# Patient Record
Sex: Male | Born: 1955
Health system: Southern US, Community
[De-identification: ages and names within clinical notes are randomized; demographics above are authoritative.]

## PROBLEM LIST (undated history)

## (undated) DIAGNOSIS — G459 Transient cerebral ischemic attack, unspecified: Secondary | ICD-10-CM

## (undated) DIAGNOSIS — I2510 Atherosclerotic heart disease of native coronary artery without angina pectoris: Secondary | ICD-10-CM

## (undated) DIAGNOSIS — E785 Hyperlipidemia, unspecified: Secondary | ICD-10-CM

---

## 2006-07-06 ENCOUNTER — Encounter: Admission: RE | Admit: 2006-07-06 | Discharge: 2006-07-06 | Payer: Self-pay | Admitting: Family Medicine

## 2006-07-20 ENCOUNTER — Ambulatory Visit: Payer: Self-pay

## 2006-07-20 ENCOUNTER — Encounter: Payer: Self-pay | Admitting: Cardiology

## 2006-09-22 ENCOUNTER — Ambulatory Visit: Payer: Self-pay | Admitting: Cardiology

## 2006-10-13 ENCOUNTER — Ambulatory Visit: Payer: Self-pay | Admitting: Cardiology

## 2006-10-13 ENCOUNTER — Ambulatory Visit: Payer: Self-pay

## 2006-10-13 ENCOUNTER — Encounter: Payer: Self-pay | Admitting: Cardiology

## 2006-10-13 LAB — CONVERTED CEMR LAB
BUN: 12 mg/dL (ref 6–23)
Basophils Relative: 0.3 % (ref 0.0–1.0)
CO2: 33 meq/L — ABNORMAL HIGH (ref 19–32)
Creatinine, Ser: 1.3 mg/dL (ref 0.4–1.5)
HCT: 42.1 % (ref 39.0–52.0)
Hemoglobin: 15 g/dL (ref 13.0–17.0)
Monocytes Absolute: 0.4 10*3/uL (ref 0.2–0.7)
Neutrophils Relative %: 65.4 % (ref 43.0–77.0)
Potassium: 3.8 meq/L (ref 3.5–5.1)
RDW: 11.7 % (ref 11.5–14.6)
Sodium: 144 meq/L (ref 135–145)

## 2006-10-15 ENCOUNTER — Inpatient Hospital Stay (HOSPITAL_BASED_OUTPATIENT_CLINIC_OR_DEPARTMENT_OTHER): Admission: RE | Admit: 2006-10-15 | Discharge: 2006-10-15 | Payer: Self-pay | Admitting: Cardiovascular Disease

## 2006-10-15 ENCOUNTER — Ambulatory Visit: Payer: Self-pay | Admitting: Cardiovascular Disease

## 2006-11-03 ENCOUNTER — Ambulatory Visit: Payer: Self-pay | Admitting: Cardiology

## 2010-08-20 NOTE — Assessment & Plan Note (Signed)
[facility]                            CARDIOLOGY OFFICE NOTE   EDMON, MAGID                        MRN:          [id number]  DATE:10/13/2006                            DOB:          [birth year]    REASON FOR VISIT:  Follow up exercise echocardiogram.   HISTORY OF PRESENT ILLNESS:  I saw Mr. Reister back in June for  cardiovascular evaluation.  He is a 55 year old male with a family  history of premature cardiovascular disease, as well as personal history  of previous cerebral vascular disease, and hyperlipidemia.  He had no  major progressive symptomatology, but was referred for a baseline  cardiac risk assessment with an exercise echocardiogram.  This study was  performed earlier today, and interpreted by Dr. Myrtis Ser.  Results indicate  abnormal ST segment changes in late exercise including ST segment  depression, which was fairly short lived, essentially nondiagnostic by  30 seconds in recovery.  This was in the absence of chest pain.  A  hypertensive response was noted with a systolic of 182.  There is also  description of possible ST elevation in lead AVL, but this is fairly  equivocal.  The echocardiographic images were reported to show abnormal  septal motion with stress, and no clear decrease in chamber size with  activity.  I reviewed these results with the patient today in the  office.  He continues to deny any major problems with exertional chest  pain or dyspnea.  He reports only occasional indigestion.  In light of  these findings, we discussed proceeding on to a diagnostic cardiac  catheterization to clearly outline his coronary anatomy.  We discussed  the potential risks and benefits, and he is in agreement to proceed.  This will be scheduled at his earliest convenience, which at this point  he states is Thursday.   ALLERGIES:  NO KNOWN DRUG ALLERGIES.   PRESENT MEDICATIONS:  Include:  1. CoQ10.  2. Crestor 20 mg p.o. daily.  3.  Aspirin 81 mg 2 tablets p.o. daily.   REVIEW OF SYSTEMS:  As per history of present illness, otherwise  negative.   PAST MEDICAL HISTORY:  Reviewed in my previous note.  There has been  other major interval change..   SOCIAL HISTORY:  Reviewed in my previous note.  There has been no other  major interval change.   FAMILY HISTORY:  Reviewed in my previous note.  There has been no other  major interval change.   EXAMINATION:  Blood pressure is 139/87.  Heart rate is 62.  Weight is  187 pounds.  Patient is comfortable without any active chest pain or dyspnea.  There  has been no significant change in his examination compared to the  previous documentation.   IMPRESSION AND RECOMMENDATIONS:  1. Abnormal screening exercise echocardiogram in a 55 year old male      with family history of premature cardiovascular disease.  2. Hyperlipidemia.  3. Possible cerebrovascular disease based on previous testing.   We have discussed the issues, and plan is for a diagnostic cardiac  catheterization to clearly  outline the coronary anatomy and assess for  any potential revascularization options.  I reviewed the risks and  benefits with him, and he is in agreement to proceed.  We will plan  baseline blood work and a chest x-ray, and schedule cardiac  catheterization for this week at the patient's earliest convenience.  He  will continue his present medications, and we have provided him with a  prescription of sublingual nitroglycerin as well.  Further plans to  follow based on subsequent testing.     Jonelle Sidle, MD  Electronically Signed    SGM/MedQ  DD: 10/13/2006  DT: 10/13/2006  Job #: [id number]   cc:   Marye Round, M.D.

## 2010-08-20 NOTE — Assessment & Plan Note (Signed)
[facility]                            CARDIOLOGY OFFICE NOTE   Patrick [name], Patrick [name]                        MRN:          [id number]  DATE:09/22/2006                            DOB:          Feb 11, 1956    REFERRING PHYSICIAN:  Jeanice Lim, M.D.   REASON FOR CONSULTATION:  Cardiovascular evaluation.   HISTORY OF PRESENT ILLNESS:  Patrick [name] is a pleasant 55 year old male  with a history of possible cerebrovascular disease.  He apparently had  an event approximately 6 months ago where he had transient brief problem  with speech disturbance.  This led to a neurological evaluation which  reportedly showed evidence of a possible old stroke but nothing acute  and no major obstructive carotid stenoses.  He has been managed with  aspirin and lipid therapy and was noted to have significant  hyperlipidemia with a total cholesterol of 301, triglycerides of 513,  and ACL of 39.  He reports having a standard treadmill test as part of a  routine physical approximately 7 years ago when he was living in  [place], [place].  Symptomatically he denies having any exertional  chest pain or dyspnea.  He runs a few miles at a time once or twice a  week without problems and works out with [employer].  He has had no further  cardiac risk stratification and does have a family history of  cardiovascular disease including his father, who underwent bypass  surgery at age 74.   ALLERGIES:  No known drug allergies.   PRESENT MEDICATIONS:  1. Co-Q-10.  2. Crestor 20 mg p.o. daily.  3. Aspirin 81 mg 2 tabs p.o. daily.   PAST MEDICAL HISTORY:  1. As outlined above.  2. The patient is status post previous lithotripsy.   SOCIAL HISTORY:  The patient is going through a divorce at this time.  He has 3 children.  He works in [employer].  He has a  prior tobacco use history but quit in 1996, drinks approximately 21  beers a day.  Two caffeinated beverages a day  also.   REVIEW OF SYSTEMS:  As described in history of present illness.  All  other is negative.   EXAMINATION:  VITAL SIGNS:  Blood pressure today was elevated at 167/84.  Heart rate 52, weight 185 pounds; a well-nourished, well-developed male  in no acute distress.  HEENT:  Conjunctivae was normal.  Pharynx is clear.  NECK:  Supple.  No elevated jugular venous pressure or loud bruits.  No  thyromegaly is noted.  LUNGS:  Clear without labored breathing.  CARDIAC:  Regular rate and rhythm without murmur, S3 gallop, or  pericardial rub.  ABDOMEN:  Soft, nontender, normoactive bowel sounds.  EXTREMITIES:  No significant pitting edema.  SKIN:  Warm and dry.  Distal pulses are 2+.  MUSCULOSKELETAL:  No kyphosis is noted.  PSYCHIATRIC:  Alert and oriented x3.  Affect is normal.   IMPRESSION AND RECOMMENDATIONS:  36. A 55 year old male with hyperlipidemia, apparent cerebrovascular      disease based on the previous workup and  family history of      premature cardiovascular disease.  He is not manifesting active      symptoms, but he is at increased risk for cardiac event based on      his risk factor profile.  His last assessment was approximately 7      years ago with a standard treadmill test.  Our plan will be an      exercise echocardiogram for a repeat risk assessment.  If this is a      low risk study, I would continue aggressive risk factor      modification.  Otherwise, we will plan to see him back and discuss      further evaluation.  2. Further plans to follow.     Jonelle Sidle, MD  Electronically Signed    SGM/MedQ  DD: 09/22/2006  DT: 09/22/2006  Job #: [id number]   cc:   Jeanice Lim, M.D.

## 2010-08-20 NOTE — Cardiovascular Report (Signed)
NAME:  [name], [name] NO.:  [id number]   MEDICAL RECORD NO.:  [id number]          PATIENT TYPE:  OIB   LOCATION:  1962                         FACILITY:  [facility]   PHYSICIAN:  Veverly Fells. Excell Seltzer, MD  DATE OF BIRTH:  [birth year]   DATE OF PROCEDURE:  10/15/2006  DATE OF DISCHARGE:                            CARDIAC CATHETERIZATION   PROCEDURE:  Left heart catheterization, selective coronary angiography,  left ventricular angiography.   INDICATION:  Patrick [name] is a 55 year old gentleman who underwent  functional testing with an exercise echocardiogram.  He developed ST  depression with exercise that recovered quickly.  He did not have chest  pain.  There was a subtle abnormality in the septum with stress.  He was  referred for a cardiac catheterization in the setting of his abnormal  functional study.   Risks and indications of the procedure were explained to the patient.  Informed consent was obtained.  The right groin was prepped, draped and  anesthetized with 1% lidocaine.  Using modified Seldinger technique, a 4-  French sheath was placed in the right femoral artery.  Multiple views of  the left and right coronary arteries were taken using standard preformed  4-French catheters.  Following selective angiography an angled pigtail  catheter was inserted into the left ventricle and pressures were  recorded.  A left ventriculogram was performed and pullback across the  aortic valve was done.  All catheter exchanges were performed over a  guidewire.  There were no immediate complications.   FINDINGS:  Aortic pressure 120/68 with a mean of 91, left ventricular  pressure is 119/4.   CORONARY ANGIOGRAPHY:  The left mainstem is calcified.  There is no  significant angiographic disease.  It bifurcates into the LAD and left  circumflex.   The LAD is a large-caliber vessel that courses down and wraps around the  left ventricular apex.  The proximal and mid LAD is  calcified.  The  proximal LAD has a smooth 30-50% stenosis just beyond the first diagonal  branch.  The LAD then has diffuse nonobstructive disease throughout its  midportions with a focal calcified 30-40% stenosis in the mid vessel.  Down in the apical portion of the LAD there is a focal 70-80% stenosis,  but this is very distal in the vessel.  The first diagonal branch arises  from the very proximal aspect of the LAD and is a medium-size vessel  that has no significant angiographic disease.   The left circumflex is a medium to large-size vessel.  It has diffuse  moderate stenosis with calcification throughout the proximal and mid  aspect.  It appears in the range of 70%.  The circumflex is also  tortuous in this area.  There is a very small first OM branch, followed  by a small second OM branch.  There are two large left posterolateral  branches supplied by the circumflex.  At the bifurcation of the left  posterolateral branches, there is a 30% stenosis there.  There is no  significant angiographic disease in the branch vessels themselves.   The  right coronary artery is a small vessel.  By angiography it appears  to be no more than 2 mm in size.  There is diffuse disease involving the  proximal and midportions.  The most significant lesion in the vessel was  in the midportion and is 70-80%.  It is a focal lesion.  Further down  the vessel in the distal RCA there is a 50% stenosis.  The RCA distally  bifurcates into a PDA branch and posterior AV segment that supplies two  small posterolateral branches.   Left ventriculography demonstrates normal LV systolic function with no  mitral regurgitation.  The LVEF is 60%.   ASSESSMENT:  1. Moderate three-vessel coronary artery disease.  2. Normal left ventricular systolic function.   PLAN:  I will review the findings with Dr. Diona Browner.  I would favor  treatment with medical therapy at this point.  The diffuse nature of the  disease as  well as the fact that there are no critical lesions would  favor medical therapy, especially as the patient is minimally  symptomatic.  He was able to exercise for 12 minutes according to the  Bruce protocol and while he had some evidence of ischemia, he did not  have high-risk features of his stress test.  He is currently taking an  aspirin and statin, and I will take the liberty of starting him on a  beta blocker today with close follow-up by Dr. Diona Browner.      Veverly Fells. Excell Seltzer, MD  Electronically Signed     MDC/MEDQ  D:  10/15/2006  T:  10/15/2006  Job:  [id number]   cc:   Patrick Sidle, MD  Patrick Round, MD

## 2010-08-20 NOTE — Assessment & Plan Note (Signed)
[facility]                            CARDIOLOGY OFFICE NOTE   Patrick [name]                        MRN:          [id number]  DATE:11/03/2006                            DOB:          10/10/1955    PRIMARY CARE PHYSICIAN:  Jeanice Lim, M.D.   REASON FOR VISIT:  Follow-up coronary artery disease.   HISTORY OF PRESENT ILLNESS:  I referred Patrick [name] for a diagnostic  cardiac catheterization following his visit in early July.  He had  undergone an exercise echocardiogram  which was abnormal in the setting  of a family history of premature cerebrovascular disease as well as  hyperlipidemia and previous cerebrovascular accident.  He had not had  any symptoms of active angina, however.  Dr. Excell Seltzer performed his  procedure on October 15, 2006, and this revealed moderate multivessel  coronary artery disease, although there were no critical stenoses  requiring urgent intervention, particularly in light of his asymptomatic  status.  He had no significant left main disease, 30-50% stenosis within  the left anterior descending followed by 30-40% stenosis and a focal  area of 70-80% stenosis in the apical portion of the vessel.  The  circumflex vessel had disease up to approximately 70% and the right  coronary artery had a 70-80% stenosis that was quite focal, otherwise  50% disease.  It was a relatively small vessel in caliber.  Ejection  fraction was 60%.  I reviewed these findings with the patient again  today.  He states that overall he has done fairly well.  He has noticed  more fatigue and exercise limitations since being started on metoprolol.  His resting heart rate before this medication was in the 60s and now it  is in the low 50s.  I reviewed risk factor modification, particularly  focused on antiplatelet therapy and lipid control.  He is due to have  follow-up lipids and liver function tests in a few months with his  primary care Patrick [name].   ALLERGIES:  NO KNOWN DRUG ALLERGIES.   PRESENT MEDICATIONS:  1. Aspirin 81 mg p.o. b.i.d.  2. Crestor 20 mg p.o. daily.  3. Co-Q 10 supplements.  4. Metoprolol 25 mg p.o. b.i.d.  5. Sublingual nitroglycerin 0.4 mg p.r.n.   REVIEW OF SYSTEMS:  As described in history of present illness.   PHYSICAL EXAMINATION:  VITAL SIGNS:  Blood pressure 122/69, heart rate  52, weight 187 pounds.  GENERAL APPEARANCE:  Patient is comfortable, appropriately nourished, in  no acute distress.  NECK:  No elevated jugular venous pressure, no bruits, no thyromegaly is  noted.  LUNGS:  Clear without labored breathing at rest.  CARDIOVASCULAR:  Regular rate and rhythm, no loud murmur or gallop.  EXTREMITIES:  No significant pitting or edema.   IMPRESSION/RECOMMENDATIONS:  1. Moderate multivessel coronary artery disease with preserved      ejection fraction.  Plan at this point is medical therapy,      particularly in light of no major symptoms of angina or dyspnea on      exertion.  I have asked  that he wean back his metoprolol to 12.5 mg      p.o. b.i.d. for a few days and then discontinue the medicine all      together.  He will otherwise continue aspirin and Crestor.  We also      talked about Omega III supplements.  I would recommend that he have      a follow-up lipid panel with liver function tests over the next      eight weeks and aim for LDL control around 70.  Otherwise, I would      like to see him back over the next six months for symptom review.  2. Further plans to follow.     Jonelle Sidle, MD  Electronically Signed    SGM/MedQ  DD: 11/03/2006  DT: 11/04/2006  Job #: [id number]   cc:   Patrick [name], M.D.

## 2016-01-05 ENCOUNTER — Emergency Department (HOSPITAL_COMMUNITY): Payer: Managed Care, Other (non HMO)

## 2016-01-05 ENCOUNTER — Inpatient Hospital Stay (HOSPITAL_COMMUNITY): Payer: Managed Care, Other (non HMO)

## 2016-01-05 ENCOUNTER — Inpatient Hospital Stay (HOSPITAL_COMMUNITY)
Admission: EM | Admit: 2016-01-05 | Discharge: 2016-01-22 | DRG: 082 | Disposition: A | Discharge status: Rehabilitation Facility | Payer: Managed Care, Other (non HMO) | Attending: Surgery | Admitting: Surgery

## 2016-01-05 ENCOUNTER — Encounter (HOSPITAL_COMMUNITY): Payer: Self-pay | Admitting: Emergency Medicine

## 2016-01-05 DIAGNOSIS — S02119A Unspecified fracture of occiput, initial encounter for closed fracture: Secondary | ICD-10-CM | POA: Diagnosis present

## 2016-01-05 DIAGNOSIS — Z7982 Long term (current) use of aspirin: Secondary | ICD-10-CM

## 2016-01-05 DIAGNOSIS — Y908 Blood alcohol level of 240 mg/100 ml or more: Secondary | ICD-10-CM | POA: Diagnosis present

## 2016-01-05 DIAGNOSIS — G9389 Other specified disorders of brain: Secondary | ICD-10-CM | POA: Diagnosis present

## 2016-01-05 DIAGNOSIS — F10129 Alcohol abuse with intoxication, unspecified: Secondary | ICD-10-CM | POA: Diagnosis present

## 2016-01-05 DIAGNOSIS — G936 Cerebral edema: Secondary | ICD-10-CM | POA: Diagnosis present

## 2016-01-05 DIAGNOSIS — S02113A Unspecified occipital condyle fracture, initial encounter for closed fracture: Secondary | ICD-10-CM | POA: Diagnosis present

## 2016-01-05 DIAGNOSIS — G510 Bell's palsy: Secondary | ICD-10-CM | POA: Diagnosis not present

## 2016-01-05 DIAGNOSIS — S02109A Fracture of base of skull, unspecified side, initial encounter for closed fracture: Secondary | ICD-10-CM | POA: Diagnosis present

## 2016-01-05 DIAGNOSIS — S066X9A Traumatic subarachnoid hemorrhage with loss of consciousness of unspecified duration, initial encounter: Secondary | ICD-10-CM | POA: Diagnosis present

## 2016-01-05 DIAGNOSIS — S12201A Unspecified nondisplaced fracture of third cervical vertebra, initial encounter for closed fracture: Secondary | ICD-10-CM | POA: Diagnosis present

## 2016-01-05 DIAGNOSIS — Z23 Encounter for immunization: Secondary | ICD-10-CM

## 2016-01-05 DIAGNOSIS — Z8673 Personal history of transient ischemic attack (TIA), and cerebral infarction without residual deficits: Secondary | ICD-10-CM

## 2016-01-05 DIAGNOSIS — S062X9A Diffuse traumatic brain injury with loss of consciousness of unspecified duration, initial encounter: Secondary | ICD-10-CM | POA: Diagnosis not present

## 2016-01-05 DIAGNOSIS — E785 Hyperlipidemia, unspecified: Secondary | ICD-10-CM | POA: Diagnosis present

## 2016-01-05 DIAGNOSIS — Z87891 Personal history of nicotine dependence: Secondary | ICD-10-CM | POA: Diagnosis not present

## 2016-01-05 DIAGNOSIS — G960 Cerebrospinal fluid leak: Secondary | ICD-10-CM | POA: Diagnosis not present

## 2016-01-05 DIAGNOSIS — I2510 Atherosclerotic heart disease of native coronary artery without angina pectoris: Secondary | ICD-10-CM | POA: Diagnosis present

## 2016-01-05 DIAGNOSIS — M542 Cervicalgia: Secondary | ICD-10-CM

## 2016-01-05 DIAGNOSIS — Z809 Family history of malignant neoplasm, unspecified: Secondary | ICD-10-CM

## 2016-01-05 DIAGNOSIS — S022XXA Fracture of nasal bones, initial encounter for closed fracture: Secondary | ICD-10-CM | POA: Diagnosis present

## 2016-01-05 DIAGNOSIS — S02609A Fracture of mandible, unspecified, initial encounter for closed fracture: Secondary | ICD-10-CM | POA: Diagnosis present

## 2016-01-05 DIAGNOSIS — G441 Vascular headache, not elsewhere classified: Secondary | ICD-10-CM | POA: Diagnosis not present

## 2016-01-05 DIAGNOSIS — S12501A Unspecified nondisplaced fracture of sixth cervical vertebra, initial encounter for closed fracture: Secondary | ICD-10-CM | POA: Diagnosis present

## 2016-01-05 DIAGNOSIS — G519 Disorder of facial nerve, unspecified: Secondary | ICD-10-CM | POA: Diagnosis not present

## 2016-01-05 DIAGNOSIS — W109XXA Fall (on) (from) unspecified stairs and steps, initial encounter: Secondary | ICD-10-CM | POA: Diagnosis present

## 2016-01-05 DIAGNOSIS — W19XXXA Unspecified fall, initial encounter: Secondary | ICD-10-CM | POA: Diagnosis present

## 2016-01-05 DIAGNOSIS — S0292XA Unspecified fracture of facial bones, initial encounter for closed fracture: Secondary | ICD-10-CM | POA: Diagnosis present

## 2016-01-05 DIAGNOSIS — S02109S Fracture of base of skull, unspecified side, sequela: Secondary | ICD-10-CM | POA: Diagnosis not present

## 2016-01-05 DIAGNOSIS — Z8249 Family history of ischemic heart disease and other diseases of the circulatory system: Secondary | ICD-10-CM

## 2016-01-05 DIAGNOSIS — G9601 Cranial cerebrospinal fluid leak, spontaneous: Secondary | ICD-10-CM

## 2016-01-05 DIAGNOSIS — S0990XA Unspecified injury of head, initial encounter: Secondary | ICD-10-CM | POA: Diagnosis present

## 2016-01-05 DIAGNOSIS — S066X1A Traumatic subarachnoid hemorrhage with loss of consciousness of 30 minutes or less, initial encounter: Secondary | ICD-10-CM

## 2016-01-05 DIAGNOSIS — R269 Unspecified abnormalities of gait and mobility: Secondary | ICD-10-CM

## 2016-01-05 DIAGNOSIS — S02101S Fracture of base of skull, right side, sequela: Secondary | ICD-10-CM | POA: Diagnosis not present

## 2016-01-05 DIAGNOSIS — S069X1A Unspecified intracranial injury with loss of consciousness of 30 minutes or less, initial encounter: Secondary | ICD-10-CM | POA: Diagnosis not present

## 2016-01-05 DIAGNOSIS — F1010 Alcohol abuse, uncomplicated: Secondary | ICD-10-CM | POA: Diagnosis present

## 2016-01-05 DIAGNOSIS — I609 Nontraumatic subarachnoid hemorrhage, unspecified: Secondary | ICD-10-CM

## 2016-01-05 DIAGNOSIS — R52 Pain, unspecified: Secondary | ICD-10-CM | POA: Diagnosis not present

## 2016-01-05 HISTORY — DX: Transient cerebral ischemic attack, unspecified: G45.9

## 2016-01-05 HISTORY — DX: Hyperlipidemia, unspecified: E78.5

## 2016-01-05 HISTORY — DX: Atherosclerotic heart disease of native coronary artery without angina pectoris: I25.10

## 2016-01-05 LAB — CBC WITH DIFFERENTIAL/PLATELET
BASOS ABS: 0 10*3/uL (ref 0.0–0.1)
Basophils Relative: 0 %
EOS ABS: 0.1 10*3/uL (ref 0.0–0.7)
EOS PCT: 1 %
HCT: 43.6 % (ref 39.0–52.0)
Hemoglobin: 14.3 g/dL (ref 13.0–17.0)
LYMPHS PCT: 39 %
Lymphs Abs: 2.7 10*3/uL (ref 0.7–4.0)
MCH: 29.9 pg (ref 26.0–34.0)
MCHC: 32.8 g/dL (ref 30.0–36.0)
MCV: 91.2 fL (ref 78.0–100.0)
MONO ABS: 0.4 10*3/uL (ref 0.1–1.0)
Monocytes Relative: 5 %
Neutro Abs: 3.8 10*3/uL (ref 1.7–7.7)
Neutrophils Relative %: 55 %
PLATELETS: 178 10*3/uL (ref 150–400)
RBC: 4.78 MIL/uL (ref 4.22–5.81)
RDW: 12.9 % (ref 11.5–15.5)
WBC: 6.9 10*3/uL (ref 4.0–10.5)

## 2016-01-05 LAB — COMPREHENSIVE METABOLIC PANEL
ALT: 16 U/L — ABNORMAL LOW (ref 17–63)
ANION GAP: 8 (ref 5–15)
AST: 25 U/L (ref 15–41)
Albumin: 4.4 g/dL (ref 3.5–5.0)
Alkaline Phosphatase: 46 U/L (ref 38–126)
BUN: 13 mg/dL (ref 6–20)
CHLORIDE: 111 mmol/L (ref 101–111)
CO2: 23 mmol/L (ref 22–32)
Calcium: 8.8 mg/dL — ABNORMAL LOW (ref 8.9–10.3)
Creatinine, Ser: 0.99 mg/dL (ref 0.61–1.24)
Glucose, Bld: 162 mg/dL — ABNORMAL HIGH (ref 65–99)
POTASSIUM: 3.7 mmol/L (ref 3.5–5.1)
SODIUM: 142 mmol/L (ref 135–145)
TOTAL PROTEIN: 7.1 g/dL (ref 6.5–8.1)
Total Bilirubin: 0.7 mg/dL (ref 0.3–1.2)

## 2016-01-05 LAB — URINALYSIS, ROUTINE W REFLEX MICROSCOPIC
BILIRUBIN URINE: NEGATIVE
GLUCOSE, UA: NEGATIVE mg/dL
Hgb urine dipstick: NEGATIVE
KETONES UR: NEGATIVE mg/dL
Leukocytes, UA: NEGATIVE
Nitrite: NEGATIVE
PH: 6 (ref 5.0–8.0)
PROTEIN: NEGATIVE mg/dL
Specific Gravity, Urine: 1.011 (ref 1.005–1.030)

## 2016-01-05 LAB — MRSA PCR SCREENING: MRSA by PCR: NEGATIVE

## 2016-01-05 LAB — ETHANOL: ALCOHOL ETHYL (B): 285 mg/dL — AB (ref <5)

## 2016-01-05 MED ORDER — ONDANSETRON HCL 4 MG/2ML IJ SOLN
4.0000 mg | Freq: Once | INTRAMUSCULAR | Status: AC
  Administered 2016-01-05: 4 mg via INTRAVENOUS
  Filled 2016-01-05: qty 2

## 2016-01-05 MED ORDER — ONDANSETRON HCL 4 MG/2ML IJ SOLN
4.0000 mg | Freq: Four times a day (QID) | INTRAMUSCULAR | Status: DC | PRN
  Administered 2016-01-05 – 2016-01-06 (×2): 4 mg via INTRAVENOUS
  Filled 2016-01-05 (×3): qty 2

## 2016-01-05 MED ORDER — MORPHINE SULFATE (PF) 2 MG/ML IV SOLN
2.0000 mg | INTRAVENOUS | Status: DC | PRN
  Administered 2016-01-05: 2 mg via INTRAVENOUS
  Administered 2016-01-05 (×3): 4 mg via INTRAVENOUS
  Administered 2016-01-05 (×2): 2 mg via INTRAVENOUS
  Administered 2016-01-06 – 2016-01-07 (×14): 4 mg via INTRAVENOUS
  Filled 2016-01-05 (×5): qty 2
  Filled 2016-01-05: qty 1
  Filled 2016-01-05 (×3): qty 2
  Filled 2016-01-05 (×2): qty 1
  Filled 2016-01-05 (×9): qty 2

## 2016-01-05 MED ORDER — ONDANSETRON HCL 4 MG/2ML IJ SOLN
INTRAMUSCULAR | Status: AC
  Administered 2016-01-05: 4 mg
  Filled 2016-01-05: qty 2

## 2016-01-05 MED ORDER — POTASSIUM CHLORIDE IN NACL 20-0.9 MEQ/L-% IV SOLN
INTRAVENOUS | Status: DC
  Administered 2016-01-05 – 2016-01-09 (×7): via INTRAVENOUS
  Filled 2016-01-05 (×9): qty 1000

## 2016-01-05 MED ORDER — FENTANYL CITRATE (PF) 100 MCG/2ML IJ SOLN
25.0000 ug | Freq: Once | INTRAMUSCULAR | Status: AC
  Administered 2016-01-05: 25 ug via INTRAVENOUS
  Filled 2016-01-05: qty 2

## 2016-01-05 MED ORDER — ONDANSETRON HCL 4 MG PO TABS: 4.0000 mg | ORAL_TABLET | Freq: Four times a day (QID) | ORAL | Status: DC | PRN

## 2016-01-05 NOTE — Consult Note (Signed)
Reason for Consult:Facial fractures Referring Physician: Dr. Verita Lamb  Patrick [name] is an 60 y.o. male.  HPI: The patient is a 60 yrs old wm here with his wife after a fall down the steps.  He is reported to have been intoxicated and fell on his face.  He was brought to the ED last night.  He is now in the ICU under neurosurgical evaluation.  He has marked bruising and swelling of his periorbital area.  He is sleepy but responds appropriately.  EOM intact. Denies malocclusion or face pain.  CT reviewed and includes palate and TMJ fractures with maxillary sinus.  Past Medical History:  Diagnosis Date  . Hyperlipidemia     History reviewed. No pertinent surgical history.  No family history on file.  Social History:  reports that he has never smoked. He has never used smokeless tobacco. He reports that he drinks alcohol. He reports that he does not use drugs.  ROS: per wife  Allergies: No Known Allergies  Medications: I have reviewed the patient's current medications.  Results for orders placed or performed during the hospital encounter of 01/05/16 (from the past 48 hour(s))  Comprehensive metabolic panel     Status: Abnormal   Collection Time: 01/05/16  2:44 AM  Result Value Ref Range   Sodium 142 135 - 145 mmol/L   Potassium 3.7 3.5 - 5.1 mmol/L   Chloride 111 101 - 111 mmol/L   CO2 23 22 - 32 mmol/L   Glucose, Bld 162 (H) 65 - 99 mg/dL   BUN 13 6 - 20 mg/dL   Creatinine, Ser 0.99 0.61 - 1.24 mg/dL   Calcium 8.8 (L) 8.9 - 10.3 mg/dL   Total Protein 7.1 6.5 - 8.1 g/dL   Albumin 4.4 3.5 - 5.0 g/dL   AST 25 15 - 41 U/L   ALT 16 (L) 17 - 63 U/L   Alkaline Phosphatase 46 38 - 126 U/L   Total Bilirubin 0.7 0.3 - 1.2 mg/dL   GFR calc non Af Amer >60 >60 mL/min   GFR calc Af Amer >60 >60 mL/min    Comment: (NOTE) The eGFR has been calculated using the CKD EPI equation. This calculation has not been validated in all clinical situations. eGFR's persistently <60 mL/min  signify possible Chronic Kidney Disease.    Anion gap 8 5 - 15  CBC with Differential     Status: None   Collection Time: 01/05/16  2:44 AM  Result Value Ref Range   WBC 6.9 4.0 - 10.5 K/uL   RBC 4.78 4.22 - 5.81 MIL/uL   Hemoglobin 14.3 13.0 - 17.0 g/dL   HCT 43.6 39.0 - 52.0 %   MCV 91.2 78.0 - 100.0 fL   MCH 29.9 26.0 - 34.0 pg   MCHC 32.8 30.0 - 36.0 g/dL   RDW 12.9 11.5 - 15.5 %   Platelets 178 150 - 400 K/uL   Neutrophils Relative % 55 %   Neutro Abs 3.8 1.7 - 7.7 K/uL   Lymphocytes Relative 39 %   Lymphs Abs 2.7 0.7 - 4.0 K/uL   Monocytes Relative 5 %   Monocytes Absolute 0.4 0.1 - 1.0 K/uL   Eosinophils Relative 1 %   Eosinophils Absolute 0.1 0.0 - 0.7 K/uL   Basophils Relative 0 %   Basophils Absolute 0.0 0.0 - 0.1 K/uL  Ethanol     Status: Abnormal   Collection Time: 01/05/16  2:44 AM  Result Value Ref Range  Alcohol, Ethyl (B) 285 (H) <5 mg/dL    Comment:        LOWEST DETECTABLE LIMIT FOR SERUM ALCOHOL IS 5 mg/dL FOR MEDICAL PURPOSES ONLY   Urinalysis, Routine w reflex microscopic     Status: None   Collection Time: 01/05/16  2:48 AM  Result Value Ref Range   Color, Urine YELLOW YELLOW   APPearance CLEAR CLEAR   Specific Gravity, Urine 1.011 1.005 - 1.030   pH 6.0 5.0 - 8.0   Glucose, UA NEGATIVE NEGATIVE mg/dL   Hgb urine dipstick NEGATIVE NEGATIVE   Bilirubin Urine NEGATIVE NEGATIVE   Ketones, ur NEGATIVE NEGATIVE mg/dL   Protein, ur NEGATIVE NEGATIVE mg/dL   Nitrite NEGATIVE NEGATIVE   Leukocytes, UA NEGATIVE NEGATIVE    Comment: MICROSCOPIC NOT DONE ON URINES WITH NEGATIVE PROTEIN, BLOOD, LEUKOCYTES, NITRITE, OR GLUCOSE <1000 mg/dL.    Ct Head Wo Contrast  Result Date: 01/05/2016 CLINICAL DATA:  Trip and fall injury down steps. Injuries to the head. Increasingly lethargic since the fall. Alcohol use. EXAM: CT HEAD WITHOUT CONTRAST CT MAXILLOFACIAL WITHOUT CONTRAST CT CERVICAL SPINE WITHOUT CONTRAST TECHNIQUE: Multidetector CT imaging of the head,  cervical spine, and maxillofacial structures were performed using the standard protocol without intravenous contrast. Multiplanar CT image reconstructions of the cervical spine and maxillofacial structures were also generated. COMPARISON:  None. FINDINGS: CT HEAD FINDINGS Brain: Pneumocephalus in the skullbase. Acute subarachnoid hemorrhage filling suprasellar cisterns and anterior basal subarachnoid spaces. Can't exclude small intraparenchymal hematomas around the periphery. No mass effect or midline shift. Gray-white matter junctions are distinct. Basal cisterns are not effaced. Ventricles are not dilated. Vascular: No hyperdense vessel or unexpected calcification. Skull: Mildly depressed basal skull fractures involving the greater wing of the sphenoid bone extending to the anterior temporal bone at the temporomandibular joint. Fractures across the base of the clivus extending from right to left. Non depressed fractures of the left sphenoid bone involving the greater wing, extending to the sphenoid sinus and to the temporomandibular joint. Calvarium appears intact. Sinuses/Orbits: See below. Mastoid air cells are not opacified. External and middle ear cavities are not opacified. Other: Examination is technically limited due to significant motion artifact throughout the examination. CT MAXILLOFACIAL FINDINGS Osseous: Depressed anterior nasal bone fractures. Fractures of the medial orbital walls bilaterally and of the left superior and inferior orbital walls. Fractures of the lateral, anterior, and inferior maxillary antral walls bilaterally. Fractures of the pterygoid plates on the right. Displaced fractures of the posterior nasal septum. Fracture of the right hard palate and maxilla. Mandibles and temporomandibular joints appear intact. Orbits: Globes and extraocular muscles appear intact and symmetrical. The no evidence of herniation or entrapment of the extraocular muscles. Bilateral periorbital hematomas and  emphysematous changes. Bilateral extraconal retrobulbar emphysema, greater on the left. Sinuses: Opacification of the left frontal, bilateral ethmoid, bilateral sphenoid, and bilateral maxillary sinuses. Soft tissues: Prominent soft tissue swelling and subcutaneous emphysema in the facial regions inferior to the orbits and along the cheeks bilaterally. Soft tissue emphysema demonstrated in the masticator muscle spaces and along the right mandible. Limited intracranial: See above. Other:  Examination is limited due to motion artifact. CT CERVICAL SPINE FINDINGS Alignment: Normal Skull base and vertebrae: Fractures of the skullbase as previously discussed. No vertebral compression deformities. Ununited ossicles are demonstrated at the anterior inferior endplate of C3 and at the anterior superior endplate of C6 which are not definitely corticated and could represent corner fractures. The posterior elements appear intact. C1-2 articulation appears intact.  Soft tissues and spinal canal: Gas bubbles demonstrated within the central canal at the level of C2 through C5. This likely represents gas within the CSF space, tracking down from the intracranial contents. No prevertebral soft tissue swelling. No central canal hematoma demonstrated. Disc levels: Mild degenerative changes in the cervical spine with mild endplate hypertrophic changes present. Upper chest: Emphysematous changes in the lung apices. Other: Examination is limited due to motion artifact. IMPRESSION: CT head: Acute intracranial subarachnoid and possibly intraparenchymal hemorrhages along the anterior frontal region. Acute hemorrhage in the suprasellar cisterns. Pneumocephalus. No mass-effect or midline shift. CT maxillary: Fractures as described throughout the skull base involving the sphenoid bones, clivus, and temporomandibular joints. Fractures of bilateral orbits and maxillary antral walls as described. Fractures also involve the pterygoid plates on the  right. Associated soft tissue hematomas and soft tissue emphysema in the orbital and facial regions as described. CT cervical spine: Normal alignment. No acute displaced fractures, but cannot exclude anterior superior endplate corner fracture at C6 and anterior inferior endplate on a fracture at C3. Gas within the spinal canal, probably within the CSF space. These results were called by telephone at the time of interpretation on 01/05/2016 at 3:49 am to Dr. Quintella Reichert , who verbally acknowledged these results. Electronically Signed   By: Lucienne Capers M.D.   On: 01/05/2016 04:09   Ct Cervical Spine Wo Contrast  Result Date: 01/05/2016 CLINICAL DATA:  Trip and fall injury down steps. Injuries to the head. Increasingly lethargic since the fall. Alcohol use. EXAM: CT HEAD WITHOUT CONTRAST CT MAXILLOFACIAL WITHOUT CONTRAST CT CERVICAL SPINE WITHOUT CONTRAST TECHNIQUE: Multidetector CT imaging of the head, cervical spine, and maxillofacial structures were performed using the standard protocol without intravenous contrast. Multiplanar CT image reconstructions of the cervical spine and maxillofacial structures were also generated. COMPARISON:  None. FINDINGS: CT HEAD FINDINGS Brain: Pneumocephalus in the skullbase. Acute subarachnoid hemorrhage filling suprasellar cisterns and anterior basal subarachnoid spaces. Can't exclude small intraparenchymal hematomas around the periphery. No mass effect or midline shift. Gray-white matter junctions are distinct. Basal cisterns are not effaced. Ventricles are not dilated. Vascular: No hyperdense vessel or unexpected calcification. Skull: Mildly depressed basal skull fractures involving the greater wing of the sphenoid bone extending to the anterior temporal bone at the temporomandibular joint. Fractures across the base of the clivus extending from right to left. Non depressed fractures of the left sphenoid bone involving the greater wing, extending to the sphenoid sinus  and to the temporomandibular joint. Calvarium appears intact. Sinuses/Orbits: See below. Mastoid air cells are not opacified. External and middle ear cavities are not opacified. Other: Examination is technically limited due to significant motion artifact throughout the examination. CT MAXILLOFACIAL FINDINGS Osseous: Depressed anterior nasal bone fractures. Fractures of the medial orbital walls bilaterally and of the left superior and inferior orbital walls. Fractures of the lateral, anterior, and inferior maxillary antral walls bilaterally. Fractures of the pterygoid plates on the right. Displaced fractures of the posterior nasal septum. Fracture of the right hard palate and maxilla. Mandibles and temporomandibular joints appear intact. Orbits: Globes and extraocular muscles appear intact and symmetrical. The no evidence of herniation or entrapment of the extraocular muscles. Bilateral periorbital hematomas and emphysematous changes. Bilateral extraconal retrobulbar emphysema, greater on the left. Sinuses: Opacification of the left frontal, bilateral ethmoid, bilateral sphenoid, and bilateral maxillary sinuses. Soft tissues: Prominent soft tissue swelling and subcutaneous emphysema in the facial regions inferior to the orbits and along the cheeks bilaterally. Soft tissue emphysema  demonstrated in the masticator muscle spaces and along the right mandible. Limited intracranial: See above. Other:  Examination is limited due to motion artifact. CT CERVICAL SPINE FINDINGS Alignment: Normal Skull base and vertebrae: Fractures of the skullbase as previously discussed. No vertebral compression deformities. Ununited ossicles are demonstrated at the anterior inferior endplate of C3 and at the anterior superior endplate of C6 which are not definitely corticated and could represent corner fractures. The posterior elements appear intact. C1-2 articulation appears intact. Soft tissues and spinal canal: Gas bubbles demonstrated  within the central canal at the level of C2 through C5. This likely represents gas within the CSF space, tracking down from the intracranial contents. No prevertebral soft tissue swelling. No central canal hematoma demonstrated. Disc levels: Mild degenerative changes in the cervical spine with mild endplate hypertrophic changes present. Upper chest: Emphysematous changes in the lung apices. Other: Examination is limited due to motion artifact. IMPRESSION: CT head: Acute intracranial subarachnoid and possibly intraparenchymal hemorrhages along the anterior frontal region. Acute hemorrhage in the suprasellar cisterns. Pneumocephalus. No mass-effect or midline shift. CT maxillary: Fractures as described throughout the skull base involving the sphenoid bones, clivus, and temporomandibular joints. Fractures of bilateral orbits and maxillary antral walls as described. Fractures also involve the pterygoid plates on the right. Associated soft tissue hematomas and soft tissue emphysema in the orbital and facial regions as described. CT cervical spine: Normal alignment. No acute displaced fractures, but cannot exclude anterior superior endplate corner fracture at C6 and anterior inferior endplate on a fracture at C3. Gas within the spinal canal, probably within the CSF space. These results were called by telephone at the time of interpretation on 01/05/2016 at 3:49 am to Dr. Quintella Reichert , who verbally acknowledged these results. Electronically Signed   By: Lucienne Capers M.D.   On: 01/05/2016 04:09   Dg Chest Port 1 View  Result Date: 01/05/2016 CLINICAL DATA:  Altered mental status after fall down stairs. Alcohol use. EXAM: PORTABLE CHEST 1 VIEW COMPARISON:  None. FINDINGS: Shallow inspiration. Borderline heart size with normal pulmonary vascularity, likely normal for technique. No blunting of costophrenic angles. No pneumothorax. No focal airspace disease or consolidation in the lungs. Calcified and tortuous aorta.  IMPRESSION: Shallow inspiration.  No evidence of active pulmonary disease. Electronically Signed   By: Lucienne Capers M.D.   On: 01/05/2016 03:10   Ct Maxillofacial Wo Cm  Result Date: 01/05/2016 CLINICAL DATA:  Trip and fall injury down steps. Injuries to the head. Increasingly lethargic since the fall. Alcohol use. EXAM: CT HEAD WITHOUT CONTRAST CT MAXILLOFACIAL WITHOUT CONTRAST CT CERVICAL SPINE WITHOUT CONTRAST TECHNIQUE: Multidetector CT imaging of the head, cervical spine, and maxillofacial structures were performed using the standard protocol without intravenous contrast. Multiplanar CT image reconstructions of the cervical spine and maxillofacial structures were also generated. COMPARISON:  None. FINDINGS: CT HEAD FINDINGS Brain: Pneumocephalus in the skullbase. Acute subarachnoid hemorrhage filling suprasellar cisterns and anterior basal subarachnoid spaces. Can't exclude small intraparenchymal hematomas around the periphery. No mass effect or midline shift. Gray-white matter junctions are distinct. Basal cisterns are not effaced. Ventricles are not dilated. Vascular: No hyperdense vessel or unexpected calcification. Skull: Mildly depressed basal skull fractures involving the greater wing of the sphenoid bone extending to the anterior temporal bone at the temporomandibular joint. Fractures across the base of the clivus extending from right to left. Non depressed fractures of the left sphenoid bone involving the greater wing, extending to the sphenoid sinus and to the temporomandibular joint.  Calvarium appears intact. Sinuses/Orbits: See below. Mastoid air cells are not opacified. External and middle ear cavities are not opacified. Other: Examination is technically limited due to significant motion artifact throughout the examination. CT MAXILLOFACIAL FINDINGS Osseous: Depressed anterior nasal bone fractures. Fractures of the medial orbital walls bilaterally and of the left superior and inferior  orbital walls. Fractures of the lateral, anterior, and inferior maxillary antral walls bilaterally. Fractures of the pterygoid plates on the right. Displaced fractures of the posterior nasal septum. Fracture of the right hard palate and maxilla. Mandibles and temporomandibular joints appear intact. Orbits: Globes and extraocular muscles appear intact and symmetrical. The no evidence of herniation or entrapment of the extraocular muscles. Bilateral periorbital hematomas and emphysematous changes. Bilateral extraconal retrobulbar emphysema, greater on the left. Sinuses: Opacification of the left frontal, bilateral ethmoid, bilateral sphenoid, and bilateral maxillary sinuses. Soft tissues: Prominent soft tissue swelling and subcutaneous emphysema in the facial regions inferior to the orbits and along the cheeks bilaterally. Soft tissue emphysema demonstrated in the masticator muscle spaces and along the right mandible. Limited intracranial: See above. Other:  Examination is limited due to motion artifact. CT CERVICAL SPINE FINDINGS Alignment: Normal Skull base and vertebrae: Fractures of the skullbase as previously discussed. No vertebral compression deformities. Ununited ossicles are demonstrated at the anterior inferior endplate of C3 and at the anterior superior endplate of C6 which are not definitely corticated and could represent corner fractures. The posterior elements appear intact. C1-2 articulation appears intact. Soft tissues and spinal canal: Gas bubbles demonstrated within the central canal at the level of C2 through C5. This likely represents gas within the CSF space, tracking down from the intracranial contents. No prevertebral soft tissue swelling. No central canal hematoma demonstrated. Disc levels: Mild degenerative changes in the cervical spine with mild endplate hypertrophic changes present. Upper chest: Emphysematous changes in the lung apices. Other: Examination is limited due to motion artifact.  IMPRESSION: CT head: Acute intracranial subarachnoid and possibly intraparenchymal hemorrhages along the anterior frontal region. Acute hemorrhage in the suprasellar cisterns. Pneumocephalus. No mass-effect or midline shift. CT maxillary: Fractures as described throughout the skull base involving the sphenoid bones, clivus, and temporomandibular joints. Fractures of bilateral orbits and maxillary antral walls as described. Fractures also involve the pterygoid plates on the right. Associated soft tissue hematomas and soft tissue emphysema in the orbital and facial regions as described. CT cervical spine: Normal alignment. No acute displaced fractures, but cannot exclude anterior superior endplate corner fracture at C6 and anterior inferior endplate on a fracture at C3. Gas within the spinal canal, probably within the CSF space. These results were called by telephone at the time of interpretation on 01/05/2016 at 3:49 am to Dr. Quintella Reichert , who verbally acknowledged these results. Electronically Signed   By: Lucienne Capers M.D.   On: 01/05/2016 04:09    Review of Systems  Constitutional: Negative.   HENT: Negative.   Eyes: Negative.   Respiratory: Negative.   Cardiovascular: Negative.   Gastrointestinal: Negative.   Genitourinary: Negative.   Musculoskeletal: Negative.   Skin: Negative.   Neurological: Negative.   Psychiatric/Behavioral: Negative.    Blood pressure (!) 151/89, pulse 64, temperature 98.1 F (36.7 C), temperature source Oral, resp. rate 15, height 6' (1.829 m), weight 82.4 kg (181 lb 10.5 oz), SpO2 96 %. Physical Exam  Constitutional: He is oriented to person, place, and time. He appears well-developed.  Eyes: EOM are normal. Pupils are equal, round, and reactive to light.  Cardiovascular:  Normal rate.   Respiratory: Effort normal.  GI: Soft. He exhibits no distension.  Neurological: He is alert and oriented to person, place, and time.  Skin: Skin is warm.  Psychiatric: He  has a normal mood and affect. His behavior is normal.    Assessment/Plan: When ready to eat, liquids only. Head of bed elevated as able.  No nose blowing.  Surgery may be needed will follow.  Wallace Going 01/05/2016, 11:23 AM

## 2016-01-05 NOTE — Progress Notes (Signed)
Patient ID: Patrick [name], male   DOB: September 10, 1955, 60 y.o.   MRN: [id number] F/U CT head and repeat CT c spine D/W radiology. Continue NPO. TBI team therapies in AM to include swallow eval. I also updated Dr. Arnoldo Morale. Georganna Skeans, MD, MPH, FACS Trauma: [phone] General Surgery: [phone]

## 2016-01-05 NOTE — ED Notes (Signed)
Updated pt. On plan of care 

## 2016-01-05 NOTE — Consult Note (Signed)
Reason for Consult: Traumatic subarachnoid hemorrhage, basilar skull fractures Referring Physician: Dr.Tsuei  Patrick [name] is an 60 y.o. male.  HPI: The patient is a 60 year old white male who was intoxicated last night with an alcohol level of 285. He fell down some steps landing on his face. He was brought to the ER. Workup included a head CT which demonstrated multiple facial and basilar skull fractures as well as a traumatic subarachnoid hemorrhage. The patient was admitted by the trauma service. A neurosurgical consultation has been requested.  Past Medical History:  Diagnosis Date  . Hyperlipidemia     History reviewed. No pertinent surgical history.  No family history on file.  Social History:  reports that he has never smoked. He has never used smokeless tobacco. He reports that he drinks alcohol. He reports that he does not use drugs.  Allergies: No Known Allergies  Medications:  I have reviewed the patient's current medications. Prior to Admission:  Prescriptions Prior to Admission  Medication Sig Dispense Refill Last Dose  . aspirin 325 MG tablet Take 325 mg by mouth daily.   01/04/2016 at Unknown time  . Coenzyme Q10 (EQL COQ10) 300 MG CAPS Take 1 capsule by mouth daily.   01/04/2016 at Unknown time  . rosuvastatin (CRESTOR) 20 MG tablet Take 20 mg by mouth daily.   01/04/2016 at Unknown time  . tamsulosin (FLOMAX) 0.4 MG CAPS capsule Take 0.4 mg by mouth.   01/04/2016 at Unknown time   Scheduled: . ondansetron (ZOFRAN) IV  4 mg Intravenous Once   Continuous: . 0.9 % NaCl with KCl 20 mEq / L     [id number] injection, ondansetron **OR** ondansetron (ZOFRAN) IV Anti-infectives    None       Results for orders placed or performed during the hospital encounter of 01/05/16 (from the past 48 hour(s))  Comprehensive metabolic panel     Status: Abnormal   Collection Time: 01/05/16  2:44 AM  Result Value Ref Range   Sodium 142 135 - 145 mmol/L   Potassium 3.7  3.5 - 5.1 mmol/L   Chloride 111 101 - 111 mmol/L   CO2 23 22 - 32 mmol/L   Glucose, Bld 162 (H) 65 - 99 mg/dL   BUN 13 6 - 20 mg/dL   Creatinine, Ser 0.99 0.61 - 1.24 mg/dL   Calcium 8.8 (L) 8.9 - 10.3 mg/dL   Total Protein 7.1 6.5 - 8.1 g/dL   Albumin 4.4 3.5 - 5.0 g/dL   AST 25 15 - 41 U/L   ALT 16 (L) 17 - 63 U/L   Alkaline Phosphatase 46 38 - 126 U/L   Total Bilirubin 0.7 0.3 - 1.2 mg/dL   GFR calc non Af Amer >60 >60 mL/min   GFR calc Af Amer >60 >60 mL/min    Comment: (NOTE) The eGFR has been calculated using the CKD EPI equation. This calculation has not been validated in all clinical situations. eGFR's persistently <60 mL/min signify possible Chronic Kidney Disease.    Anion gap 8 5 - 15  CBC with Differential     Status: None   Collection Time: 01/05/16  2:44 AM  Result Value Ref Range   WBC 6.9 4.0 - 10.5 K/uL   RBC 4.78 4.22 - 5.81 MIL/uL   Hemoglobin 14.3 13.0 - 17.0 g/dL   HCT 43.6 39.0 - 52.0 %   MCV 91.2 78.0 - 100.0 fL   MCH 29.9 26.0 - 34.0 pg   MCHC 32.8 30.0 -  36.0 g/dL   RDW 12.9 11.5 - 15.5 %   Platelets 178 150 - 400 K/uL   Neutrophils Relative % 55 %   Neutro Abs 3.8 1.7 - 7.7 K/uL   Lymphocytes Relative 39 %   Lymphs Abs 2.7 0.7 - 4.0 K/uL   Monocytes Relative 5 %   Monocytes Absolute 0.4 0.1 - 1.0 K/uL   Eosinophils Relative 1 %   Eosinophils Absolute 0.1 0.0 - 0.7 K/uL   Basophils Relative 0 %   Basophils Absolute 0.0 0.0 - 0.1 K/uL  Ethanol     Status: Abnormal   Collection Time: 01/05/16  2:44 AM  Result Value Ref Range   Alcohol, Ethyl (B) 285 (H) <5 mg/dL    Comment:        LOWEST DETECTABLE LIMIT FOR SERUM ALCOHOL IS 5 mg/dL FOR MEDICAL PURPOSES ONLY   Urinalysis, Routine w reflex microscopic     Status: None   Collection Time: 01/05/16  2:48 AM  Result Value Ref Range   Color, Urine YELLOW YELLOW   APPearance CLEAR CLEAR   Specific Gravity, Urine 1.011 1.005 - 1.030   pH 6.0 5.0 - 8.0   Glucose, UA NEGATIVE NEGATIVE mg/dL    Hgb urine dipstick NEGATIVE NEGATIVE   Bilirubin Urine NEGATIVE NEGATIVE   Ketones, ur NEGATIVE NEGATIVE mg/dL   Protein, ur NEGATIVE NEGATIVE mg/dL   Nitrite NEGATIVE NEGATIVE   Leukocytes, UA NEGATIVE NEGATIVE    Comment: MICROSCOPIC NOT DONE ON URINES WITH NEGATIVE PROTEIN, BLOOD, LEUKOCYTES, NITRITE, OR GLUCOSE <1000 mg/dL.    Ct Head Wo Contrast  Result Date: 01/05/2016 CLINICAL DATA:  Trip and fall injury down steps. Injuries to the head. Increasingly lethargic since the fall. Alcohol use. EXAM: CT HEAD WITHOUT CONTRAST CT MAXILLOFACIAL WITHOUT CONTRAST CT CERVICAL SPINE WITHOUT CONTRAST TECHNIQUE: Multidetector CT imaging of the head, cervical spine, and maxillofacial structures were performed using the standard protocol without intravenous contrast. Multiplanar CT image reconstructions of the cervical spine and maxillofacial structures were also generated. COMPARISON:  None. FINDINGS: CT HEAD FINDINGS Brain: Pneumocephalus in the skullbase. Acute subarachnoid hemorrhage filling suprasellar cisterns and anterior basal subarachnoid spaces. Can't exclude small intraparenchymal hematomas around the periphery. No mass effect or midline shift. Gray-white matter junctions are distinct. Basal cisterns are not effaced. Ventricles are not dilated. Vascular: No hyperdense vessel or unexpected calcification. Skull: Mildly depressed basal skull fractures involving the greater wing of the sphenoid bone extending to the anterior temporal bone at the temporomandibular joint. Fractures across the base of the clivus extending from right to left. Non depressed fractures of the left sphenoid bone involving the greater wing, extending to the sphenoid sinus and to the temporomandibular joint. Calvarium appears intact. Sinuses/Orbits: See below. Mastoid air cells are not opacified. External and middle ear cavities are not opacified. Other: Examination is technically limited due to significant motion artifact throughout  the examination. CT MAXILLOFACIAL FINDINGS Osseous: Depressed anterior nasal bone fractures. Fractures of the medial orbital walls bilaterally and of the left superior and inferior orbital walls. Fractures of the lateral, anterior, and inferior maxillary antral walls bilaterally. Fractures of the pterygoid plates on the right. Displaced fractures of the posterior nasal septum. Fracture of the right hard palate and maxilla. Mandibles and temporomandibular joints appear intact. Orbits: Globes and extraocular muscles appear intact and symmetrical. The no evidence of herniation or entrapment of the extraocular muscles. Bilateral periorbital hematomas and emphysematous changes. Bilateral extraconal retrobulbar emphysema, greater on the left. Sinuses: Opacification of the left  frontal, bilateral ethmoid, bilateral sphenoid, and bilateral maxillary sinuses. Soft tissues: Prominent soft tissue swelling and subcutaneous emphysema in the facial regions inferior to the orbits and along the cheeks bilaterally. Soft tissue emphysema demonstrated in the masticator muscle spaces and along the right mandible. Limited intracranial: See above. Other:  Examination is limited due to motion artifact. CT CERVICAL SPINE FINDINGS Alignment: Normal Skull base and vertebrae: Fractures of the skullbase as previously discussed. No vertebral compression deformities. Ununited ossicles are demonstrated at the anterior inferior endplate of C3 and at the anterior superior endplate of C6 which are not definitely corticated and could represent corner fractures. The posterior elements appear intact. C1-2 articulation appears intact. Soft tissues and spinal canal: Gas bubbles demonstrated within the central canal at the level of C2 through C5. This likely represents gas within the CSF space, tracking down from the intracranial contents. No prevertebral soft tissue swelling. No central canal hematoma demonstrated. Disc levels: Mild degenerative changes  in the cervical spine with mild endplate hypertrophic changes present. Upper chest: Emphysematous changes in the lung apices. Other: Examination is limited due to motion artifact. IMPRESSION: CT head: Acute intracranial subarachnoid and possibly intraparenchymal hemorrhages along the anterior frontal region. Acute hemorrhage in the suprasellar cisterns. Pneumocephalus. No mass-effect or midline shift. CT maxillary: Fractures as described throughout the skull base involving the sphenoid bones, clivus, and temporomandibular joints. Fractures of bilateral orbits and maxillary antral walls as described. Fractures also involve the pterygoid plates on the right. Associated soft tissue hematomas and soft tissue emphysema in the orbital and facial regions as described. CT cervical spine: Normal alignment. No acute displaced fractures, but cannot exclude anterior superior endplate corner fracture at C6 and anterior inferior endplate on a fracture at C3. Gas within the spinal canal, probably within the CSF space. These results were called by telephone at the time of interpretation on 01/05/2016 at 3:49 am to Dr. Quintella Reichert , who verbally acknowledged these results. Electronically Signed   By: Lucienne Capers M.D.   On: 01/05/2016 04:09   Ct Cervical Spine Wo Contrast  Result Date: 01/05/2016 CLINICAL DATA:  Trip and fall injury down steps. Injuries to the head. Increasingly lethargic since the fall. Alcohol use. EXAM: CT HEAD WITHOUT CONTRAST CT MAXILLOFACIAL WITHOUT CONTRAST CT CERVICAL SPINE WITHOUT CONTRAST TECHNIQUE: Multidetector CT imaging of the head, cervical spine, and maxillofacial structures were performed using the standard protocol without intravenous contrast. Multiplanar CT image reconstructions of the cervical spine and maxillofacial structures were also generated. COMPARISON:  None. FINDINGS: CT HEAD FINDINGS Brain: Pneumocephalus in the skullbase. Acute subarachnoid hemorrhage filling suprasellar  cisterns and anterior basal subarachnoid spaces. Can't exclude small intraparenchymal hematomas around the periphery. No mass effect or midline shift. Gray-white matter junctions are distinct. Basal cisterns are not effaced. Ventricles are not dilated. Vascular: No hyperdense vessel or unexpected calcification. Skull: Mildly depressed basal skull fractures involving the greater wing of the sphenoid bone extending to the anterior temporal bone at the temporomandibular joint. Fractures across the base of the clivus extending from right to left. Non depressed fractures of the left sphenoid bone involving the greater wing, extending to the sphenoid sinus and to the temporomandibular joint. Calvarium appears intact. Sinuses/Orbits: See below. Mastoid air cells are not opacified. External and middle ear cavities are not opacified. Other: Examination is technically limited due to significant motion artifact throughout the examination. CT MAXILLOFACIAL FINDINGS Osseous: Depressed anterior nasal bone fractures. Fractures of the medial orbital walls bilaterally and of the left superior  and inferior orbital walls. Fractures of the lateral, anterior, and inferior maxillary antral walls bilaterally. Fractures of the pterygoid plates on the right. Displaced fractures of the posterior nasal septum. Fracture of the right hard palate and maxilla. Mandibles and temporomandibular joints appear intact. Orbits: Globes and extraocular muscles appear intact and symmetrical. The no evidence of herniation or entrapment of the extraocular muscles. Bilateral periorbital hematomas and emphysematous changes. Bilateral extraconal retrobulbar emphysema, greater on the left. Sinuses: Opacification of the left frontal, bilateral ethmoid, bilateral sphenoid, and bilateral maxillary sinuses. Soft tissues: Prominent soft tissue swelling and subcutaneous emphysema in the facial regions inferior to the orbits and along the cheeks bilaterally. Soft  tissue emphysema demonstrated in the masticator muscle spaces and along the right mandible. Limited intracranial: See above. Other:  Examination is limited due to motion artifact. CT CERVICAL SPINE FINDINGS Alignment: Normal Skull base and vertebrae: Fractures of the skullbase as previously discussed. No vertebral compression deformities. Ununited ossicles are demonstrated at the anterior inferior endplate of C3 and at the anterior superior endplate of C6 which are not definitely corticated and could represent corner fractures. The posterior elements appear intact. C1-2 articulation appears intact. Soft tissues and spinal canal: Gas bubbles demonstrated within the central canal at the level of C2 through C5. This likely represents gas within the CSF space, tracking down from the intracranial contents. No prevertebral soft tissue swelling. No central canal hematoma demonstrated. Disc levels: Mild degenerative changes in the cervical spine with mild endplate hypertrophic changes present. Upper chest: Emphysematous changes in the lung apices. Other: Examination is limited due to motion artifact. IMPRESSION: CT head: Acute intracranial subarachnoid and possibly intraparenchymal hemorrhages along the anterior frontal region. Acute hemorrhage in the suprasellar cisterns. Pneumocephalus. No mass-effect or midline shift. CT maxillary: Fractures as described throughout the skull base involving the sphenoid bones, clivus, and temporomandibular joints. Fractures of bilateral orbits and maxillary antral walls as described. Fractures also involve the pterygoid plates on the right. Associated soft tissue hematomas and soft tissue emphysema in the orbital and facial regions as described. CT cervical spine: Normal alignment. No acute displaced fractures, but cannot exclude anterior superior endplate corner fracture at C6 and anterior inferior endplate on a fracture at C3. Gas within the spinal canal, probably within the CSF space.  These results were called by telephone at the time of interpretation on 01/05/2016 at 3:49 am to Dr. Quintella Reichert , who verbally acknowledged these results. Electronically Signed   By: Lucienne Capers M.D.   On: 01/05/2016 04:09   Dg Chest Port 1 View  Result Date: 01/05/2016 CLINICAL DATA:  Altered mental status after fall down stairs. Alcohol use. EXAM: PORTABLE CHEST 1 VIEW COMPARISON:  None. FINDINGS: Shallow inspiration. Borderline heart size with normal pulmonary vascularity, likely normal for technique. No blunting of costophrenic angles. No pneumothorax. No focal airspace disease or consolidation in the lungs. Calcified and tortuous aorta. IMPRESSION: Shallow inspiration.  No evidence of active pulmonary disease. Electronically Signed   By: Lucienne Capers M.D.   On: 01/05/2016 03:10   Ct Maxillofacial Wo Cm  Result Date: 01/05/2016 CLINICAL DATA:  Trip and fall injury down steps. Injuries to the head. Increasingly lethargic since the fall. Alcohol use. EXAM: CT HEAD WITHOUT CONTRAST CT MAXILLOFACIAL WITHOUT CONTRAST CT CERVICAL SPINE WITHOUT CONTRAST TECHNIQUE: Multidetector CT imaging of the head, cervical spine, and maxillofacial structures were performed using the standard protocol without intravenous contrast. Multiplanar CT image reconstructions of the cervical spine and maxillofacial structures were also  generated. COMPARISON:  None. FINDINGS: CT HEAD FINDINGS Brain: Pneumocephalus in the skullbase. Acute subarachnoid hemorrhage filling suprasellar cisterns and anterior basal subarachnoid spaces. Can't exclude small intraparenchymal hematomas around the periphery. No mass effect or midline shift. Gray-white matter junctions are distinct. Basal cisterns are not effaced. Ventricles are not dilated. Vascular: No hyperdense vessel or unexpected calcification. Skull: Mildly depressed basal skull fractures involving the greater wing of the sphenoid bone extending to the anterior temporal bone at  the temporomandibular joint. Fractures across the base of the clivus extending from right to left. Non depressed fractures of the left sphenoid bone involving the greater wing, extending to the sphenoid sinus and to the temporomandibular joint. Calvarium appears intact. Sinuses/Orbits: See below. Mastoid air cells are not opacified. External and middle ear cavities are not opacified. Other: Examination is technically limited due to significant motion artifact throughout the examination. CT MAXILLOFACIAL FINDINGS Osseous: Depressed anterior nasal bone fractures. Fractures of the medial orbital walls bilaterally and of the left superior and inferior orbital walls. Fractures of the lateral, anterior, and inferior maxillary antral walls bilaterally. Fractures of the pterygoid plates on the right. Displaced fractures of the posterior nasal septum. Fracture of the right hard palate and maxilla. Mandibles and temporomandibular joints appear intact. Orbits: Globes and extraocular muscles appear intact and symmetrical. The no evidence of herniation or entrapment of the extraocular muscles. Bilateral periorbital hematomas and emphysematous changes. Bilateral extraconal retrobulbar emphysema, greater on the left. Sinuses: Opacification of the left frontal, bilateral ethmoid, bilateral sphenoid, and bilateral maxillary sinuses. Soft tissues: Prominent soft tissue swelling and subcutaneous emphysema in the facial regions inferior to the orbits and along the cheeks bilaterally. Soft tissue emphysema demonstrated in the masticator muscle spaces and along the right mandible. Limited intracranial: See above. Other:  Examination is limited due to motion artifact. CT CERVICAL SPINE FINDINGS Alignment: Normal Skull base and vertebrae: Fractures of the skullbase as previously discussed. No vertebral compression deformities. Ununited ossicles are demonstrated at the anterior inferior endplate of C3 and at the anterior superior endplate  of C6 which are not definitely corticated and could represent corner fractures. The posterior elements appear intact. C1-2 articulation appears intact. Soft tissues and spinal canal: Gas bubbles demonstrated within the central canal at the level of C2 through C5. This likely represents gas within the CSF space, tracking down from the intracranial contents. No prevertebral soft tissue swelling. No central canal hematoma demonstrated. Disc levels: Mild degenerative changes in the cervical spine with mild endplate hypertrophic changes present. Upper chest: Emphysematous changes in the lung apices. Other: Examination is limited due to motion artifact. IMPRESSION: CT head: Acute intracranial subarachnoid and possibly intraparenchymal hemorrhages along the anterior frontal region. Acute hemorrhage in the suprasellar cisterns. Pneumocephalus. No mass-effect or midline shift. CT maxillary: Fractures as described throughout the skull base involving the sphenoid bones, clivus, and temporomandibular joints. Fractures of bilateral orbits and maxillary antral walls as described. Fractures also involve the pterygoid plates on the right. Associated soft tissue hematomas and soft tissue emphysema in the orbital and facial regions as described. CT cervical spine: Normal alignment. No acute displaced fractures, but cannot exclude anterior superior endplate corner fracture at C6 and anterior inferior endplate on a fracture at C3. Gas within the spinal canal, probably within the CSF space. These results were called by telephone at the time of interpretation on 01/05/2016 at 3:49 am to Dr. Quintella Reichert , who verbally acknowledged these results. Electronically Signed   By: Oren Beckmann.D.  On: 01/05/2016 04:09    ROS: As above. The patient denies neck pain, back pain, numbness, tingling, weakness. He complains only of facial pain. Blood pressure 129/80, pulse 71, temperature 98 F (36.7 C), resp. rate 22, height 6' (1.829  m), weight 82.4 kg (181 lb 10.5 oz), SpO2 91 %. Physical Exam  General: An alert and pleasant 60 year old white male wearing a cervical collar in no apparent distress. He is wearing a cervical collar.  HEENT: The patient has bilateral periorbital ecchymosis, some facial swelling, blood in his right external auditory canal. I don't see any active CSF otorrhea or rhinorrhea. His pupils are equal. Extraocular muscles are intact.  Neck: Supple without masses or deformities. He is wearing a cervical collar.  Thorax: Symmetric  Abdomen: Soft  Back exam: unremarkable  Extremities: Unremarkable  Neurologic exam: The patient is alert and oriented 3. Glasgow Coma Scale 15. Cranial nerves II through XII were examined bilaterally and grossly normal. Vision and hearing are grossly normal bilaterally. Motor strength is 5 over 5 bilateral biceps, triceps, handgrip, gastrocnemius, dorsiflexors. Cerebellar function is intact to rapid alternating movements of the upper extremities bilaterally. Sensory function is intact to light touch sensation all tested dermatomes bilaterally.  Imaging studies:  I have reviewed the patient's head CT performed today at [facility]. He has a traumatic subarachnoid hemorrhage. There is pneumocephaly. He has multiple basilar skull fractures with fluid in the ethmoid and sphenoid sinuses.  I have also reviewed the patient's cervical CT performed [facility] today. It demonstrates some mild degenerative changes I don't see any fractures or abnormal soft tissue swelling. I think what the radiologist notes are small osteophytes.  Assessment/Plan: Traumatic brain injury, traumatic subarachnoid hemorrhage, basilar skull fractures, pneumocephaly, etc: The patient is doing well clinically. We will continue to observe him and plan to repeat his CAT scan tomorrow. He is at risk for CSF leak although he doesn't seem to have one presently. We will keep his head of bed  elevated.  Abnormal cervical CT: These appear to be degenerative changes. He is clinically symptomatic. We will plan to clear him with flexion-extension x-rays once he sobers up.    Zoriyah Scheidegger D 01/05/2016, 7:54 AM

## 2016-01-05 NOTE — H&P (Signed)
History   Patrick [name] is an 60 y.o. male.   Chief Complaint:  Chief Complaint  Patient presents with  . Fall    HPI This is a 60 yo male - intoxicated, fell down several stairs, landing on his face.  Transported by EMS with full immobilization - level 2 trauma code.  Probable LOC.  Patient is amnestic for event.  He is able to answer questions and denies any other pain besides his face and head.  He is moving all fours spontaneously.  He will follow some commands but is quite lethargic.  Past Medical History:  Diagnosis Date  . Hyperlipidemia     History reviewed. No pertinent surgical history.  No family history on file. Social History:  reports that he has never smoked. He has never used smokeless tobacco. He reports that he drinks alcohol. He reports that he does not use drugs.  Allergies  No Known Allergies  Home Medications   Prior to Admission medications   Medication Sig Start Date End Date Taking? Authorizing Provider  aspirin 325 MG tablet Take 325 mg by mouth daily.   Yes Historical Provider, MD  Coenzyme Q10 (EQL COQ10) 300 MG CAPS Take 1 capsule by mouth daily.   Yes Historical Provider, MD  rosuvastatin (CRESTOR) 20 MG tablet Take 20 mg by mouth daily.   Yes Historical Provider, MD  tamsulosin (FLOMAX) 0.4 MG CAPS capsule Take 0.4 mg by mouth.   Yes Historical Provider, MD    Trauma Course   Results for orders placed or performed during the hospital encounter of 01/05/16 (from the past 48 hour(s))  Comprehensive metabolic panel     Status: Abnormal   Collection Time: 01/05/16  2:44 AM  Result Value Ref Range   Sodium 142 135 - 145 mmol/L   Potassium 3.7 3.5 - 5.1 mmol/L   Chloride 111 101 - 111 mmol/L   CO2 23 22 - 32 mmol/L   Glucose, Bld 162 (H) 65 - 99 mg/dL   BUN 13 6 - 20 mg/dL   Creatinine, Ser 0.99 0.61 - 1.24 mg/dL   Calcium 8.8 (L) 8.9 - 10.3 mg/dL   Total Protein 7.1 6.5 - 8.1 g/dL   Albumin 4.4 3.5 - 5.0 g/dL   AST 25 15 - 41 U/L    ALT 16 (L) 17 - 63 U/L   Alkaline Phosphatase 46 38 - 126 U/L   Total Bilirubin 0.7 0.3 - 1.2 mg/dL   GFR calc non Af Amer >60 >60 mL/min   GFR calc Af Amer >60 >60 mL/min    Comment: (NOTE) The eGFR has been calculated using the CKD EPI equation. This calculation has not been validated in all clinical situations. eGFR's persistently <60 mL/min signify possible Chronic Kidney Disease.    Anion gap 8 5 - 15  CBC with Differential     Status: None   Collection Time: 01/05/16  2:44 AM  Result Value Ref Range   WBC 6.9 4.0 - 10.5 K/uL   RBC 4.78 4.22 - 5.81 MIL/uL   Hemoglobin 14.3 13.0 - 17.0 g/dL   HCT 43.6 39.0 - 52.0 %   MCV 91.2 78.0 - 100.0 fL   MCH 29.9 26.0 - 34.0 pg   MCHC 32.8 30.0 - 36.0 g/dL   RDW 12.9 11.5 - 15.5 %   Platelets 178 150 - 400 K/uL   Neutrophils Relative % 55 %   Neutro Abs 3.8 1.7 - 7.7 K/uL   Lymphocytes Relative 39 %  Lymphs Abs 2.7 0.7 - 4.0 K/uL   Monocytes Relative 5 %   Monocytes Absolute 0.4 0.1 - 1.0 K/uL   Eosinophils Relative 1 %   Eosinophils Absolute 0.1 0.0 - 0.7 K/uL   Basophils Relative 0 %   Basophils Absolute 0.0 0.0 - 0.1 K/uL  Ethanol     Status: Abnormal   Collection Time: 01/05/16  2:44 AM  Result Value Ref Range   Alcohol, Ethyl (B) 285 (H) <5 mg/dL    Comment:        LOWEST DETECTABLE LIMIT FOR SERUM ALCOHOL IS 5 mg/dL FOR MEDICAL PURPOSES ONLY   Urinalysis, Routine w reflex microscopic     Status: None   Collection Time: 01/05/16  2:48 AM  Result Value Ref Range   Color, Urine YELLOW YELLOW   APPearance CLEAR CLEAR   Specific Gravity, Urine 1.011 1.005 - 1.030   pH 6.0 5.0 - 8.0   Glucose, UA NEGATIVE NEGATIVE mg/dL   Hgb urine dipstick NEGATIVE NEGATIVE   Bilirubin Urine NEGATIVE NEGATIVE   Ketones, ur NEGATIVE NEGATIVE mg/dL   Protein, ur NEGATIVE NEGATIVE mg/dL   Nitrite NEGATIVE NEGATIVE   Leukocytes, UA NEGATIVE NEGATIVE    Comment: MICROSCOPIC NOT DONE ON URINES WITH NEGATIVE PROTEIN, BLOOD, LEUKOCYTES,  NITRITE, OR GLUCOSE <1000 mg/dL.   Ct Head Wo Contrast  Result Date: 01/05/2016 CLINICAL DATA:  Trip and fall injury down steps. Injuries to the head. Increasingly lethargic since the fall. Alcohol use. EXAM: CT HEAD WITHOUT CONTRAST CT MAXILLOFACIAL WITHOUT CONTRAST CT CERVICAL SPINE WITHOUT CONTRAST TECHNIQUE: Multidetector CT imaging of the head, cervical spine, and maxillofacial structures were performed using the standard protocol without intravenous contrast. Multiplanar CT image reconstructions of the cervical spine and maxillofacial structures were also generated. COMPARISON:  None. FINDINGS: CT HEAD FINDINGS Brain: Pneumocephalus in the skullbase. Acute subarachnoid hemorrhage filling suprasellar cisterns and anterior basal subarachnoid spaces. Can't exclude small intraparenchymal hematomas around the periphery. No mass effect or midline shift. Gray-white matter junctions are distinct. Basal cisterns are not effaced. Ventricles are not dilated. Vascular: No hyperdense vessel or unexpected calcification. Skull: Mildly depressed basal skull fractures involving the greater wing of the sphenoid bone extending to the anterior temporal bone at the temporomandibular joint. Fractures across the base of the clivus extending from right to left. Non depressed fractures of the left sphenoid bone involving the greater wing, extending to the sphenoid sinus and to the temporomandibular joint. Calvarium appears intact. Sinuses/Orbits: See below. Mastoid air cells are not opacified. External and middle ear cavities are not opacified. Other: Examination is technically limited due to significant motion artifact throughout the examination. CT MAXILLOFACIAL FINDINGS Osseous: Depressed anterior nasal bone fractures. Fractures of the medial orbital walls bilaterally and of the left superior and inferior orbital walls. Fractures of the lateral, anterior, and inferior maxillary antral walls bilaterally. Fractures of the  pterygoid plates on the right. Displaced fractures of the posterior nasal septum. Fracture of the right hard palate and maxilla. Mandibles and temporomandibular joints appear intact. Orbits: Globes and extraocular muscles appear intact and symmetrical. The no evidence of herniation or entrapment of the extraocular muscles. Bilateral periorbital hematomas and emphysematous changes. Bilateral extraconal retrobulbar emphysema, greater on the left. Sinuses: Opacification of the left frontal, bilateral ethmoid, bilateral sphenoid, and bilateral maxillary sinuses. Soft tissues: Prominent soft tissue swelling and subcutaneous emphysema in the facial regions inferior to the orbits and along the cheeks bilaterally. Soft tissue emphysema demonstrated in the masticator muscle spaces and along the  right mandible. Limited intracranial: See above. Other:  Examination is limited due to motion artifact. CT CERVICAL SPINE FINDINGS Alignment: Normal Skull base and vertebrae: Fractures of the skullbase as previously discussed. No vertebral compression deformities. Ununited ossicles are demonstrated at the anterior inferior endplate of C3 and at the anterior superior endplate of C6 which are not definitely corticated and could represent corner fractures. The posterior elements appear intact. C1-2 articulation appears intact. Soft tissues and spinal canal: Gas bubbles demonstrated within the central canal at the level of C2 through C5. This likely represents gas within the CSF space, tracking down from the intracranial contents. No prevertebral soft tissue swelling. No central canal hematoma demonstrated. Disc levels: Mild degenerative changes in the cervical spine with mild endplate hypertrophic changes present. Upper chest: Emphysematous changes in the lung apices. Other: Examination is limited due to motion artifact. IMPRESSION: CT head: Acute intracranial subarachnoid and possibly intraparenchymal hemorrhages along the anterior  frontal region. Acute hemorrhage in the suprasellar cisterns. Pneumocephalus. No mass-effect or midline shift. CT maxillary: Fractures as described throughout the skull base involving the sphenoid bones, clivus, and temporomandibular joints. Fractures of bilateral orbits and maxillary antral walls as described. Fractures also involve the pterygoid plates on the right. Associated soft tissue hematomas and soft tissue emphysema in the orbital and facial regions as described. CT cervical spine: Normal alignment. No acute displaced fractures, but cannot exclude anterior superior endplate corner fracture at C6 and anterior inferior endplate on a fracture at C3. Gas within the spinal canal, probably within the CSF space. These results were called by telephone at the time of interpretation on 01/05/2016 at 3:49 am to Dr. Quintella Reichert , who verbally acknowledged these results. Electronically Signed   By: Lucienne Capers M.D.   On: 01/05/2016 04:09   Ct Cervical Spine Wo Contrast  Result Date: 01/05/2016 CLINICAL DATA:  Trip and fall injury down steps. Injuries to the head. Increasingly lethargic since the fall. Alcohol use. EXAM: CT HEAD WITHOUT CONTRAST CT MAXILLOFACIAL WITHOUT CONTRAST CT CERVICAL SPINE WITHOUT CONTRAST TECHNIQUE: Multidetector CT imaging of the head, cervical spine, and maxillofacial structures were performed using the standard protocol without intravenous contrast. Multiplanar CT image reconstructions of the cervical spine and maxillofacial structures were also generated. COMPARISON:  None. FINDINGS: CT HEAD FINDINGS Brain: Pneumocephalus in the skullbase. Acute subarachnoid hemorrhage filling suprasellar cisterns and anterior basal subarachnoid spaces. Can't exclude small intraparenchymal hematomas around the periphery. No mass effect or midline shift. Gray-white matter junctions are distinct. Basal cisterns are not effaced. Ventricles are not dilated. Vascular: No hyperdense vessel or unexpected  calcification. Skull: Mildly depressed basal skull fractures involving the greater wing of the sphenoid bone extending to the anterior temporal bone at the temporomandibular joint. Fractures across the base of the clivus extending from right to left. Non depressed fractures of the left sphenoid bone involving the greater wing, extending to the sphenoid sinus and to the temporomandibular joint. Calvarium appears intact. Sinuses/Orbits: See below. Mastoid air cells are not opacified. External and middle ear cavities are not opacified. Other: Examination is technically limited due to significant motion artifact throughout the examination. CT MAXILLOFACIAL FINDINGS Osseous: Depressed anterior nasal bone fractures. Fractures of the medial orbital walls bilaterally and of the left superior and inferior orbital walls. Fractures of the lateral, anterior, and inferior maxillary antral walls bilaterally. Fractures of the pterygoid plates on the right. Displaced fractures of the posterior nasal septum. Fracture of the right hard palate and maxilla. Mandibles and temporomandibular joints appear  intact. Orbits: Globes and extraocular muscles appear intact and symmetrical. The no evidence of herniation or entrapment of the extraocular muscles. Bilateral periorbital hematomas and emphysematous changes. Bilateral extraconal retrobulbar emphysema, greater on the left. Sinuses: Opacification of the left frontal, bilateral ethmoid, bilateral sphenoid, and bilateral maxillary sinuses. Soft tissues: Prominent soft tissue swelling and subcutaneous emphysema in the facial regions inferior to the orbits and along the cheeks bilaterally. Soft tissue emphysema demonstrated in the masticator muscle spaces and along the right mandible. Limited intracranial: See above. Other:  Examination is limited due to motion artifact. CT CERVICAL SPINE FINDINGS Alignment: Normal Skull base and vertebrae: Fractures of the skullbase as previously discussed.  No vertebral compression deformities. Ununited ossicles are demonstrated at the anterior inferior endplate of C3 and at the anterior superior endplate of C6 which are not definitely corticated and could represent corner fractures. The posterior elements appear intact. C1-2 articulation appears intact. Soft tissues and spinal canal: Gas bubbles demonstrated within the central canal at the level of C2 through C5. This likely represents gas within the CSF space, tracking down from the intracranial contents. No prevertebral soft tissue swelling. No central canal hematoma demonstrated. Disc levels: Mild degenerative changes in the cervical spine with mild endplate hypertrophic changes present. Upper chest: Emphysematous changes in the lung apices. Other: Examination is limited due to motion artifact. IMPRESSION: CT head: Acute intracranial subarachnoid and possibly intraparenchymal hemorrhages along the anterior frontal region. Acute hemorrhage in the suprasellar cisterns. Pneumocephalus. No mass-effect or midline shift. CT maxillary: Fractures as described throughout the skull base involving the sphenoid bones, clivus, and temporomandibular joints. Fractures of bilateral orbits and maxillary antral walls as described. Fractures also involve the pterygoid plates on the right. Associated soft tissue hematomas and soft tissue emphysema in the orbital and facial regions as described. CT cervical spine: Normal alignment. No acute displaced fractures, but cannot exclude anterior superior endplate corner fracture at C6 and anterior inferior endplate on a fracture at C3. Gas within the spinal canal, probably within the CSF space. These results were called by telephone at the time of interpretation on 01/05/2016 at 3:49 am to Dr. Quintella Reichert , who verbally acknowledged these results. Electronically Signed   By: Lucienne Capers M.D.   On: 01/05/2016 04:09   Dg Chest Port 1 View  Result Date: 01/05/2016 CLINICAL DATA:   Altered mental status after fall down stairs. Alcohol use. EXAM: PORTABLE CHEST 1 VIEW COMPARISON:  None. FINDINGS: Shallow inspiration. Borderline heart size with normal pulmonary vascularity, likely normal for technique. No blunting of costophrenic angles. No pneumothorax. No focal airspace disease or consolidation in the lungs. Calcified and tortuous aorta. IMPRESSION: Shallow inspiration.  No evidence of active pulmonary disease. Electronically Signed   By: Lucienne Capers M.D.   On: 01/05/2016 03:10   Ct Maxillofacial Wo Cm  Result Date: 01/05/2016 CLINICAL DATA:  Trip and fall injury down steps. Injuries to the head. Increasingly lethargic since the fall. Alcohol use. EXAM: CT HEAD WITHOUT CONTRAST CT MAXILLOFACIAL WITHOUT CONTRAST CT CERVICAL SPINE WITHOUT CONTRAST TECHNIQUE: Multidetector CT imaging of the head, cervical spine, and maxillofacial structures were performed using the standard protocol without intravenous contrast. Multiplanar CT image reconstructions of the cervical spine and maxillofacial structures were also generated. COMPARISON:  None. FINDINGS: CT HEAD FINDINGS Brain: Pneumocephalus in the skullbase. Acute subarachnoid hemorrhage filling suprasellar cisterns and anterior basal subarachnoid spaces. Can't exclude small intraparenchymal hematomas around the periphery. No mass effect or midline shift. Gray-white matter junctions are distinct.  Basal cisterns are not effaced. Ventricles are not dilated. Vascular: No hyperdense vessel or unexpected calcification. Skull: Mildly depressed basal skull fractures involving the greater wing of the sphenoid bone extending to the anterior temporal bone at the temporomandibular joint. Fractures across the base of the clivus extending from right to left. Non depressed fractures of the left sphenoid bone involving the greater wing, extending to the sphenoid sinus and to the temporomandibular joint. Calvarium appears intact. Sinuses/Orbits: See below.  Mastoid air cells are not opacified. External and middle ear cavities are not opacified. Other: Examination is technically limited due to significant motion artifact throughout the examination. CT MAXILLOFACIAL FINDINGS Osseous: Depressed anterior nasal bone fractures. Fractures of the medial orbital walls bilaterally and of the left superior and inferior orbital walls. Fractures of the lateral, anterior, and inferior maxillary antral walls bilaterally. Fractures of the pterygoid plates on the right. Displaced fractures of the posterior nasal septum. Fracture of the right hard palate and maxilla. Mandibles and temporomandibular joints appear intact. Orbits: Globes and extraocular muscles appear intact and symmetrical. The no evidence of herniation or entrapment of the extraocular muscles. Bilateral periorbital hematomas and emphysematous changes. Bilateral extraconal retrobulbar emphysema, greater on the left. Sinuses: Opacification of the left frontal, bilateral ethmoid, bilateral sphenoid, and bilateral maxillary sinuses. Soft tissues: Prominent soft tissue swelling and subcutaneous emphysema in the facial regions inferior to the orbits and along the cheeks bilaterally. Soft tissue emphysema demonstrated in the masticator muscle spaces and along the right mandible. Limited intracranial: See above. Other:  Examination is limited due to motion artifact. CT CERVICAL SPINE FINDINGS Alignment: Normal Skull base and vertebrae: Fractures of the skullbase as previously discussed. No vertebral compression deformities. Ununited ossicles are demonstrated at the anterior inferior endplate of C3 and at the anterior superior endplate of C6 which are not definitely corticated and could represent corner fractures. The posterior elements appear intact. C1-2 articulation appears intact. Soft tissues and spinal canal: Gas bubbles demonstrated within the central canal at the level of C2 through C5. This likely represents gas within  the CSF space, tracking down from the intracranial contents. No prevertebral soft tissue swelling. No central canal hematoma demonstrated. Disc levels: Mild degenerative changes in the cervical spine with mild endplate hypertrophic changes present. Upper chest: Emphysematous changes in the lung apices. Other: Examination is limited due to motion artifact. IMPRESSION: CT head: Acute intracranial subarachnoid and possibly intraparenchymal hemorrhages along the anterior frontal region. Acute hemorrhage in the suprasellar cisterns. Pneumocephalus. No mass-effect or midline shift. CT maxillary: Fractures as described throughout the skull base involving the sphenoid bones, clivus, and temporomandibular joints. Fractures of bilateral orbits and maxillary antral walls as described. Fractures also involve the pterygoid plates on the right. Associated soft tissue hematomas and soft tissue emphysema in the orbital and facial regions as described. CT cervical spine: Normal alignment. No acute displaced fractures, but cannot exclude anterior superior endplate corner fracture at C6 and anterior inferior endplate on a fracture at C3. Gas within the spinal canal, probably within the CSF space. These results were called by telephone at the time of interpretation on 01/05/2016 at 3:49 am to Dr. Quintella Reichert , who verbally acknowledged these results. Electronically Signed   By: Lucienne Capers M.D.   On: 01/05/2016 04:09    Review of Systems  Constitutional: Negative for weight loss.  HENT: Negative for ear discharge, ear pain, hearing loss and tinnitus.   Eyes: Positive for pain. Negative for blurred vision, double vision and photophobia.  Respiratory: Negative for cough, sputum production and shortness of breath.   Cardiovascular: Negative for chest pain.  Gastrointestinal: Negative for abdominal pain, nausea and vomiting.  Genitourinary: Negative for dysuria, flank pain, frequency and urgency.  Musculoskeletal:  Positive for falls and neck pain. Negative for back pain, joint pain and myalgias.  Neurological: Positive for loss of consciousness and headaches. Negative for dizziness, tingling, sensory change and focal weakness.  Endo/Heme/Allergies: Does not bruise/bleed easily.  Psychiatric/Behavioral: Positive for substance abuse. Negative for depression and memory loss. The patient is not nervous/anxious.     Blood pressure 143/85, pulse 67, temperature 98 F (36.7 C), resp. rate 20, SpO2 100 %. Physical Exam  Vitals reviewed. Constitutional: He appears well-developed and well-nourished.  HENT:  Head: Normocephalic.  Right Ear: External ear normal.  Left Ear: External ear normal.  Mouth/Throat: Oropharynx is clear and moist.  Tender over nose; minimal swelling; some clear drainage from left nares  Eyes:  Significant left periorbital ecchymosis/ edema; unable to clearly examine left eye, but EOMI seem to be intact. Able to see from right eye - minimal ecchymosis; pupil reactive  Neck: No tracheal deviation present. No thyromegaly present.  Tender posteriorly  Cardiovascular: Normal rate.   No murmur heard. Respiratory: Effort normal and breath sounds normal. No respiratory distress. He exhibits no tenderness.  GI: Soft. Bowel sounds are normal. There is no tenderness.  No hepatosplenomegaly  Musculoskeletal: Normal range of motion. He exhibits no deformity.  Neurological:  Lethargic, but arousable Able to follow commands and verbally answer questions Falls right back to sleep [id number]  Skin: Skin is warm and dry.     Assessment/Plan Fall down stairs - intoxicated 1.  Acute subarachnoid hemorrhage/ intraparenchymal hemorrhages 2.  Pneumocephalus 3.  Basilar skull fractures involving sphenoid, clivus, and TMJ - associated CSF rhinorrhea 4.  Bilateral orbital fractures 5.  Cervical spine tenderness - less than optimal CT c-spine due to motion   Admit to ICU Neurosurgery - [place] Face  - [place] NPO Repeat CT scan in 12 hours Aspen collar  Lido Maske K. 01/05/2016, 6:24 AM   Procedures

## 2016-01-05 NOTE — ED Notes (Signed)
Aspen collar applied

## 2016-01-05 NOTE — ED Notes (Signed)
RN escorted pt to CT.

## 2016-01-05 NOTE — Progress Notes (Signed)
   01/05/16 0216  Clinical Encounter Type  Visited With Patient and family together  Visit Type ED  Referral From Other (Comment) (level 2 trauma)  Spiritual Encounters  Spiritual Needs Prayer;Emotional  Stress Factors  Patient Stress Factors Exhausted  Family Stress Factors Family relationships;Major life changes  Provided emotional support to calm wife and offered prayer. Daughter and her husband in waiting room. Wife shared miracle stories.

## 2016-01-05 NOTE — ED Notes (Signed)
Pt's SpO2 dropped to 89% on room air. Pt placed on 2L [place], SpO2 back up to 94%.

## 2016-01-05 NOTE — ED Provider Notes (Signed)
[place] DEPT Provider Note   CSN: [id number] Arrival date & time: 01/05/16  [id number]  By signing my name below, I, Reola Mosher, attest that this documentation has been prepared under the direction and in the presence of Quintella Reichert, MD. Electronically Signed: Reola Mosher, ED Scribe. 01/05/16. 2:42 AM.  History   Chief Complaint Chief Complaint  Patient presents with  . Fall   LEVEL 5 CAVEAT: HPI and ROS limited due to EtOH intoxication   The history is provided by the patient and the EMS personnel. No language interpreter was used.   HPI Comments: Patrick [name] is a 60 y.o. male BIB EMS, who presents to the Emergency Department s/p mechanical fall down approximately 7+ stairs that occurred just PTA. Per EMS, pt was walking down a flight of stairs when he suddenly fell forwards, rolling down several steps, and landing onto the front of his face. He sustained several injures to the head during this incident. Family notes that he has been acting increasingly lethargic since the fall; however, he had been drinking EtOH prior to this. Pt is unable to estimate how much he had been drinking. He was not ambulatory after the incident. Pt is not currently on anticoagulant or antiplatelet therapy. NKDA per pt. He denies CP, pain otherwise at bedside. Vitals en route were noted as 134/88 BP, 72 HR, 15 RR at 98% O2Sat on 10L [place].   Past Medical History:  Diagnosis Date  . Hyperlipidemia     Patient Active Problem List   Diagnosis Date Noted  . Subarachnoid hemorrhage following injury with brief loss of consciousness but without open intracranial wound ([place]) 01/05/2016   History reviewed. No pertinent surgical history.   Home Medications    Prior to Admission medications   Medication Sig Start Date End Date Taking? Authorizing Provider  aspirin 325 MG tablet Take 325 mg by mouth daily.   Yes Historical Provider, MD  Coenzyme Q10 (EQL COQ10) 300 MG CAPS Take 1  capsule by mouth daily.   Yes Historical Provider, MD  rosuvastatin (CRESTOR) 20 MG tablet Take 20 mg by mouth daily.   Yes Historical Provider, MD  tamsulosin (FLOMAX) 0.4 MG CAPS capsule Take 0.4 mg by mouth.   Yes Historical Provider, MD   Family History No family history on file.  Social History Social History  Substance Use Topics  . Smoking status: Never Smoker  . Smokeless tobacco: Never Used  . Alcohol use Yes   Allergies   Review of patient's allergies indicates no known allergies.  Review of Systems Review of Systems  Unable to perform ROS: Other   Physical Exam Updated Vital Signs BP 132/77 (BP Location: Left Arm)   Pulse 68   Temp 98.1 F (36.7 C) (Oral)   Resp 15   Ht 6' (1.829 m)   Wt 181 lb 10.5 oz (82.4 kg)   SpO2 96%   BMI 24.64 kg/m   Physical Exam  Constitutional: He appears well-developed and well-nourished. Backboard in place.  HENT:  Head: Normocephalic.  Airway is clear and intact. Dried blood in bilateral nares. Left periorbital eccymosis and associated swelling.   Eyes: Pupils are equal, round, and reactive to light.  Cardiovascular: Normal rate and regular rhythm.   No murmur heard. Pulmonary/Chest: Effort normal and breath sounds normal. No respiratory distress.  Abdominal: Soft. There is no tenderness. There is no rebound and no guarding.  Musculoskeletal: He exhibits no edema or tenderness.  No C, T, L spine  tenderness to palpation.   Neurological:  Disoriented to place and time. Follows simple commands and eyes open to command. MAE symmetrically  Skin: Skin is warm and dry.  Psychiatric: He has a normal mood and affect. His behavior is normal.  Nursing note and vitals reviewed.  ED Treatments / Results  DIAGNOSTIC STUDIES: Oxygen Saturation is 98% on 10LO2NC, normal by my interpretation.   Labs (all labs ordered are listed, but only abnormal results are displayed) Labs Reviewed  COMPREHENSIVE METABOLIC PANEL - Abnormal;  Notable for the following:       Result Value   Glucose, Bld 162 (*)    Calcium 8.8 (*)    ALT 16 (*)    All other components within normal limits  ETHANOL - Abnormal; Notable for the following:    Alcohol, Ethyl (B) 285 (*)    All other components within normal limits  MRSA PCR SCREENING  CBC WITH DIFFERENTIAL/PLATELET  URINALYSIS, ROUTINE W REFLEX MICROSCOPIC (NOT AT [facility])   EKG  EKG Interpretation None      Radiology Ct Head Wo Contrast  Result Date: 01/05/2016 CLINICAL DATA:  Trip and fall injury down steps. Injuries to the head. Increasingly lethargic since the fall. Alcohol use. EXAM: CT HEAD WITHOUT CONTRAST CT MAXILLOFACIAL WITHOUT CONTRAST CT CERVICAL SPINE WITHOUT CONTRAST TECHNIQUE: Multidetector CT imaging of the head, cervical spine, and maxillofacial structures were performed using the standard protocol without intravenous contrast. Multiplanar CT image reconstructions of the cervical spine and maxillofacial structures were also generated. COMPARISON:  None. FINDINGS: CT HEAD FINDINGS Brain: Pneumocephalus in the skullbase. Acute subarachnoid hemorrhage filling suprasellar cisterns and anterior basal subarachnoid spaces. Can't exclude small intraparenchymal hematomas around the periphery. No mass effect or midline shift. Gray-white matter junctions are distinct. Basal cisterns are not effaced. Ventricles are not dilated. Vascular: No hyperdense vessel or unexpected calcification. Skull: Mildly depressed basal skull fractures involving the greater wing of the sphenoid bone extending to the anterior temporal bone at the temporomandibular joint. Fractures across the base of the clivus extending from right to left. Non depressed fractures of the left sphenoid bone involving the greater wing, extending to the sphenoid sinus and to the temporomandibular joint. Calvarium appears intact. Sinuses/Orbits: See below. Mastoid air cells are not opacified. External and middle ear cavities are  not opacified. Other: Examination is technically limited due to significant motion artifact throughout the examination. CT MAXILLOFACIAL FINDINGS Osseous: Depressed anterior nasal bone fractures. Fractures of the medial orbital walls bilaterally and of the left superior and inferior orbital walls. Fractures of the lateral, anterior, and inferior maxillary antral walls bilaterally. Fractures of the pterygoid plates on the right. Displaced fractures of the posterior nasal septum. Fracture of the right hard palate and maxilla. Mandibles and temporomandibular joints appear intact. Orbits: Globes and extraocular muscles appear intact and symmetrical. The no evidence of herniation or entrapment of the extraocular muscles. Bilateral periorbital hematomas and emphysematous changes. Bilateral extraconal retrobulbar emphysema, greater on the left. Sinuses: Opacification of the left frontal, bilateral ethmoid, bilateral sphenoid, and bilateral maxillary sinuses. Soft tissues: Prominent soft tissue swelling and subcutaneous emphysema in the facial regions inferior to the orbits and along the cheeks bilaterally. Soft tissue emphysema demonstrated in the masticator muscle spaces and along the right mandible. Limited intracranial: See above. Other:  Examination is limited due to motion artifact. CT CERVICAL SPINE FINDINGS Alignment: Normal Skull base and vertebrae: Fractures of the skullbase as previously discussed. No vertebral compression deformities. Ununited ossicles are demonstrated at the anterior  inferior endplate of C3 and at the anterior superior endplate of C6 which are not definitely corticated and could represent corner fractures. The posterior elements appear intact. C1-2 articulation appears intact. Soft tissues and spinal canal: Gas bubbles demonstrated within the central canal at the level of C2 through C5. This likely represents gas within the CSF space, tracking down from the intracranial contents. No  prevertebral soft tissue swelling. No central canal hematoma demonstrated. Disc levels: Mild degenerative changes in the cervical spine with mild endplate hypertrophic changes present. Upper chest: Emphysematous changes in the lung apices. Other: Examination is limited due to motion artifact. IMPRESSION: CT head: Acute intracranial subarachnoid and possibly intraparenchymal hemorrhages along the anterior frontal region. Acute hemorrhage in the suprasellar cisterns. Pneumocephalus. No mass-effect or midline shift. CT maxillary: Fractures as described throughout the skull base involving the sphenoid bones, clivus, and temporomandibular joints. Fractures of bilateral orbits and maxillary antral walls as described. Fractures also involve the pterygoid plates on the right. Associated soft tissue hematomas and soft tissue emphysema in the orbital and facial regions as described. CT cervical spine: Normal alignment. No acute displaced fractures, but cannot exclude anterior superior endplate corner fracture at C6 and anterior inferior endplate on a fracture at C3. Gas within the spinal canal, probably within the CSF space. These results were called by telephone at the time of interpretation on 01/05/2016 at 3:49 am to Dr. Quintella Reichert , who verbally acknowledged these results. Electronically Signed   By: Lucienne Capers M.D.   On: 01/05/2016 04:09   Ct Cervical Spine Wo Contrast  Result Date: 01/05/2016 CLINICAL DATA:  Trip and fall injury down steps. Injuries to the head. Increasingly lethargic since the fall. Alcohol use. EXAM: CT HEAD WITHOUT CONTRAST CT MAXILLOFACIAL WITHOUT CONTRAST CT CERVICAL SPINE WITHOUT CONTRAST TECHNIQUE: Multidetector CT imaging of the head, cervical spine, and maxillofacial structures were performed using the standard protocol without intravenous contrast. Multiplanar CT image reconstructions of the cervical spine and maxillofacial structures were also generated. COMPARISON:  None.  FINDINGS: CT HEAD FINDINGS Brain: Pneumocephalus in the skullbase. Acute subarachnoid hemorrhage filling suprasellar cisterns and anterior basal subarachnoid spaces. Can't exclude small intraparenchymal hematomas around the periphery. No mass effect or midline shift. Gray-white matter junctions are distinct. Basal cisterns are not effaced. Ventricles are not dilated. Vascular: No hyperdense vessel or unexpected calcification. Skull: Mildly depressed basal skull fractures involving the greater wing of the sphenoid bone extending to the anterior temporal bone at the temporomandibular joint. Fractures across the base of the clivus extending from right to left. Non depressed fractures of the left sphenoid bone involving the greater wing, extending to the sphenoid sinus and to the temporomandibular joint. Calvarium appears intact. Sinuses/Orbits: See below. Mastoid air cells are not opacified. External and middle ear cavities are not opacified. Other: Examination is technically limited due to significant motion artifact throughout the examination. CT MAXILLOFACIAL FINDINGS Osseous: Depressed anterior nasal bone fractures. Fractures of the medial orbital walls bilaterally and of the left superior and inferior orbital walls. Fractures of the lateral, anterior, and inferior maxillary antral walls bilaterally. Fractures of the pterygoid plates on the right. Displaced fractures of the posterior nasal septum. Fracture of the right hard palate and maxilla. Mandibles and temporomandibular joints appear intact. Orbits: Globes and extraocular muscles appear intact and symmetrical. The no evidence of herniation or entrapment of the extraocular muscles. Bilateral periorbital hematomas and emphysematous changes. Bilateral extraconal retrobulbar emphysema, greater on the left. Sinuses: Opacification of the left frontal, bilateral ethmoid,  bilateral sphenoid, and bilateral maxillary sinuses. Soft tissues: Prominent soft tissue swelling  and subcutaneous emphysema in the facial regions inferior to the orbits and along the cheeks bilaterally. Soft tissue emphysema demonstrated in the masticator muscle spaces and along the right mandible. Limited intracranial: See above. Other:  Examination is limited due to motion artifact. CT CERVICAL SPINE FINDINGS Alignment: Normal Skull base and vertebrae: Fractures of the skullbase as previously discussed. No vertebral compression deformities. Ununited ossicles are demonstrated at the anterior inferior endplate of C3 and at the anterior superior endplate of C6 which are not definitely corticated and could represent corner fractures. The posterior elements appear intact. C1-2 articulation appears intact. Soft tissues and spinal canal: Gas bubbles demonstrated within the central canal at the level of C2 through C5. This likely represents gas within the CSF space, tracking down from the intracranial contents. No prevertebral soft tissue swelling. No central canal hematoma demonstrated. Disc levels: Mild degenerative changes in the cervical spine with mild endplate hypertrophic changes present. Upper chest: Emphysematous changes in the lung apices. Other: Examination is limited due to motion artifact. IMPRESSION: CT head: Acute intracranial subarachnoid and possibly intraparenchymal hemorrhages along the anterior frontal region. Acute hemorrhage in the suprasellar cisterns. Pneumocephalus. No mass-effect or midline shift. CT maxillary: Fractures as described throughout the skull base involving the sphenoid bones, clivus, and temporomandibular joints. Fractures of bilateral orbits and maxillary antral walls as described. Fractures also involve the pterygoid plates on the right. Associated soft tissue hematomas and soft tissue emphysema in the orbital and facial regions as described. CT cervical spine: Normal alignment. No acute displaced fractures, but cannot exclude anterior superior endplate corner fracture at C6  and anterior inferior endplate on a fracture at C3. Gas within the spinal canal, probably within the CSF space. These results were called by telephone at the time of interpretation on 01/05/2016 at 3:49 am to Dr. Quintella Reichert , who verbally acknowledged these results. Electronically Signed   By: Lucienne Capers M.D.   On: 01/05/2016 04:09   Dg Chest Port 1 View  Result Date: 01/05/2016 CLINICAL DATA:  Altered mental status after fall down stairs. Alcohol use. EXAM: PORTABLE CHEST 1 VIEW COMPARISON:  None. FINDINGS: Shallow inspiration. Borderline heart size with normal pulmonary vascularity, likely normal for technique. No blunting of costophrenic angles. No pneumothorax. No focal airspace disease or consolidation in the lungs. Calcified and tortuous aorta. IMPRESSION: Shallow inspiration.  No evidence of active pulmonary disease. Electronically Signed   By: Lucienne Capers M.D.   On: 01/05/2016 03:10   Ct Maxillofacial Wo Cm  Result Date: 01/05/2016 CLINICAL DATA:  Trip and fall injury down steps. Injuries to the head. Increasingly lethargic since the fall. Alcohol use. EXAM: CT HEAD WITHOUT CONTRAST CT MAXILLOFACIAL WITHOUT CONTRAST CT CERVICAL SPINE WITHOUT CONTRAST TECHNIQUE: Multidetector CT imaging of the head, cervical spine, and maxillofacial structures were performed using the standard protocol without intravenous contrast. Multiplanar CT image reconstructions of the cervical spine and maxillofacial structures were also generated. COMPARISON:  None. FINDINGS: CT HEAD FINDINGS Brain: Pneumocephalus in the skullbase. Acute subarachnoid hemorrhage filling suprasellar cisterns and anterior basal subarachnoid spaces. Can't exclude small intraparenchymal hematomas around the periphery. No mass effect or midline shift. Gray-white matter junctions are distinct. Basal cisterns are not effaced. Ventricles are not dilated. Vascular: No hyperdense vessel or unexpected calcification. Skull: Mildly depressed  basal skull fractures involving the greater wing of the sphenoid bone extending to the anterior temporal bone at the temporomandibular joint. Fractures across  the base of the clivus extending from right to left. Non depressed fractures of the left sphenoid bone involving the greater wing, extending to the sphenoid sinus and to the temporomandibular joint. Calvarium appears intact. Sinuses/Orbits: See below. Mastoid air cells are not opacified. External and middle ear cavities are not opacified. Other: Examination is technically limited due to significant motion artifact throughout the examination. CT MAXILLOFACIAL FINDINGS Osseous: Depressed anterior nasal bone fractures. Fractures of the medial orbital walls bilaterally and of the left superior and inferior orbital walls. Fractures of the lateral, anterior, and inferior maxillary antral walls bilaterally. Fractures of the pterygoid plates on the right. Displaced fractures of the posterior nasal septum. Fracture of the right hard palate and maxilla. Mandibles and temporomandibular joints appear intact. Orbits: Globes and extraocular muscles appear intact and symmetrical. The no evidence of herniation or entrapment of the extraocular muscles. Bilateral periorbital hematomas and emphysematous changes. Bilateral extraconal retrobulbar emphysema, greater on the left. Sinuses: Opacification of the left frontal, bilateral ethmoid, bilateral sphenoid, and bilateral maxillary sinuses. Soft tissues: Prominent soft tissue swelling and subcutaneous emphysema in the facial regions inferior to the orbits and along the cheeks bilaterally. Soft tissue emphysema demonstrated in the masticator muscle spaces and along the right mandible. Limited intracranial: See above. Other:  Examination is limited due to motion artifact. CT CERVICAL SPINE FINDINGS Alignment: Normal Skull base and vertebrae: Fractures of the skullbase as previously discussed. No vertebral compression deformities.  Ununited ossicles are demonstrated at the anterior inferior endplate of C3 and at the anterior superior endplate of C6 which are not definitely corticated and could represent corner fractures. The posterior elements appear intact. C1-2 articulation appears intact. Soft tissues and spinal canal: Gas bubbles demonstrated within the central canal at the level of C2 through C5. This likely represents gas within the CSF space, tracking down from the intracranial contents. No prevertebral soft tissue swelling. No central canal hematoma demonstrated. Disc levels: Mild degenerative changes in the cervical spine with mild endplate hypertrophic changes present. Upper chest: Emphysematous changes in the lung apices. Other: Examination is limited due to motion artifact. IMPRESSION: CT head: Acute intracranial subarachnoid and possibly intraparenchymal hemorrhages along the anterior frontal region. Acute hemorrhage in the suprasellar cisterns. Pneumocephalus. No mass-effect or midline shift. CT maxillary: Fractures as described throughout the skull base involving the sphenoid bones, clivus, and temporomandibular joints. Fractures of bilateral orbits and maxillary antral walls as described. Fractures also involve the pterygoid plates on the right. Associated soft tissue hematomas and soft tissue emphysema in the orbital and facial regions as described. CT cervical spine: Normal alignment. No acute displaced fractures, but cannot exclude anterior superior endplate corner fracture at C6 and anterior inferior endplate on a fracture at C3. Gas within the spinal canal, probably within the CSF space. These results were called by telephone at the time of interpretation on 01/05/2016 at 3:49 am to Dr. Quintella Reichert , who verbally acknowledged these results. Electronically Signed   By: Lucienne Capers M.D.   On: 01/05/2016 04:09    Procedures Procedures (including critical care time) CRITICAL CARE Performed by: Quintella Reichert   Total critical care time: 35 minutes  Critical care time was exclusive of separately billable procedures and treating other patients.  Critical care was necessary to treat or prevent imminent or life-threatening deterioration.  Critical care was time spent personally by me on the following activities: development of treatment plan with patient and/or surrogate as well as nursing, discussions with consultants, evaluation of patient's  response to treatment, examination of patient, obtaining history from patient or surrogate, ordering and performing treatments and interventions, ordering and review of laboratory studies, ordering and review of radiographic studies, pulse oximetry and re-evaluation of patient's condition.  Medications Ordered in ED Medications  ondansetron (ZOFRAN) injection 4 mg (not administered)  0.9 % NaCl with KCl 20 mEq/ L  infusion (not administered)  morphine 2 MG/ML injection 2-4 mg (not administered)  ondansetron (ZOFRAN) tablet 4 mg (not administered)    Or  ondansetron (ZOFRAN) injection 4 mg (not administered)  ondansetron (ZOFRAN) 4 MG/2ML injection (4 mg  Given 01/05/16 0315)  fentaNYL (SUBLIMAZE) injection 25 mcg (25 mcg Intravenous Given 01/05/16 0558)    Initial Impression / Assessment and Plan / ED Course  I have reviewed the triage vital signs and the nursing notes.  Pertinent labs & imaging results that were available during my care of the patient were reviewed by me and considered in my medical decision making (see chart for details).  Clinical Course    Patient here for evaluation of injuries following a fall. He is intoxicated and altered on examination with a GCS of 12-13. CT head was subarachnoid hemorrhage with multiple facial fractures. Examination he does not appear to have significant facial tenderness but his swelling has progressed during his ED stay. During ED stay he developed serosanguineous drainage from bilateral nares concerning  for CSF leak. Discussed the case with Dr. Arnoldo Morale with neurosurgery who recommends a hard collar until he can be clinically reassessed. He will evaluate the patient in consult. Discussed with Dr. Georgette Dover with trauma surgery evaluated the patient and will admit for further management. Discussed with Dr. Migdalia Dk- Dillingham with ENT who will evaluate the patient in consult.  Final Clinical Impressions(s) / ED Diagnoses   Final diagnoses:  Subarachnoid hemorrhage following injury with brief loss of consciousness but without open intracranial wound [facility])    New Prescriptions Current Discharge Medication List     I personally performed the services described in this documentation, which was scribed in my presence. The recorded information has been reviewed and is accurate.     Quintella Reichert, MD 01/05/16 0830

## 2016-01-05 NOTE — ED Triage Notes (Signed)
Per EMS, pt from home with c/o altered mental status after a fall down about 7 stairs. Pt alert to self upon arrival to this ED.

## 2016-01-06 ENCOUNTER — Other Ambulatory Visit: Payer: Self-pay | Admitting: Plastic Surgery

## 2016-01-06 ENCOUNTER — Ambulatory Visit: Payer: Self-pay | Admitting: Plastic Surgery

## 2016-01-06 DIAGNOSIS — S0292XA Unspecified fracture of facial bones, initial encounter for closed fracture: Secondary | ICD-10-CM

## 2016-01-06 LAB — COMPREHENSIVE METABOLIC PANEL
ALK PHOS: 39 U/L (ref 38–126)
ALT: 16 U/L — ABNORMAL LOW (ref 17–63)
AST: 25 U/L (ref 15–41)
Albumin: 3.9 g/dL (ref 3.5–5.0)
Anion gap: 8 (ref 5–15)
BUN: 15 mg/dL (ref 6–20)
CALCIUM: 8.7 mg/dL — AB (ref 8.9–10.3)
CO2: 28 mmol/L (ref 22–32)
CREATININE: 1 mg/dL (ref 0.61–1.24)
Chloride: 108 mmol/L (ref 101–111)
GFR calc non Af Amer: >60 mL/min (ref >60)
Glucose, Bld: 143 mg/dL — ABNORMAL HIGH (ref 65–99)
Potassium: 4 mmol/L (ref 3.5–5.1)
SODIUM: 144 mmol/L (ref 135–145)
TOTAL PROTEIN: 6.4 g/dL — AB (ref 6.5–8.1)
Total Bilirubin: 0.7 mg/dL (ref 0.3–1.2)

## 2016-01-06 LAB — CBC
HCT: 38.9 % — ABNORMAL LOW (ref 39.0–52.0)
Hemoglobin: 12.7 g/dL — ABNORMAL LOW (ref 13.0–17.0)
MCH: 29.7 pg (ref 26.0–34.0)
MCHC: 32.6 g/dL (ref 30.0–36.0)
MCV: 91.1 fL (ref 78.0–100.0)
Platelets: 168 10*3/uL (ref 150–400)
RBC: 4.27 MIL/uL (ref 4.22–5.81)
RDW: 13.2 % (ref 11.5–15.5)
WBC: 12.7 10*3/uL — ABNORMAL HIGH (ref 4.0–10.5)

## 2016-01-06 LAB — PROTIME-INR
INR: 1.06
Prothrombin Time: 13.8 s (ref 11.4–15.2)

## 2016-01-06 MED ORDER — CHLORHEXIDINE GLUCONATE CLOTH 2 % EX PADS
6.0000 | MEDICATED_PAD | Freq: Once | CUTANEOUS | Status: AC
  Administered 2016-01-06: 6 via TOPICAL

## 2016-01-06 MED ORDER — CHLORHEXIDINE GLUCONATE CLOTH 2 % EX PADS
6.0000 | MEDICATED_PAD | Freq: Once | CUTANEOUS | Status: AC
  Administered 2016-01-07: 6 via TOPICAL

## 2016-01-06 MED ORDER — CEFAZOLIN SODIUM-DEXTROSE 2-4 GM/100ML-% IV SOLN
2.0000 g | INTRAVENOUS | Status: AC
  Administered 2016-01-07: 2 g via INTRAVENOUS
  Filled 2016-01-06: qty 100

## 2016-01-06 MED ORDER — INFLUENZA VAC SPLIT QUAD 0.5 ML IM SUSY
0.5000 mL | PREFILLED_SYRINGE | INTRAMUSCULAR | Status: AC
Start: 2016-01-07 — End: 2016-01-08
  Administered 2016-01-08: 0.5 mL via INTRAMUSCULAR
  Filled 2016-01-06 (×2): qty 0.5
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
## 2016-01-06 NOTE — Progress Notes (Signed)
Subjective: Mild improvement overall,  Up to chair drinking with straw.  Objective: Vital signs in last 24 hours: Temp:  [98.1 F (36.7 C)-98.9 F (37.2 C)] 98.9 F (37.2 C) (10/01 1200) Pulse Rate:  [52-77] 66 (10/01 1400) Resp:  [9-41] 11 (10/01 1400) BP: (121-152)/(59-92) 128/63 (10/01 1400) SpO2:  [87 %-100 %] 93 % (10/01 1400) Last BM Date: 01/04/16  Intake/Output from previous day: 09/30 0701 - 10/01 0700 In: 2250 [I.V.:2250] Out: 1275 [Urine:1275] Intake/Output this shift: Total I/O In: 700 [I.V.:700] Out: -   Head: swelling and bruising as expected.  Lab Results:   Recent Labs  01/05/16 0244 01/06/16 0312  WBC 6.9 12.7*  HGB 14.3 12.7*  HCT 43.6 38.9*  PLT 178 168   BMET  Recent Labs  01/05/16 0244 01/06/16 0312  NA 142 144  K 3.7 4.0  CL 111 108  CO2 23 28  GLUCOSE 162* 143*  BUN 13 15  CREATININE 0.99 1.00  CALCIUM 8.8* 8.7*   PT/INR  Recent Labs  01/06/16 0312  LABPROT 13.8  INR 1.06   ABG No results for input(s): PHART, HCO3 in the last 72 hours.  Invalid input(s): PCO2, PO2  Studies/Results: Ct Head Without Contrast  Result Date: 01/05/2016 CLINICAL DATA:  Fall down stairs last night. Follow-up head bleed from last night. Repeat cervical spine as patient was unable to hold still for spine imaging last night. Patient with posterior neck pain. EXAM: CT HEAD WITHOUT CONTRAST CT CERVICAL SPINE WITHOUT CONTRAST TECHNIQUE: Multidetector CT imaging of the head and cervical spine was performed following the standard protocol without intravenous contrast. Multiplanar CT image reconstructions of the cervical spine were also generated. COMPARISON:  Head CT from earlier same day. FINDINGS: CT HEAD FINDINGS Brain: There is significantly increased intraparenchymal hemorrhage within the lower left frontal lobe and left temporal lobe, with surrounding edema. There is increased local mass effect. Smaller amount of parenchymal hemorrhage noted in the  anterior aspect of the right temporal lobe. The subarachnoid hemorrhage appears stable within the suprasellar cistern and anterior basal subarachnoid spaces. Now seen is a small amount of additional subarachnoid hemorrhage within the sulci of the parietal lobes bilaterally, at and just above the level of the lateral ventricles. Ventricles are stable in size and configuration. Small amount of air now appreciated in the ventricles. There is slightly increased pneumocephalus at the skullbase due to the adjacent skullbase fractures and sinus fractures. Vascular: No hyperdense vessel or unexpected calcification. Skull: Again noted are the slightly displaced/depressed fractures involving the greater wing of the sphenoid bone extending to the anterior temple bones adjacent to each TMJ. Fracture again noted across the base of the clivus. Again noted are the slightly displaced fractures along the posterior and inferior margin of the sphenoid sinus. Associated fluid again appreciated throughout the paranasal sinuses. There is again no calvarial fracture identified. Sinuses/Orbits: As above. Numerous facial bone fractures, as described in detail on earlier maxillofacial CT report. Other: None CT CERVICAL SPINE FINDINGS Alignment: Alignment of the cervical spine is normal. Skull base and vertebrae: No fracture line or displaced fracture fragment identified within the cervical spine. As described on yesterday's exam, there are multiple skullbase fractures including a slightly displaced fracture through the base of the clivus. Also now appreciated are slightly displaced fractures within the right occipital condyle and a minimally displaced fracture within the far outer aspect of the left occipital condyle. Soft tissues and spinal canal: No prevertebral fluid or swelling. No visible canal hematoma. Disc  levels: No significant degenerative change. No central canal stenosis. Upper chest: Unremarkable. Mild emphysematous change at  the lung apices. Other: None IMPRESSION: 1. Significantly increased intraparenchymal hemorrhage within the lower left frontal lobe and left temporal lobe, with surrounding edema, now with associated local mass effect. Smaller amount of parenchymal hemorrhage within the anterior aspects of the right temporal lobe. 2. Stable appearance of the subarachnoid hemorrhage within the suprasellar cistern and anterior basal subarachnoid spaces. Now appreciated is an additional small amount of posttraumatic subarachnoid hemorrhage within the sulci of the parietal lobes bilaterally, at the level of the lateral ventricles. 3. Numerous skull base fractures and facial bone fractures without significant change compared to the earlier exam, as described in detail on the earlier exam. 4. Additional minimally displaced fractures now noted within the right occipital condyle. No malalignment at the adjacent atlanto occipital joint space. Underlying C1 vertebral body appears intact and normally aligned. Additional nondisplaced fracture noted at the far lateral aspect of the left occipital condyle. 5. No fracture or acute subluxation identified in the cervical spine. These results were called by telephone at the time of interpretation on 01/05/2016 at 8:13 pm to Dr. Georganna Skeans , who verbally acknowledged these results. Electronically Signed   By: Franki Cabot M.D.   On: 01/05/2016 20:16   Ct Head Wo Contrast  Result Date: 01/05/2016 CLINICAL DATA:  Trip and fall injury down steps. Injuries to the head. Increasingly lethargic since the fall. Alcohol use. EXAM: CT HEAD WITHOUT CONTRAST CT MAXILLOFACIAL WITHOUT CONTRAST CT CERVICAL SPINE WITHOUT CONTRAST TECHNIQUE: Multidetector CT imaging of the head, cervical spine, and maxillofacial structures were performed using the standard protocol without intravenous contrast. Multiplanar CT image reconstructions of the cervical spine and maxillofacial structures were also generated.  COMPARISON:  None. FINDINGS: CT HEAD FINDINGS Brain: Pneumocephalus in the skullbase. Acute subarachnoid hemorrhage filling suprasellar cisterns and anterior basal subarachnoid spaces. Can't exclude small intraparenchymal hematomas around the periphery. No mass effect or midline shift. Gray-white matter junctions are distinct. Basal cisterns are not effaced. Ventricles are not dilated. Vascular: No hyperdense vessel or unexpected calcification. Skull: Mildly depressed basal skull fractures involving the greater wing of the sphenoid bone extending to the anterior temporal bone at the temporomandibular joint. Fractures across the base of the clivus extending from right to left. Non depressed fractures of the left sphenoid bone involving the greater wing, extending to the sphenoid sinus and to the temporomandibular joint. Calvarium appears intact. Sinuses/Orbits: See below. Mastoid air cells are not opacified. External and middle ear cavities are not opacified. Other: Examination is technically limited due to significant motion artifact throughout the examination. CT MAXILLOFACIAL FINDINGS Osseous: Depressed anterior nasal bone fractures. Fractures of the medial orbital walls bilaterally and of the left superior and inferior orbital walls. Fractures of the lateral, anterior, and inferior maxillary antral walls bilaterally. Fractures of the pterygoid plates on the right. Displaced fractures of the posterior nasal septum. Fracture of the right hard palate and maxilla. Mandibles and temporomandibular joints appear intact. Orbits: Globes and extraocular muscles appear intact and symmetrical. The no evidence of herniation or entrapment of the extraocular muscles. Bilateral periorbital hematomas and emphysematous changes. Bilateral extraconal retrobulbar emphysema, greater on the left. Sinuses: Opacification of the left frontal, bilateral ethmoid, bilateral sphenoid, and bilateral maxillary sinuses. Soft tissues: Prominent  soft tissue swelling and subcutaneous emphysema in the facial regions inferior to the orbits and along the cheeks bilaterally. Soft tissue emphysema demonstrated in the masticator muscle spaces and along the  right mandible. Limited intracranial: See above. Other:  Examination is limited due to motion artifact. CT CERVICAL SPINE FINDINGS Alignment: Normal Skull base and vertebrae: Fractures of the skullbase as previously discussed. No vertebral compression deformities. Ununited ossicles are demonstrated at the anterior inferior endplate of C3 and at the anterior superior endplate of C6 which are not definitely corticated and could represent corner fractures. The posterior elements appear intact. C1-2 articulation appears intact. Soft tissues and spinal canal: Gas bubbles demonstrated within the central canal at the level of C2 through C5. This likely represents gas within the CSF space, tracking down from the intracranial contents. No prevertebral soft tissue swelling. No central canal hematoma demonstrated. Disc levels: Mild degenerative changes in the cervical spine with mild endplate hypertrophic changes present. Upper chest: Emphysematous changes in the lung apices. Other: Examination is limited due to motion artifact. IMPRESSION: CT head: Acute intracranial subarachnoid and possibly intraparenchymal hemorrhages along the anterior frontal region. Acute hemorrhage in the suprasellar cisterns. Pneumocephalus. No mass-effect or midline shift. CT maxillary: Fractures as described throughout the skull base involving the sphenoid bones, clivus, and temporomandibular joints. Fractures of bilateral orbits and maxillary antral walls as described. Fractures also involve the pterygoid plates on the right. Associated soft tissue hematomas and soft tissue emphysema in the orbital and facial regions as described. CT cervical spine: Normal alignment. No acute displaced fractures, but cannot exclude anterior superior endplate  corner fracture at C6 and anterior inferior endplate on a fracture at C3. Gas within the spinal canal, probably within the CSF space. These results were called by telephone at the time of interpretation on 01/05/2016 at 3:49 am to Dr. Quintella Reichert , who verbally acknowledged these results. Electronically Signed   By: Lucienne Capers M.D.   On: 01/05/2016 04:09   Ct Cervical Spine Wo Contrast  Result Date: 01/05/2016 CLINICAL DATA:  Fall down stairs last night. Follow-up head bleed from last night. Repeat cervical spine as patient was unable to hold still for spine imaging last night. Patient with posterior neck pain. EXAM: CT HEAD WITHOUT CONTRAST CT CERVICAL SPINE WITHOUT CONTRAST TECHNIQUE: Multidetector CT imaging of the head and cervical spine was performed following the standard protocol without intravenous contrast. Multiplanar CT image reconstructions of the cervical spine were also generated. COMPARISON:  Head CT from earlier same day. FINDINGS: CT HEAD FINDINGS Brain: There is significantly increased intraparenchymal hemorrhage within the lower left frontal lobe and left temporal lobe, with surrounding edema. There is increased local mass effect. Smaller amount of parenchymal hemorrhage noted in the anterior aspect of the right temporal lobe. The subarachnoid hemorrhage appears stable within the suprasellar cistern and anterior basal subarachnoid spaces. Now seen is a small amount of additional subarachnoid hemorrhage within the sulci of the parietal lobes bilaterally, at and just above the level of the lateral ventricles. Ventricles are stable in size and configuration. Small amount of air now appreciated in the ventricles. There is slightly increased pneumocephalus at the skullbase due to the adjacent skullbase fractures and sinus fractures. Vascular: No hyperdense vessel or unexpected calcification. Skull: Again noted are the slightly displaced/depressed fractures involving the greater wing of the  sphenoid bone extending to the anterior temple bones adjacent to each TMJ. Fracture again noted across the base of the clivus. Again noted are the slightly displaced fractures along the posterior and inferior margin of the sphenoid sinus. Associated fluid again appreciated throughout the paranasal sinuses. There is again no calvarial fracture identified. Sinuses/Orbits: As above. Numerous facial bone  fractures, as described in detail on earlier maxillofacial CT report. Other: None CT CERVICAL SPINE FINDINGS Alignment: Alignment of the cervical spine is normal. Skull base and vertebrae: No fracture line or displaced fracture fragment identified within the cervical spine. As described on yesterday's exam, there are multiple skullbase fractures including a slightly displaced fracture through the base of the clivus. Also now appreciated are slightly displaced fractures within the right occipital condyle and a minimally displaced fracture within the far outer aspect of the left occipital condyle. Soft tissues and spinal canal: No prevertebral fluid or swelling. No visible canal hematoma. Disc levels: No significant degenerative change. No central canal stenosis. Upper chest: Unremarkable. Mild emphysematous change at the lung apices. Other: None IMPRESSION: 1. Significantly increased intraparenchymal hemorrhage within the lower left frontal lobe and left temporal lobe, with surrounding edema, now with associated local mass effect. Smaller amount of parenchymal hemorrhage within the anterior aspects of the right temporal lobe. 2. Stable appearance of the subarachnoid hemorrhage within the suprasellar cistern and anterior basal subarachnoid spaces. Now appreciated is an additional small amount of posttraumatic subarachnoid hemorrhage within the sulci of the parietal lobes bilaterally, at the level of the lateral ventricles. 3. Numerous skull base fractures and facial bone fractures without significant change compared to  the earlier exam, as described in detail on the earlier exam. 4. Additional minimally displaced fractures now noted within the right occipital condyle. No malalignment at the adjacent atlanto occipital joint space. Underlying C1 vertebral body appears intact and normally aligned. Additional nondisplaced fracture noted at the far lateral aspect of the left occipital condyle. 5. No fracture or acute subluxation identified in the cervical spine. These results were called by telephone at the time of interpretation on 01/05/2016 at 8:13 pm to Dr. Georganna Skeans , who verbally acknowledged these results. Electronically Signed   By: Franki Cabot M.D.   On: 01/05/2016 20:16   Ct Cervical Spine Wo Contrast  Result Date: 01/05/2016 CLINICAL DATA:  Trip and fall injury down steps. Injuries to the head. Increasingly lethargic since the fall. Alcohol use. EXAM: CT HEAD WITHOUT CONTRAST CT MAXILLOFACIAL WITHOUT CONTRAST CT CERVICAL SPINE WITHOUT CONTRAST TECHNIQUE: Multidetector CT imaging of the head, cervical spine, and maxillofacial structures were performed using the standard protocol without intravenous contrast. Multiplanar CT image reconstructions of the cervical spine and maxillofacial structures were also generated. COMPARISON:  None. FINDINGS: CT HEAD FINDINGS Brain: Pneumocephalus in the skullbase. Acute subarachnoid hemorrhage filling suprasellar cisterns and anterior basal subarachnoid spaces. Can't exclude small intraparenchymal hematomas around the periphery. No mass effect or midline shift. Gray-white matter junctions are distinct. Basal cisterns are not effaced. Ventricles are not dilated. Vascular: No hyperdense vessel or unexpected calcification. Skull: Mildly depressed basal skull fractures involving the greater wing of the sphenoid bone extending to the anterior temporal bone at the temporomandibular joint. Fractures across the base of the clivus extending from right to left. Non depressed fractures of  the left sphenoid bone involving the greater wing, extending to the sphenoid sinus and to the temporomandibular joint. Calvarium appears intact. Sinuses/Orbits: See below. Mastoid air cells are not opacified. External and middle ear cavities are not opacified. Other: Examination is technically limited due to significant motion artifact throughout the examination. CT MAXILLOFACIAL FINDINGS Osseous: Depressed anterior nasal bone fractures. Fractures of the medial orbital walls bilaterally and of the left superior and inferior orbital walls. Fractures of the lateral, anterior, and inferior maxillary antral walls bilaterally. Fractures of the pterygoid plates on the right.  Displaced fractures of the posterior nasal septum. Fracture of the right hard palate and maxilla. Mandibles and temporomandibular joints appear intact. Orbits: Globes and extraocular muscles appear intact and symmetrical. The no evidence of herniation or entrapment of the extraocular muscles. Bilateral periorbital hematomas and emphysematous changes. Bilateral extraconal retrobulbar emphysema, greater on the left. Sinuses: Opacification of the left frontal, bilateral ethmoid, bilateral sphenoid, and bilateral maxillary sinuses. Soft tissues: Prominent soft tissue swelling and subcutaneous emphysema in the facial regions inferior to the orbits and along the cheeks bilaterally. Soft tissue emphysema demonstrated in the masticator muscle spaces and along the right mandible. Limited intracranial: See above. Other:  Examination is limited due to motion artifact. CT CERVICAL SPINE FINDINGS Alignment: Normal Skull base and vertebrae: Fractures of the skullbase as previously discussed. No vertebral compression deformities. Ununited ossicles are demonstrated at the anterior inferior endplate of C3 and at the anterior superior endplate of C6 which are not definitely corticated and could represent corner fractures. The posterior elements appear intact. C1-2  articulation appears intact. Soft tissues and spinal canal: Gas bubbles demonstrated within the central canal at the level of C2 through C5. This likely represents gas within the CSF space, tracking down from the intracranial contents. No prevertebral soft tissue swelling. No central canal hematoma demonstrated. Disc levels: Mild degenerative changes in the cervical spine with mild endplate hypertrophic changes present. Upper chest: Emphysematous changes in the lung apices. Other: Examination is limited due to motion artifact. IMPRESSION: CT head: Acute intracranial subarachnoid and possibly intraparenchymal hemorrhages along the anterior frontal region. Acute hemorrhage in the suprasellar cisterns. Pneumocephalus. No mass-effect or midline shift. CT maxillary: Fractures as described throughout the skull base involving the sphenoid bones, clivus, and temporomandibular joints. Fractures of bilateral orbits and maxillary antral walls as described. Fractures also involve the pterygoid plates on the right. Associated soft tissue hematomas and soft tissue emphysema in the orbital and facial regions as described. CT cervical spine: Normal alignment. No acute displaced fractures, but cannot exclude anterior superior endplate corner fracture at C6 and anterior inferior endplate on a fracture at C3. Gas within the spinal canal, probably within the CSF space. These results were called by telephone at the time of interpretation on 01/05/2016 at 3:49 am to Dr. Quintella Reichert , who verbally acknowledged these results. Electronically Signed   By: Lucienne Capers M.D.   On: 01/05/2016 04:09   Dg Chest Port 1 View  Result Date: 01/05/2016 CLINICAL DATA:  Altered mental status after fall down stairs. Alcohol use. EXAM: PORTABLE CHEST 1 VIEW COMPARISON:  None. FINDINGS: Shallow inspiration. Borderline heart size with normal pulmonary vascularity, likely normal for technique. No blunting of costophrenic angles. No pneumothorax. No  focal airspace disease or consolidation in the lungs. Calcified and tortuous aorta. IMPRESSION: Shallow inspiration.  No evidence of active pulmonary disease. Electronically Signed   By: Lucienne Capers M.D.   On: 01/05/2016 03:10   Ct Maxillofacial Wo Cm  Result Date: 01/05/2016 CLINICAL DATA:  Trip and fall injury down steps. Injuries to the head. Increasingly lethargic since the fall. Alcohol use. EXAM: CT HEAD WITHOUT CONTRAST CT MAXILLOFACIAL WITHOUT CONTRAST CT CERVICAL SPINE WITHOUT CONTRAST TECHNIQUE: Multidetector CT imaging of the head, cervical spine, and maxillofacial structures were performed using the standard protocol without intravenous contrast. Multiplanar CT image reconstructions of the cervical spine and maxillofacial structures were also generated. COMPARISON:  None. FINDINGS: CT HEAD FINDINGS Brain: Pneumocephalus in the skullbase. Acute subarachnoid hemorrhage filling suprasellar cisterns and anterior basal subarachnoid  spaces. Can't exclude small intraparenchymal hematomas around the periphery. No mass effect or midline shift. Gray-white matter junctions are distinct. Basal cisterns are not effaced. Ventricles are not dilated. Vascular: No hyperdense vessel or unexpected calcification. Skull: Mildly depressed basal skull fractures involving the greater wing of the sphenoid bone extending to the anterior temporal bone at the temporomandibular joint. Fractures across the base of the clivus extending from right to left. Non depressed fractures of the left sphenoid bone involving the greater wing, extending to the sphenoid sinus and to the temporomandibular joint. Calvarium appears intact. Sinuses/Orbits: See below. Mastoid air cells are not opacified. External and middle ear cavities are not opacified. Other: Examination is technically limited due to significant motion artifact throughout the examination. CT MAXILLOFACIAL FINDINGS Osseous: Depressed anterior nasal bone fractures. Fractures  of the medial orbital walls bilaterally and of the left superior and inferior orbital walls. Fractures of the lateral, anterior, and inferior maxillary antral walls bilaterally. Fractures of the pterygoid plates on the right. Displaced fractures of the posterior nasal septum. Fracture of the right hard palate and maxilla. Mandibles and temporomandibular joints appear intact. Orbits: Globes and extraocular muscles appear intact and symmetrical. The no evidence of herniation or entrapment of the extraocular muscles. Bilateral periorbital hematomas and emphysematous changes. Bilateral extraconal retrobulbar emphysema, greater on the left. Sinuses: Opacification of the left frontal, bilateral ethmoid, bilateral sphenoid, and bilateral maxillary sinuses. Soft tissues: Prominent soft tissue swelling and subcutaneous emphysema in the facial regions inferior to the orbits and along the cheeks bilaterally. Soft tissue emphysema demonstrated in the masticator muscle spaces and along the right mandible. Limited intracranial: See above. Other:  Examination is limited due to motion artifact. CT CERVICAL SPINE FINDINGS Alignment: Normal Skull base and vertebrae: Fractures of the skullbase as previously discussed. No vertebral compression deformities. Ununited ossicles are demonstrated at the anterior inferior endplate of C3 and at the anterior superior endplate of C6 which are not definitely corticated and could represent corner fractures. The posterior elements appear intact. C1-2 articulation appears intact. Soft tissues and spinal canal: Gas bubbles demonstrated within the central canal at the level of C2 through C5. This likely represents gas within the CSF space, tracking down from the intracranial contents. No prevertebral soft tissue swelling. No central canal hematoma demonstrated. Disc levels: Mild degenerative changes in the cervical spine with mild endplate hypertrophic changes present. Upper chest: Emphysematous  changes in the lung apices. Other: Examination is limited due to motion artifact. IMPRESSION: CT head: Acute intracranial subarachnoid and possibly intraparenchymal hemorrhages along the anterior frontal region. Acute hemorrhage in the suprasellar cisterns. Pneumocephalus. No mass-effect or midline shift. CT maxillary: Fractures as described throughout the skull base involving the sphenoid bones, clivus, and temporomandibular joints. Fractures of bilateral orbits and maxillary antral walls as described. Fractures also involve the pterygoid plates on the right. Associated soft tissue hematomas and soft tissue emphysema in the orbital and facial regions as described. CT cervical spine: Normal alignment. No acute displaced fractures, but cannot exclude anterior superior endplate corner fracture at C6 and anterior inferior endplate on a fracture at C3. Gas within the spinal canal, probably within the CSF space. These results were called by telephone at the time of interpretation on 01/05/2016 at 3:49 am to Dr. Quintella Reichert , who verbally acknowledged these results. Electronically Signed   By: Lucienne Capers M.D.   On: 01/05/2016 04:09    Anti-infectives: Anti-infectives    None      Assessment/Plan: s/p * No surgery  found * OR tomorrow for facial fracture repair.  NPO after midnight.  LOS: 1 day    Wallace Going 01/06/2016
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
## 2016-01-06 NOTE — Progress Notes (Signed)
Patient ID: Patrick [name], male   DOB: 01/23/1956, 60 y.o.   MRN: [id number] Subjective:  The patient is alert and pleasant. He is in no apparent distress. He is hungry and wants to eat. He denies neck pain.  Objective: Vital signs in last 24 hours: Temp:  [97.5 F (36.4 C)-98.5 F (36.9 C)] 98.3 F (36.8 C) (10/01 0400) Pulse Rate:  [53-77] 53 (10/01 0700) Resp:  [9-31] 11 (10/01 0700) BP: (121-152)/(71-92) 128/82 (10/01 0700) SpO2:  [87 %-100 %] 98 % (10/01 0700) Weight:  [82.4 kg (181 lb 10.5 oz)] 82.4 kg (181 lb 10.5 oz) (09/30 0753)  Intake/Output from previous day: 09/30 0701 - 10/01 0700 In: 2250 [I.V.:2250] Out: 1275 [Urine:1275] Intake/Output this shift: No intake/output data recorded.  Physical exam the patient is alert and oriented 3. Glasgow Coma Scale 15. His pupils are equal. His speech is normal. He is moving all 4 extremities well.  I don't see any CSF otorrhea or rhinorrhea.  I have reviewed the patient's follow-up head CT and cervical CT performed yesterday. The patient's bifrontal contusions have enlarged a bit. He has a right occipital condyle fracture.  Lab Results:  Recent Labs  01/05/16 0244 01/06/16 0312  WBC 6.9 12.7*  HGB 14.3 12.7*  HCT 43.6 38.9*  PLT 178 168   BMET  Recent Labs  01/05/16 0244 01/06/16 0312  NA 142 144  K 3.7 4.0  CL 111 108  CO2 23 28  GLUCOSE 162* 143*  BUN 13 15  CREATININE 0.99 1.00  CALCIUM 8.8* 8.7*    Studies/Results: Ct Head Without Contrast  Result Date: 01/05/2016 CLINICAL DATA:  Fall down stairs last night. Follow-up head bleed from last night. Repeat cervical spine as patient was unable to hold still for spine imaging last night. Patient with posterior neck pain. EXAM: CT HEAD WITHOUT CONTRAST CT CERVICAL SPINE WITHOUT CONTRAST TECHNIQUE: Multidetector CT imaging of the head and cervical spine was performed following the standard protocol without intravenous contrast. Multiplanar CT image  reconstructions of the cervical spine were also generated. COMPARISON:  Head CT from earlier same day. FINDINGS: CT HEAD FINDINGS Brain: There is significantly increased intraparenchymal hemorrhage within the lower left frontal lobe and left temporal lobe, with surrounding edema. There is increased local mass effect. Smaller amount of parenchymal hemorrhage noted in the anterior aspect of the right temporal lobe. The subarachnoid hemorrhage appears stable within the suprasellar cistern and anterior basal subarachnoid spaces. Now seen is a small amount of additional subarachnoid hemorrhage within the sulci of the parietal lobes bilaterally, at and just above the level of the lateral ventricles. Ventricles are stable in size and configuration. Small amount of air now appreciated in the ventricles. There is slightly increased pneumocephalus at the skullbase due to the adjacent skullbase fractures and sinus fractures. Vascular: No hyperdense vessel or unexpected calcification. Skull: Again noted are the slightly displaced/depressed fractures involving the greater wing of the sphenoid bone extending to the anterior temple bones adjacent to each TMJ. Fracture again noted across the base of the clivus. Again noted are the slightly displaced fractures along the posterior and inferior margin of the sphenoid sinus. Associated fluid again appreciated throughout the paranasal sinuses. There is again no calvarial fracture identified. Sinuses/Orbits: As above. Numerous facial bone fractures, as described in detail on earlier maxillofacial CT report. Other: None CT CERVICAL SPINE FINDINGS Alignment: Alignment of the cervical spine is normal. Skull base and vertebrae: No fracture line or displaced fracture fragment identified within the  cervical spine. As described on yesterday's exam, there are multiple skullbase fractures including a slightly displaced fracture through the base of the clivus. Also now appreciated are slightly  displaced fractures within the right occipital condyle and a minimally displaced fracture within the far outer aspect of the left occipital condyle. Soft tissues and spinal canal: No prevertebral fluid or swelling. No visible canal hematoma. Disc levels: No significant degenerative change. No central canal stenosis. Upper chest: Unremarkable. Mild emphysematous change at the lung apices. Other: None IMPRESSION: 1. Significantly increased intraparenchymal hemorrhage within the lower left frontal lobe and left temporal lobe, with surrounding edema, now with associated local mass effect. Smaller amount of parenchymal hemorrhage within the anterior aspects of the right temporal lobe. 2. Stable appearance of the subarachnoid hemorrhage within the suprasellar cistern and anterior basal subarachnoid spaces. Now appreciated is an additional small amount of posttraumatic subarachnoid hemorrhage within the sulci of the parietal lobes bilaterally, at the level of the lateral ventricles. 3. Numerous skull base fractures and facial bone fractures without significant change compared to the earlier exam, as described in detail on the earlier exam. 4. Additional minimally displaced fractures now noted within the right occipital condyle. No malalignment at the adjacent atlanto occipital joint space. Underlying C1 vertebral body appears intact and normally aligned. Additional nondisplaced fracture noted at the far lateral aspect of the left occipital condyle. 5. No fracture or acute subluxation identified in the cervical spine. These results were called by telephone at the time of interpretation on 01/05/2016 at 8:13 pm to Dr. Georganna Skeans , who verbally acknowledged these results. Electronically Signed   By: Franki Cabot M.D.   On: 01/05/2016 20:16   Ct Head Wo Contrast  Result Date: 01/05/2016 CLINICAL DATA:  Trip and fall injury down steps. Injuries to the head. Increasingly lethargic since the fall. Alcohol use. EXAM: CT  HEAD WITHOUT CONTRAST CT MAXILLOFACIAL WITHOUT CONTRAST CT CERVICAL SPINE WITHOUT CONTRAST TECHNIQUE: Multidetector CT imaging of the head, cervical spine, and maxillofacial structures were performed using the standard protocol without intravenous contrast. Multiplanar CT image reconstructions of the cervical spine and maxillofacial structures were also generated. COMPARISON:  None. FINDINGS: CT HEAD FINDINGS Brain: Pneumocephalus in the skullbase. Acute subarachnoid hemorrhage filling suprasellar cisterns and anterior basal subarachnoid spaces. Can't exclude small intraparenchymal hematomas around the periphery. No mass effect or midline shift. Gray-white matter junctions are distinct. Basal cisterns are not effaced. Ventricles are not dilated. Vascular: No hyperdense vessel or unexpected calcification. Skull: Mildly depressed basal skull fractures involving the greater wing of the sphenoid bone extending to the anterior temporal bone at the temporomandibular joint. Fractures across the base of the clivus extending from right to left. Non depressed fractures of the left sphenoid bone involving the greater wing, extending to the sphenoid sinus and to the temporomandibular joint. Calvarium appears intact. Sinuses/Orbits: See below. Mastoid air cells are not opacified. External and middle ear cavities are not opacified. Other: Examination is technically limited due to significant motion artifact throughout the examination. CT MAXILLOFACIAL FINDINGS Osseous: Depressed anterior nasal bone fractures. Fractures of the medial orbital walls bilaterally and of the left superior and inferior orbital walls. Fractures of the lateral, anterior, and inferior maxillary antral walls bilaterally. Fractures of the pterygoid plates on the right. Displaced fractures of the posterior nasal septum. Fracture of the right hard palate and maxilla. Mandibles and temporomandibular joints appear intact. Orbits: Globes and extraocular muscles  appear intact and symmetrical. The no evidence of herniation or entrapment of  the extraocular muscles. Bilateral periorbital hematomas and emphysematous changes. Bilateral extraconal retrobulbar emphysema, greater on the left. Sinuses: Opacification of the left frontal, bilateral ethmoid, bilateral sphenoid, and bilateral maxillary sinuses. Soft tissues: Prominent soft tissue swelling and subcutaneous emphysema in the facial regions inferior to the orbits and along the cheeks bilaterally. Soft tissue emphysema demonstrated in the masticator muscle spaces and along the right mandible. Limited intracranial: See above. Other:  Examination is limited due to motion artifact. CT CERVICAL SPINE FINDINGS Alignment: Normal Skull base and vertebrae: Fractures of the skullbase as previously discussed. No vertebral compression deformities. Ununited ossicles are demonstrated at the anterior inferior endplate of C3 and at the anterior superior endplate of C6 which are not definitely corticated and could represent corner fractures. The posterior elements appear intact. C1-2 articulation appears intact. Soft tissues and spinal canal: Gas bubbles demonstrated within the central canal at the level of C2 through C5. This likely represents gas within the CSF space, tracking down from the intracranial contents. No prevertebral soft tissue swelling. No central canal hematoma demonstrated. Disc levels: Mild degenerative changes in the cervical spine with mild endplate hypertrophic changes present. Upper chest: Emphysematous changes in the lung apices. Other: Examination is limited due to motion artifact. IMPRESSION: CT head: Acute intracranial subarachnoid and possibly intraparenchymal hemorrhages along the anterior frontal region. Acute hemorrhage in the suprasellar cisterns. Pneumocephalus. No mass-effect or midline shift. CT maxillary: Fractures as described throughout the skull base involving the sphenoid bones, clivus, and  temporomandibular joints. Fractures of bilateral orbits and maxillary antral walls as described. Fractures also involve the pterygoid plates on the right. Associated soft tissue hematomas and soft tissue emphysema in the orbital and facial regions as described. CT cervical spine: Normal alignment. No acute displaced fractures, but cannot exclude anterior superior endplate corner fracture at C6 and anterior inferior endplate on a fracture at C3. Gas within the spinal canal, probably within the CSF space. These results were called by telephone at the time of interpretation on 01/05/2016 at 3:49 am to Dr. Quintella Reichert , who verbally acknowledged these results. Electronically Signed   By: Lucienne Capers M.D.   On: 01/05/2016 04:09   Ct Cervical Spine Wo Contrast  Result Date: 01/05/2016 CLINICAL DATA:  Fall down stairs last night. Follow-up head bleed from last night. Repeat cervical spine as patient was unable to hold still for spine imaging last night. Patient with posterior neck pain. EXAM: CT HEAD WITHOUT CONTRAST CT CERVICAL SPINE WITHOUT CONTRAST TECHNIQUE: Multidetector CT imaging of the head and cervical spine was performed following the standard protocol without intravenous contrast. Multiplanar CT image reconstructions of the cervical spine were also generated. COMPARISON:  Head CT from earlier same day. FINDINGS: CT HEAD FINDINGS Brain: There is significantly increased intraparenchymal hemorrhage within the lower left frontal lobe and left temporal lobe, with surrounding edema. There is increased local mass effect. Smaller amount of parenchymal hemorrhage noted in the anterior aspect of the right temporal lobe. The subarachnoid hemorrhage appears stable within the suprasellar cistern and anterior basal subarachnoid spaces. Now seen is a small amount of additional subarachnoid hemorrhage within the sulci of the parietal lobes bilaterally, at and just above the level of the lateral ventricles. Ventricles  are stable in size and configuration. Small amount of air now appreciated in the ventricles. There is slightly increased pneumocephalus at the skullbase due to the adjacent skullbase fractures and sinus fractures. Vascular: No hyperdense vessel or unexpected calcification. Skull: Again noted are the slightly displaced/depressed fractures  involving the greater wing of the sphenoid bone extending to the anterior temple bones adjacent to each TMJ. Fracture again noted across the base of the clivus. Again noted are the slightly displaced fractures along the posterior and inferior margin of the sphenoid sinus. Associated fluid again appreciated throughout the paranasal sinuses. There is again no calvarial fracture identified. Sinuses/Orbits: As above. Numerous facial bone fractures, as described in detail on earlier maxillofacial CT report. Other: None CT CERVICAL SPINE FINDINGS Alignment: Alignment of the cervical spine is normal. Skull base and vertebrae: No fracture line or displaced fracture fragment identified within the cervical spine. As described on yesterday's exam, there are multiple skullbase fractures including a slightly displaced fracture through the base of the clivus. Also now appreciated are slightly displaced fractures within the right occipital condyle and a minimally displaced fracture within the far outer aspect of the left occipital condyle. Soft tissues and spinal canal: No prevertebral fluid or swelling. No visible canal hematoma. Disc levels: No significant degenerative change. No central canal stenosis. Upper chest: Unremarkable. Mild emphysematous change at the lung apices. Other: None IMPRESSION: 1. Significantly increased intraparenchymal hemorrhage within the lower left frontal lobe and left temporal lobe, with surrounding edema, now with associated local mass effect. Smaller amount of parenchymal hemorrhage within the anterior aspects of the right temporal lobe. 2. Stable appearance of the  subarachnoid hemorrhage within the suprasellar cistern and anterior basal subarachnoid spaces. Now appreciated is an additional small amount of posttraumatic subarachnoid hemorrhage within the sulci of the parietal lobes bilaterally, at the level of the lateral ventricles. 3. Numerous skull base fractures and facial bone fractures without significant change compared to the earlier exam, as described in detail on the earlier exam. 4. Additional minimally displaced fractures now noted within the right occipital condyle. No malalignment at the adjacent atlanto occipital joint space. Underlying C1 vertebral body appears intact and normally aligned. Additional nondisplaced fracture noted at the far lateral aspect of the left occipital condyle. 5. No fracture or acute subluxation identified in the cervical spine. These results were called by telephone at the time of interpretation on 01/05/2016 at 8:13 pm to Dr. Georganna Skeans , who verbally acknowledged these results. Electronically Signed   By: Franki Cabot M.D.   On: 01/05/2016 20:16   Ct Cervical Spine Wo Contrast  Result Date: 01/05/2016 CLINICAL DATA:  Trip and fall injury down steps. Injuries to the head. Increasingly lethargic since the fall. Alcohol use. EXAM: CT HEAD WITHOUT CONTRAST CT MAXILLOFACIAL WITHOUT CONTRAST CT CERVICAL SPINE WITHOUT CONTRAST TECHNIQUE: Multidetector CT imaging of the head, cervical spine, and maxillofacial structures were performed using the standard protocol without intravenous contrast. Multiplanar CT image reconstructions of the cervical spine and maxillofacial structures were also generated. COMPARISON:  None. FINDINGS: CT HEAD FINDINGS Brain: Pneumocephalus in the skullbase. Acute subarachnoid hemorrhage filling suprasellar cisterns and anterior basal subarachnoid spaces. Can't exclude small intraparenchymal hematomas around the periphery. No mass effect or midline shift. Gray-white matter junctions are distinct. Basal  cisterns are not effaced. Ventricles are not dilated. Vascular: No hyperdense vessel or unexpected calcification. Skull: Mildly depressed basal skull fractures involving the greater wing of the sphenoid bone extending to the anterior temporal bone at the temporomandibular joint. Fractures across the base of the clivus extending from right to left. Non depressed fractures of the left sphenoid bone involving the greater wing, extending to the sphenoid sinus and to the temporomandibular joint. Calvarium appears intact. Sinuses/Orbits: See below. Mastoid air cells are not opacified.  External and middle ear cavities are not opacified. Other: Examination is technically limited due to significant motion artifact throughout the examination. CT MAXILLOFACIAL FINDINGS Osseous: Depressed anterior nasal bone fractures. Fractures of the medial orbital walls bilaterally and of the left superior and inferior orbital walls. Fractures of the lateral, anterior, and inferior maxillary antral walls bilaterally. Fractures of the pterygoid plates on the right. Displaced fractures of the posterior nasal septum. Fracture of the right hard palate and maxilla. Mandibles and temporomandibular joints appear intact. Orbits: Globes and extraocular muscles appear intact and symmetrical. The no evidence of herniation or entrapment of the extraocular muscles. Bilateral periorbital hematomas and emphysematous changes. Bilateral extraconal retrobulbar emphysema, greater on the left. Sinuses: Opacification of the left frontal, bilateral ethmoid, bilateral sphenoid, and bilateral maxillary sinuses. Soft tissues: Prominent soft tissue swelling and subcutaneous emphysema in the facial regions inferior to the orbits and along the cheeks bilaterally. Soft tissue emphysema demonstrated in the masticator muscle spaces and along the right mandible. Limited intracranial: See above. Other:  Examination is limited due to motion artifact. CT CERVICAL SPINE  FINDINGS Alignment: Normal Skull base and vertebrae: Fractures of the skullbase as previously discussed. No vertebral compression deformities. Ununited ossicles are demonstrated at the anterior inferior endplate of C3 and at the anterior superior endplate of C6 which are not definitely corticated and could represent corner fractures. The posterior elements appear intact. C1-2 articulation appears intact. Soft tissues and spinal canal: Gas bubbles demonstrated within the central canal at the level of C2 through C5. This likely represents gas within the CSF space, tracking down from the intracranial contents. No prevertebral soft tissue swelling. No central canal hematoma demonstrated. Disc levels: Mild degenerative changes in the cervical spine with mild endplate hypertrophic changes present. Upper chest: Emphysematous changes in the lung apices. Other: Examination is limited due to motion artifact. IMPRESSION: CT head: Acute intracranial subarachnoid and possibly intraparenchymal hemorrhages along the anterior frontal region. Acute hemorrhage in the suprasellar cisterns. Pneumocephalus. No mass-effect or midline shift. CT maxillary: Fractures as described throughout the skull base involving the sphenoid bones, clivus, and temporomandibular joints. Fractures of bilateral orbits and maxillary antral walls as described. Fractures also involve the pterygoid plates on the right. Associated soft tissue hematomas and soft tissue emphysema in the orbital and facial regions as described. CT cervical spine: Normal alignment. No acute displaced fractures, but cannot exclude anterior superior endplate corner fracture at C6 and anterior inferior endplate on a fracture at C3. Gas within the spinal canal, probably within the CSF space. These results were called by telephone at the time of interpretation on 01/05/2016 at 3:49 am to Dr. Quintella Reichert , who verbally acknowledged these results. Electronically Signed   By: Lucienne Capers M.D.   On: 01/05/2016 04:09   Dg Chest Port 1 View  Result Date: 01/05/2016 CLINICAL DATA:  Altered mental status after fall down stairs. Alcohol use. EXAM: PORTABLE CHEST 1 VIEW COMPARISON:  None. FINDINGS: Shallow inspiration. Borderline heart size with normal pulmonary vascularity, likely normal for technique. No blunting of costophrenic angles. No pneumothorax. No focal airspace disease or consolidation in the lungs. Calcified and tortuous aorta. IMPRESSION: Shallow inspiration.  No evidence of active pulmonary disease. Electronically Signed   By: Lucienne Capers M.D.   On: 01/05/2016 03:10   Ct Maxillofacial Wo Cm  Result Date: 01/05/2016 CLINICAL DATA:  Trip and fall injury down steps. Injuries to the head. Increasingly lethargic since the fall. Alcohol use. EXAM: CT HEAD WITHOUT CONTRAST CT  MAXILLOFACIAL WITHOUT CONTRAST CT CERVICAL SPINE WITHOUT CONTRAST TECHNIQUE: Multidetector CT imaging of the head, cervical spine, and maxillofacial structures were performed using the standard protocol without intravenous contrast. Multiplanar CT image reconstructions of the cervical spine and maxillofacial structures were also generated. COMPARISON:  None. FINDINGS: CT HEAD FINDINGS Brain: Pneumocephalus in the skullbase. Acute subarachnoid hemorrhage filling suprasellar cisterns and anterior basal subarachnoid spaces. Can't exclude small intraparenchymal hematomas around the periphery. No mass effect or midline shift. Gray-white matter junctions are distinct. Basal cisterns are not effaced. Ventricles are not dilated. Vascular: No hyperdense vessel or unexpected calcification. Skull: Mildly depressed basal skull fractures involving the greater wing of the sphenoid bone extending to the anterior temporal bone at the temporomandibular joint. Fractures across the base of the clivus extending from right to left. Non depressed fractures of the left sphenoid bone involving the greater wing, extending to the  sphenoid sinus and to the temporomandibular joint. Calvarium appears intact. Sinuses/Orbits: See below. Mastoid air cells are not opacified. External and middle ear cavities are not opacified. Other: Examination is technically limited due to significant motion artifact throughout the examination. CT MAXILLOFACIAL FINDINGS Osseous: Depressed anterior nasal bone fractures. Fractures of the medial orbital walls bilaterally and of the left superior and inferior orbital walls. Fractures of the lateral, anterior, and inferior maxillary antral walls bilaterally. Fractures of the pterygoid plates on the right. Displaced fractures of the posterior nasal septum. Fracture of the right hard palate and maxilla. Mandibles and temporomandibular joints appear intact. Orbits: Globes and extraocular muscles appear intact and symmetrical. The no evidence of herniation or entrapment of the extraocular muscles. Bilateral periorbital hematomas and emphysematous changes. Bilateral extraconal retrobulbar emphysema, greater on the left. Sinuses: Opacification of the left frontal, bilateral ethmoid, bilateral sphenoid, and bilateral maxillary sinuses. Soft tissues: Prominent soft tissue swelling and subcutaneous emphysema in the facial regions inferior to the orbits and along the cheeks bilaterally. Soft tissue emphysema demonstrated in the masticator muscle spaces and along the right mandible. Limited intracranial: See above. Other:  Examination is limited due to motion artifact. CT CERVICAL SPINE FINDINGS Alignment: Normal Skull base and vertebrae: Fractures of the skullbase as previously discussed. No vertebral compression deformities. Ununited ossicles are demonstrated at the anterior inferior endplate of C3 and at the anterior superior endplate of C6 which are not definitely corticated and could represent corner fractures. The posterior elements appear intact. C1-2 articulation appears intact. Soft tissues and spinal canal: Gas bubbles  demonstrated within the central canal at the level of C2 through C5. This likely represents gas within the CSF space, tracking down from the intracranial contents. No prevertebral soft tissue swelling. No central canal hematoma demonstrated. Disc levels: Mild degenerative changes in the cervical spine with mild endplate hypertrophic changes present. Upper chest: Emphysematous changes in the lung apices. Other: Examination is limited due to motion artifact. IMPRESSION: CT head: Acute intracranial subarachnoid and possibly intraparenchymal hemorrhages along the anterior frontal region. Acute hemorrhage in the suprasellar cisterns. Pneumocephalus. No mass-effect or midline shift. CT maxillary: Fractures as described throughout the skull base involving the sphenoid bones, clivus, and temporomandibular joints. Fractures of bilateral orbits and maxillary antral walls as described. Fractures also involve the pterygoid plates on the right. Associated soft tissue hematomas and soft tissue emphysema in the orbital and facial regions as described. CT cervical spine: Normal alignment. No acute displaced fractures, but cannot exclude anterior superior endplate corner fracture at C6 and anterior inferior endplate on a fracture at C3. Gas within the spinal  canal, probably within the CSF space. These results were called by telephone at the time of interpretation on 01/05/2016 at 3:49 am to Dr. Quintella Reichert , who verbally acknowledged these results. Electronically Signed   By: Lucienne Capers M.D.   On: 01/05/2016 04:09    Assessment/Plan: Traumatic brain injury, basilar skull fractures, cerebral contusions: He is doing very well clinically despite the blossoming of his contusions. I don't see any evidence of CSF leak.  Occipital condyle fracture: This is likely stable but we will plan to keep him in a collar for a week or 2 and then flexion-extension.  LOS: 1 day     Annalucia Laino D 01/06/2016, 7:44 AM

## 2016-01-06 NOTE — Progress Notes (Signed)
Subjective: Patient awake, alert Seems to be oriented x 4 Amnestic for event, but able to remember what he has been told about Friday night. Swallow eval pending Bifrontal contusions enlarged on most recent head CT Objective: Vital signs in last 24 hours: Temp:  [97.5 F (36.4 C)-98.5 F (36.9 C)] 98.3 F (36.8 C) (10/01 0400) Pulse Rate:  [53-77] 53 (10/01 0700) Resp:  [9-31] 11 (10/01 0700) BP: (121-152)/(71-92) 128/82 (10/01 0700) SpO2:  [87 %-100 %] 98 % (10/01 0700) Last BM Date: 01/04/16  Intake/Output from previous day: 09/30 0701 - 10/01 0700 In: 2250 [I.V.:2250] Out: 1275 [Urine:1275] Intake/Output this shift: No intake/output data recorded.  GCS 15 Pupils equal Periorbital edema/ ecchymosis much improved Vision equal in both eyes No CSF rhinorrhea noted Neck - C-collar loose; adjusted Lungs - CTA B CV - RRR Abd - soft, non-tender Lab Results:   Recent Labs  01/05/16 0244 01/06/16 0312  WBC 6.9 12.7*  HGB 14.3 12.7*  HCT 43.6 38.9*  PLT 178 168   BMET  Recent Labs  01/05/16 0244 01/06/16 0312  NA 142 144  K 3.7 4.0  CL 111 108  CO2 23 28  GLUCOSE 162* 143*  BUN 13 15  CREATININE 0.99 1.00  CALCIUM 8.8* 8.7*   PT/INR  Recent Labs  01/06/16 0312  LABPROT 13.8  INR 1.06   ABG No results for input(s): PHART, HCO3 in the last 72 hours.  Invalid input(s): PCO2, PO2  Studies/Results: Ct Head Without Contrast  Result Date: 01/05/2016 CLINICAL DATA:  Fall down stairs last night. Follow-up head bleed from last night. Repeat cervical spine as patient was unable to hold still for spine imaging last night. Patient with posterior neck pain. EXAM: CT HEAD WITHOUT CONTRAST CT CERVICAL SPINE WITHOUT CONTRAST TECHNIQUE: Multidetector CT imaging of the head and cervical spine was performed following the standard protocol without intravenous contrast. Multiplanar CT image reconstructions of the cervical spine were also generated. COMPARISON:  Head  CT from earlier same day. FINDINGS: CT HEAD FINDINGS Brain: There is significantly increased intraparenchymal hemorrhage within the lower left frontal lobe and left temporal lobe, with surrounding edema. There is increased local mass effect. Smaller amount of parenchymal hemorrhage noted in the anterior aspect of the right temporal lobe. The subarachnoid hemorrhage appears stable within the suprasellar cistern and anterior basal subarachnoid spaces. Now seen is a small amount of additional subarachnoid hemorrhage within the sulci of the parietal lobes bilaterally, at and just above the level of the lateral ventricles. Ventricles are stable in size and configuration. Small amount of air now appreciated in the ventricles. There is slightly increased pneumocephalus at the skullbase due to the adjacent skullbase fractures and sinus fractures. Vascular: No hyperdense vessel or unexpected calcification. Skull: Again noted are the slightly displaced/depressed fractures involving the greater wing of the sphenoid bone extending to the anterior temple bones adjacent to each TMJ. Fracture again noted across the base of the clivus. Again noted are the slightly displaced fractures along the posterior and inferior margin of the sphenoid sinus. Associated fluid again appreciated throughout the paranasal sinuses. There is again no calvarial fracture identified. Sinuses/Orbits: As above. Numerous facial bone fractures, as described in detail on earlier maxillofacial CT report. Other: None CT CERVICAL SPINE FINDINGS Alignment: Alignment of the cervical spine is normal. Skull base and vertebrae: No fracture line or displaced fracture fragment identified within the cervical spine. As described on yesterday's exam, there are multiple skullbase fractures including a slightly displaced fracture through  the base of the clivus. Also now appreciated are slightly displaced fractures within the right occipital condyle and a minimally displaced  fracture within the far outer aspect of the left occipital condyle. Soft tissues and spinal canal: No prevertebral fluid or swelling. No visible canal hematoma. Disc levels: No significant degenerative change. No central canal stenosis. Upper chest: Unremarkable. Mild emphysematous change at the lung apices. Other: None IMPRESSION: 1. Significantly increased intraparenchymal hemorrhage within the lower left frontal lobe and left temporal lobe, with surrounding edema, now with associated local mass effect. Smaller amount of parenchymal hemorrhage within the anterior aspects of the right temporal lobe. 2. Stable appearance of the subarachnoid hemorrhage within the suprasellar cistern and anterior basal subarachnoid spaces. Now appreciated is an additional small amount of posttraumatic subarachnoid hemorrhage within the sulci of the parietal lobes bilaterally, at the level of the lateral ventricles. 3. Numerous skull base fractures and facial bone fractures without significant change compared to the earlier exam, as described in detail on the earlier exam. 4. Additional minimally displaced fractures now noted within the right occipital condyle. No malalignment at the adjacent atlanto occipital joint space. Underlying C1 vertebral body appears intact and normally aligned. Additional nondisplaced fracture noted at the far lateral aspect of the left occipital condyle. 5. No fracture or acute subluxation identified in the cervical spine. These results were called by telephone at the time of interpretation on 01/05/2016 at 8:13 pm to Dr. Georganna Skeans , who verbally acknowledged these results. Electronically Signed   By: Franki Cabot M.D.   On: 01/05/2016 20:16   Ct Head Wo Contrast  Result Date: 01/05/2016 CLINICAL DATA:  Trip and fall injury down steps. Injuries to the head. Increasingly lethargic since the fall. Alcohol use. EXAM: CT HEAD WITHOUT CONTRAST CT MAXILLOFACIAL WITHOUT CONTRAST CT CERVICAL SPINE WITHOUT  CONTRAST TECHNIQUE: Multidetector CT imaging of the head, cervical spine, and maxillofacial structures were performed using the standard protocol without intravenous contrast. Multiplanar CT image reconstructions of the cervical spine and maxillofacial structures were also generated. COMPARISON:  None. FINDINGS: CT HEAD FINDINGS Brain: Pneumocephalus in the skullbase. Acute subarachnoid hemorrhage filling suprasellar cisterns and anterior basal subarachnoid spaces. Can't exclude small intraparenchymal hematomas around the periphery. No mass effect or midline shift. Gray-white matter junctions are distinct. Basal cisterns are not effaced. Ventricles are not dilated. Vascular: No hyperdense vessel or unexpected calcification. Skull: Mildly depressed basal skull fractures involving the greater wing of the sphenoid bone extending to the anterior temporal bone at the temporomandibular joint. Fractures across the base of the clivus extending from right to left. Non depressed fractures of the left sphenoid bone involving the greater wing, extending to the sphenoid sinus and to the temporomandibular joint. Calvarium appears intact. Sinuses/Orbits: See below. Mastoid air cells are not opacified. External and middle ear cavities are not opacified. Other: Examination is technically limited due to significant motion artifact throughout the examination. CT MAXILLOFACIAL FINDINGS Osseous: Depressed anterior nasal bone fractures. Fractures of the medial orbital walls bilaterally and of the left superior and inferior orbital walls. Fractures of the lateral, anterior, and inferior maxillary antral walls bilaterally. Fractures of the pterygoid plates on the right. Displaced fractures of the posterior nasal septum. Fracture of the right hard palate and maxilla. Mandibles and temporomandibular joints appear intact. Orbits: Globes and extraocular muscles appear intact and symmetrical. The no evidence of herniation or entrapment of the  extraocular muscles. Bilateral periorbital hematomas and emphysematous changes. Bilateral extraconal retrobulbar emphysema, greater on the left. Sinuses:  Opacification of the left frontal, bilateral ethmoid, bilateral sphenoid, and bilateral maxillary sinuses. Soft tissues: Prominent soft tissue swelling and subcutaneous emphysema in the facial regions inferior to the orbits and along the cheeks bilaterally. Soft tissue emphysema demonstrated in the masticator muscle spaces and along the right mandible. Limited intracranial: See above. Other:  Examination is limited due to motion artifact. CT CERVICAL SPINE FINDINGS Alignment: Normal Skull base and vertebrae: Fractures of the skullbase as previously discussed. No vertebral compression deformities. Ununited ossicles are demonstrated at the anterior inferior endplate of C3 and at the anterior superior endplate of C6 which are not definitely corticated and could represent corner fractures. The posterior elements appear intact. C1-2 articulation appears intact. Soft tissues and spinal canal: Gas bubbles demonstrated within the central canal at the level of C2 through C5. This likely represents gas within the CSF space, tracking down from the intracranial contents. No prevertebral soft tissue swelling. No central canal hematoma demonstrated. Disc levels: Mild degenerative changes in the cervical spine with mild endplate hypertrophic changes present. Upper chest: Emphysematous changes in the lung apices. Other: Examination is limited due to motion artifact. IMPRESSION: CT head: Acute intracranial subarachnoid and possibly intraparenchymal hemorrhages along the anterior frontal region. Acute hemorrhage in the suprasellar cisterns. Pneumocephalus. No mass-effect or midline shift. CT maxillary: Fractures as described throughout the skull base involving the sphenoid bones, clivus, and temporomandibular joints. Fractures of bilateral orbits and maxillary antral walls as  described. Fractures also involve the pterygoid plates on the right. Associated soft tissue hematomas and soft tissue emphysema in the orbital and facial regions as described. CT cervical spine: Normal alignment. No acute displaced fractures, but cannot exclude anterior superior endplate corner fracture at C6 and anterior inferior endplate on a fracture at C3. Gas within the spinal canal, probably within the CSF space. These results were called by telephone at the time of interpretation on 01/05/2016 at 3:49 am to Dr. Quintella Reichert , who verbally acknowledged these results. Electronically Signed   By: Lucienne Capers M.D.   On: 01/05/2016 04:09   Ct Cervical Spine Wo Contrast  Result Date: 01/05/2016 CLINICAL DATA:  Fall down stairs last night. Follow-up head bleed from last night. Repeat cervical spine as patient was unable to hold still for spine imaging last night. Patient with posterior neck pain. EXAM: CT HEAD WITHOUT CONTRAST CT CERVICAL SPINE WITHOUT CONTRAST TECHNIQUE: Multidetector CT imaging of the head and cervical spine was performed following the standard protocol without intravenous contrast. Multiplanar CT image reconstructions of the cervical spine were also generated. COMPARISON:  Head CT from earlier same day. FINDINGS: CT HEAD FINDINGS Brain: There is significantly increased intraparenchymal hemorrhage within the lower left frontal lobe and left temporal lobe, with surrounding edema. There is increased local mass effect. Smaller amount of parenchymal hemorrhage noted in the anterior aspect of the right temporal lobe. The subarachnoid hemorrhage appears stable within the suprasellar cistern and anterior basal subarachnoid spaces. Now seen is a small amount of additional subarachnoid hemorrhage within the sulci of the parietal lobes bilaterally, at and just above the level of the lateral ventricles. Ventricles are stable in size and configuration. Small amount of air now appreciated in the  ventricles. There is slightly increased pneumocephalus at the skullbase due to the adjacent skullbase fractures and sinus fractures. Vascular: No hyperdense vessel or unexpected calcification. Skull: Again noted are the slightly displaced/depressed fractures involving the greater wing of the sphenoid bone extending to the anterior temple bones adjacent to each TMJ.  Fracture again noted across the base of the clivus. Again noted are the slightly displaced fractures along the posterior and inferior margin of the sphenoid sinus. Associated fluid again appreciated throughout the paranasal sinuses. There is again no calvarial fracture identified. Sinuses/Orbits: As above. Numerous facial bone fractures, as described in detail on earlier maxillofacial CT report. Other: None CT CERVICAL SPINE FINDINGS Alignment: Alignment of the cervical spine is normal. Skull base and vertebrae: No fracture line or displaced fracture fragment identified within the cervical spine. As described on yesterday's exam, there are multiple skullbase fractures including a slightly displaced fracture through the base of the clivus. Also now appreciated are slightly displaced fractures within the right occipital condyle and a minimally displaced fracture within the far outer aspect of the left occipital condyle. Soft tissues and spinal canal: No prevertebral fluid or swelling. No visible canal hematoma. Disc levels: No significant degenerative change. No central canal stenosis. Upper chest: Unremarkable. Mild emphysematous change at the lung apices. Other: None IMPRESSION: 1. Significantly increased intraparenchymal hemorrhage within the lower left frontal lobe and left temporal lobe, with surrounding edema, now with associated local mass effect. Smaller amount of parenchymal hemorrhage within the anterior aspects of the right temporal lobe. 2. Stable appearance of the subarachnoid hemorrhage within the suprasellar cistern and anterior basal  subarachnoid spaces. Now appreciated is an additional small amount of posttraumatic subarachnoid hemorrhage within the sulci of the parietal lobes bilaterally, at the level of the lateral ventricles. 3. Numerous skull base fractures and facial bone fractures without significant change compared to the earlier exam, as described in detail on the earlier exam. 4. Additional minimally displaced fractures now noted within the right occipital condyle. No malalignment at the adjacent atlanto occipital joint space. Underlying C1 vertebral body appears intact and normally aligned. Additional nondisplaced fracture noted at the far lateral aspect of the left occipital condyle. 5. No fracture or acute subluxation identified in the cervical spine. These results were called by telephone at the time of interpretation on 01/05/2016 at 8:13 pm to Dr. Georganna Skeans , who verbally acknowledged these results. Electronically Signed   By: Franki Cabot M.D.   On: 01/05/2016 20:16   Ct Cervical Spine Wo Contrast  Result Date: 01/05/2016 CLINICAL DATA:  Trip and fall injury down steps. Injuries to the head. Increasingly lethargic since the fall. Alcohol use. EXAM: CT HEAD WITHOUT CONTRAST CT MAXILLOFACIAL WITHOUT CONTRAST CT CERVICAL SPINE WITHOUT CONTRAST TECHNIQUE: Multidetector CT imaging of the head, cervical spine, and maxillofacial structures were performed using the standard protocol without intravenous contrast. Multiplanar CT image reconstructions of the cervical spine and maxillofacial structures were also generated. COMPARISON:  None. FINDINGS: CT HEAD FINDINGS Brain: Pneumocephalus in the skullbase. Acute subarachnoid hemorrhage filling suprasellar cisterns and anterior basal subarachnoid spaces. Can't exclude small intraparenchymal hematomas around the periphery. No mass effect or midline shift. Gray-white matter junctions are distinct. Basal cisterns are not effaced. Ventricles are not dilated. Vascular: No hyperdense  vessel or unexpected calcification. Skull: Mildly depressed basal skull fractures involving the greater wing of the sphenoid bone extending to the anterior temporal bone at the temporomandibular joint. Fractures across the base of the clivus extending from right to left. Non depressed fractures of the left sphenoid bone involving the greater wing, extending to the sphenoid sinus and to the temporomandibular joint. Calvarium appears intact. Sinuses/Orbits: See below. Mastoid air cells are not opacified. External and middle ear cavities are not opacified. Other: Examination is technically limited due to significant motion artifact  throughout the examination. CT MAXILLOFACIAL FINDINGS Osseous: Depressed anterior nasal bone fractures. Fractures of the medial orbital walls bilaterally and of the left superior and inferior orbital walls. Fractures of the lateral, anterior, and inferior maxillary antral walls bilaterally. Fractures of the pterygoid plates on the right. Displaced fractures of the posterior nasal septum. Fracture of the right hard palate and maxilla. Mandibles and temporomandibular joints appear intact. Orbits: Globes and extraocular muscles appear intact and symmetrical. The no evidence of herniation or entrapment of the extraocular muscles. Bilateral periorbital hematomas and emphysematous changes. Bilateral extraconal retrobulbar emphysema, greater on the left. Sinuses: Opacification of the left frontal, bilateral ethmoid, bilateral sphenoid, and bilateral maxillary sinuses. Soft tissues: Prominent soft tissue swelling and subcutaneous emphysema in the facial regions inferior to the orbits and along the cheeks bilaterally. Soft tissue emphysema demonstrated in the masticator muscle spaces and along the right mandible. Limited intracranial: See above. Other:  Examination is limited due to motion artifact. CT CERVICAL SPINE FINDINGS Alignment: Normal Skull base and vertebrae: Fractures of the skullbase as  previously discussed. No vertebral compression deformities. Ununited ossicles are demonstrated at the anterior inferior endplate of C3 and at the anterior superior endplate of C6 which are not definitely corticated and could represent corner fractures. The posterior elements appear intact. C1-2 articulation appears intact. Soft tissues and spinal canal: Gas bubbles demonstrated within the central canal at the level of C2 through C5. This likely represents gas within the CSF space, tracking down from the intracranial contents. No prevertebral soft tissue swelling. No central canal hematoma demonstrated. Disc levels: Mild degenerative changes in the cervical spine with mild endplate hypertrophic changes present. Upper chest: Emphysematous changes in the lung apices. Other: Examination is limited due to motion artifact. IMPRESSION: CT head: Acute intracranial subarachnoid and possibly intraparenchymal hemorrhages along the anterior frontal region. Acute hemorrhage in the suprasellar cisterns. Pneumocephalus. No mass-effect or midline shift. CT maxillary: Fractures as described throughout the skull base involving the sphenoid bones, clivus, and temporomandibular joints. Fractures of bilateral orbits and maxillary antral walls as described. Fractures also involve the pterygoid plates on the right. Associated soft tissue hematomas and soft tissue emphysema in the orbital and facial regions as described. CT cervical spine: Normal alignment. No acute displaced fractures, but cannot exclude anterior superior endplate corner fracture at C6 and anterior inferior endplate on a fracture at C3. Gas within the spinal canal, probably within the CSF space. These results were called by telephone at the time of interpretation on 01/05/2016 at 3:49 am to Dr. Quintella Reichert , who verbally acknowledged these results. Electronically Signed   By: Lucienne Capers M.D.   On: 01/05/2016 04:09   Dg Chest Port 1 View  Result Date:  01/05/2016 CLINICAL DATA:  Altered mental status after fall down stairs. Alcohol use. EXAM: PORTABLE CHEST 1 VIEW COMPARISON:  None. FINDINGS: Shallow inspiration. Borderline heart size with normal pulmonary vascularity, likely normal for technique. No blunting of costophrenic angles. No pneumothorax. No focal airspace disease or consolidation in the lungs. Calcified and tortuous aorta. IMPRESSION: Shallow inspiration.  No evidence of active pulmonary disease. Electronically Signed   By: Lucienne Capers M.D.   On: 01/05/2016 03:10   Ct Maxillofacial Wo Cm  Result Date: 01/05/2016 CLINICAL DATA:  Trip and fall injury down steps. Injuries to the head. Increasingly lethargic since the fall. Alcohol use. EXAM: CT HEAD WITHOUT CONTRAST CT MAXILLOFACIAL WITHOUT CONTRAST CT CERVICAL SPINE WITHOUT CONTRAST TECHNIQUE: Multidetector CT imaging of the head, cervical spine, and  maxillofacial structures were performed using the standard protocol without intravenous contrast. Multiplanar CT image reconstructions of the cervical spine and maxillofacial structures were also generated. COMPARISON:  None. FINDINGS: CT HEAD FINDINGS Brain: Pneumocephalus in the skullbase. Acute subarachnoid hemorrhage filling suprasellar cisterns and anterior basal subarachnoid spaces. Can't exclude small intraparenchymal hematomas around the periphery. No mass effect or midline shift. Gray-white matter junctions are distinct. Basal cisterns are not effaced. Ventricles are not dilated. Vascular: No hyperdense vessel or unexpected calcification. Skull: Mildly depressed basal skull fractures involving the greater wing of the sphenoid bone extending to the anterior temporal bone at the temporomandibular joint. Fractures across the base of the clivus extending from right to left. Non depressed fractures of the left sphenoid bone involving the greater wing, extending to the sphenoid sinus and to the temporomandibular joint. Calvarium appears intact.  Sinuses/Orbits: See below. Mastoid air cells are not opacified. External and middle ear cavities are not opacified. Other: Examination is technically limited due to significant motion artifact throughout the examination. CT MAXILLOFACIAL FINDINGS Osseous: Depressed anterior nasal bone fractures. Fractures of the medial orbital walls bilaterally and of the left superior and inferior orbital walls. Fractures of the lateral, anterior, and inferior maxillary antral walls bilaterally. Fractures of the pterygoid plates on the right. Displaced fractures of the posterior nasal septum. Fracture of the right hard palate and maxilla. Mandibles and temporomandibular joints appear intact. Orbits: Globes and extraocular muscles appear intact and symmetrical. The no evidence of herniation or entrapment of the extraocular muscles. Bilateral periorbital hematomas and emphysematous changes. Bilateral extraconal retrobulbar emphysema, greater on the left. Sinuses: Opacification of the left frontal, bilateral ethmoid, bilateral sphenoid, and bilateral maxillary sinuses. Soft tissues: Prominent soft tissue swelling and subcutaneous emphysema in the facial regions inferior to the orbits and along the cheeks bilaterally. Soft tissue emphysema demonstrated in the masticator muscle spaces and along the right mandible. Limited intracranial: See above. Other:  Examination is limited due to motion artifact. CT CERVICAL SPINE FINDINGS Alignment: Normal Skull base and vertebrae: Fractures of the skullbase as previously discussed. No vertebral compression deformities. Ununited ossicles are demonstrated at the anterior inferior endplate of C3 and at the anterior superior endplate of C6 which are not definitely corticated and could represent corner fractures. The posterior elements appear intact. C1-2 articulation appears intact. Soft tissues and spinal canal: Gas bubbles demonstrated within the central canal at the level of C2 through C5. This  likely represents gas within the CSF space, tracking down from the intracranial contents. No prevertebral soft tissue swelling. No central canal hematoma demonstrated. Disc levels: Mild degenerative changes in the cervical spine with mild endplate hypertrophic changes present. Upper chest: Emphysematous changes in the lung apices. Other: Examination is limited due to motion artifact. IMPRESSION: CT head: Acute intracranial subarachnoid and possibly intraparenchymal hemorrhages along the anterior frontal region. Acute hemorrhage in the suprasellar cisterns. Pneumocephalus. No mass-effect or midline shift. CT maxillary: Fractures as described throughout the skull base involving the sphenoid bones, clivus, and temporomandibular joints. Fractures of bilateral orbits and maxillary antral walls as described. Fractures also involve the pterygoid plates on the right. Associated soft tissue hematomas and soft tissue emphysema in the orbital and facial regions as described. CT cervical spine: Normal alignment. No acute displaced fractures, but cannot exclude anterior superior endplate corner fracture at C6 and anterior inferior endplate on a fracture at C3. Gas within the spinal canal, probably within the CSF space. These results were called by telephone at the time of interpretation on  01/05/2016 at 3:49 am to Dr. Quintella Reichert , who verbally acknowledged these results. Electronically Signed   By: Lucienne Capers M.D.   On: 01/05/2016 04:09    Anti-infectives: Anti-infectives    None      Assessment/Plan: Fall down stairs - intoxicated 1.  Acute subarachnoid hemorrhage/ intraparenchymal hemorrhages 2.  Pneumocephalus 3.  Basilar skull fractures involving sphenoid, clivus, and TMJ - associated CSF rhinorrhea 4.  Bilateral orbital fractures 5.  Right cervical occipital condyle fracture - c-collar  Swallow eval today - will start liquids if cleared; do not advance May need surgery on facial fxs in next few  days per Dr. Marla Roe Flex/ ext in 1-2 weeks per Dr. Arnoldo Morale   LOS: 1 day    Chyanne Kohut K. 01/06/2016

## 2016-01-07 ENCOUNTER — Encounter (HOSPITAL_COMMUNITY): Payer: Self-pay | Admitting: Plastic Surgery

## 2016-01-07 ENCOUNTER — Encounter (HOSPITAL_COMMUNITY): Admission: EM | Disposition: A | Discharge status: Rehabilitation Facility | Payer: Self-pay | Source: Home / Self Care

## 2016-01-07 ENCOUNTER — Inpatient Hospital Stay (HOSPITAL_COMMUNITY): Payer: Managed Care, Other (non HMO) | Admitting: Anesthesiology

## 2016-01-07 HISTORY — PX: ORIF MANDIBULAR FRACTURE: SHX2127

## 2016-01-07 LAB — CBC
HCT: 38.2 % — ABNORMAL LOW (ref 39.0–52.0)
Hemoglobin: 12.1 g/dL — ABNORMAL LOW (ref 13.0–17.0)
MCH: 29.3 pg (ref 26.0–34.0)
MCHC: 31.7 g/dL (ref 30.0–36.0)
MCV: 92.5 fL (ref 78.0–100.0)
PLATELETS: 159 10*3/uL (ref 150–400)
RBC: 4.13 MIL/uL — AB (ref 4.22–5.81)
RDW: 13.3 % (ref 11.5–15.5)
WBC: 15.3 10*3/uL — ABNORMAL HIGH (ref 4.0–10.5)

## 2016-01-07 SURGERY — OPEN REDUCTION INTERNAL FIXATION (ORIF) MANDIBULAR FRACTURE
Anesthesia: General | Site: Mouth

## 2016-01-07 MED ORDER — FENTANYL CITRATE (PF) 100 MCG/2ML IJ SOLN
INTRAMUSCULAR | Status: DC | PRN
  Administered 2016-01-07: 100 ug via INTRAVENOUS

## 2016-01-07 MED ORDER — LIDOCAINE 2% (20 MG/ML) 5 ML SYRINGE
INTRAMUSCULAR | Status: AC
  Filled 2016-01-07: qty 5

## 2016-01-07 MED ORDER — SUCCINYLCHOLINE CHLORIDE 200 MG/10ML IV SOSY
PREFILLED_SYRINGE | INTRAVENOUS | Status: AC
  Filled 2016-01-07: qty 10

## 2016-01-07 MED ORDER — DEXAMETHASONE SODIUM PHOSPHATE 10 MG/ML IJ SOLN
INTRAMUSCULAR | Status: DC | PRN
  Administered 2016-01-07: 10 mg via INTRAVENOUS

## 2016-01-07 MED ORDER — PROPOFOL 10 MG/ML IV BOLUS
INTRAVENOUS | Status: DC | PRN
  Administered 2016-01-07: 140 mg via INTRAVENOUS

## 2016-01-07 MED ORDER — LACTATED RINGERS IV SOLN
INTRAVENOUS | Status: DC
  Administered 2016-01-07 (×2): via INTRAVENOUS

## 2016-01-07 MED ORDER — 0.9 % SODIUM CHLORIDE (POUR BTL) OPTIME
TOPICAL | Status: DC | PRN
  Administered 2016-01-07: 1000 mL

## 2016-01-07 MED ORDER — FENTANYL CITRATE (PF) 100 MCG/2ML IJ SOLN
25.0000 ug | INTRAMUSCULAR | Status: DC | PRN
Start: 2016-01-07 — End: 2016-01-07
  Administered 2016-01-07 (×2): 50 ug via INTRAVENOUS

## 2016-01-07 MED ORDER — ONDANSETRON HCL 4 MG/2ML IJ SOLN
INTRAMUSCULAR | Status: DC | PRN
  Administered 2016-01-07: 4 mg via INTRAVENOUS

## 2016-01-07 MED ORDER — LORAZEPAM 2 MG/ML IJ SOLN: 1.0000 mg | Freq: Four times a day (QID) | INTRAMUSCULAR | Status: DC | PRN

## 2016-01-07 MED ORDER — METOPROLOL TARTRATE 5 MG/5ML IV SOLN
5.0000 mg | Freq: Four times a day (QID) | INTRAVENOUS | Status: DC
  Administered 2016-01-07 – 2016-01-10 (×7): 5 mg via INTRAVENOUS
  Filled 2016-01-07 (×8): qty 5

## 2016-01-07 MED ORDER — OXYMETAZOLINE HCL 0.05 % NA SOLN
NASAL | Status: AC
  Filled 2016-01-07: qty 15

## 2016-01-07 MED ORDER — ONDANSETRON HCL 4 MG/2ML IJ SOLN
INTRAMUSCULAR | Status: AC
  Filled 2016-01-07: qty 2

## 2016-01-07 MED ORDER — FENTANYL CITRATE (PF) 100 MCG/2ML IJ SOLN
INTRAMUSCULAR | Status: AC
  Filled 2016-01-07: qty 4

## 2016-01-07 MED ORDER — MIDAZOLAM HCL 2 MG/2ML IJ SOLN
INTRAMUSCULAR | Status: AC
  Filled 2016-01-07: qty 2

## 2016-01-07 MED ORDER — DEXAMETHASONE SODIUM PHOSPHATE 10 MG/ML IJ SOLN
INTRAMUSCULAR | Status: AC
  Filled 2016-01-07: qty 1

## 2016-01-07 MED ORDER — TRAMADOL 5 MG/ML ORAL SUSPENSION
50.0000 mg | Freq: Four times a day (QID) | ORAL | Status: DC
  Administered 2016-01-07 – 2016-01-08 (×2): 50 mg via ORAL
  Filled 2016-01-07 (×2): qty 2

## 2016-01-07 MED ORDER — FOLIC ACID 1 MG PO TABS: 1.0000 mg | ORAL_TABLET | Freq: Every day | ORAL | Status: DC

## 2016-01-07 MED ORDER — SUCCINYLCHOLINE CHLORIDE 200 MG/10ML IV SOSY
PREFILLED_SYRINGE | INTRAVENOUS | Status: DC | PRN
  Administered 2016-01-07: 60 mg via INTRAVENOUS

## 2016-01-07 MED ORDER — LORAZEPAM 1 MG PO TABS: 1.0000 mg | ORAL_TABLET | Freq: Four times a day (QID) | ORAL | Status: DC | PRN

## 2016-01-07 MED ORDER — THIAMINE HCL 100 MG/ML IJ SOLN
100.0000 mg | Freq: Every day | INTRAMUSCULAR | Status: DC
Start: 2016-01-07 — End: 2016-01-08

## 2016-01-07 MED ORDER — HYDRALAZINE HCL 20 MG/ML IJ SOLN
10.0000 mg | Freq: Four times a day (QID) | INTRAMUSCULAR | Status: DC | PRN
  Administered 2016-01-07 – 2016-01-09 (×4): 10 mg via INTRAVENOUS
  Filled 2016-01-07 (×4): qty 1

## 2016-01-07 MED ORDER — HYDROCODONE-ACETAMINOPHEN 7.5-325 MG/15ML PO SOLN
15.0000 mL | ORAL | Status: DC | PRN
  Administered 2016-01-07 – 2016-01-09 (×10): 15 mL via ORAL
  Filled 2016-01-07 (×11): qty 15

## 2016-01-07 MED ORDER — FENTANYL CITRATE (PF) 100 MCG/2ML IJ SOLN
INTRAMUSCULAR | Status: AC
  Filled 2016-01-07: qty 2

## 2016-01-07 MED ORDER — ADULT MULTIVITAMIN W/MINERALS CH: 1.0000 | ORAL_TABLET | Freq: Every day | ORAL | Status: DC

## 2016-01-07 MED ORDER — ACETAMINOPHEN 10 MG/ML IV SOLN
1000.0000 mg | Freq: Once | INTRAVENOUS | Status: AC
  Administered 2016-01-07: 1000 mg via INTRAVENOUS
  Filled 2016-01-07: qty 100

## 2016-01-07 MED ORDER — LIDOCAINE-EPINEPHRINE 1 %-1:100000 IJ SOLN
INTRAMUSCULAR | Status: AC
  Filled 2016-01-07: qty 1

## 2016-01-07 MED ORDER — VITAMIN B-1 100 MG PO TABS: 100.0000 mg | ORAL_TABLET | Freq: Every day | ORAL | Status: DC

## 2016-01-07 SURGICAL SUPPLY — 44 items
BAND DENTAL 3/16IN PULL HEAVY (MISCELLANEOUS) ×2 IMPLANT
BLADE 10 SAFETY STRL DISP (BLADE) ×2 IMPLANT
BLADE SURG 15 STRL LF DISP TIS (BLADE) ×1 IMPLANT
BLADE SURG 15 STRL SS (BLADE) ×1
BLADE SURG ROTATE 9660 (MISCELLANEOUS) IMPLANT
CANISTER SUCTION 2500CC (MISCELLANEOUS) ×2 IMPLANT
CLEANER TIP ELECTROSURG 2X2 (MISCELLANEOUS) ×2 IMPLANT
COVER SURGICAL LIGHT HANDLE (MISCELLANEOUS) ×2 IMPLANT
CRADLE DONUT ADULT HEAD (MISCELLANEOUS) ×2 IMPLANT
ELECT COATED BLADE 2.86 ST (ELECTRODE) ×2 IMPLANT
ELECT REM PT RETURN 9FT ADLT (ELECTROSURGICAL) ×2
ELECTRODE REM PT RTRN 9FT ADLT (ELECTROSURGICAL) ×1 IMPLANT
GLOVE BIO SURGEON STRL SZ 6.5 (GLOVE) ×4 IMPLANT
GLOVE BIOGEL PI IND STRL 6.5 (GLOVE) ×1 IMPLANT
GLOVE BIOGEL PI INDICATOR 6.5 (GLOVE) ×1
GLOVE SURG SS PI 7.0 STRL IVOR (GLOVE) ×2 IMPLANT
GOWN STRL REUS W/ TWL LRG LVL3 (GOWN DISPOSABLE) ×2 IMPLANT
GOWN STRL REUS W/TWL LRG LVL3 (GOWN DISPOSABLE) ×2
KIT BASIN OR (CUSTOM PROCEDURE TRAY) ×2 IMPLANT
KIT ROOM TURNOVER OR (KITS) ×2 IMPLANT
NEEDLE 27GAX1X1/2 (NEEDLE) ×2 IMPLANT
NS IRRIG 1000ML POUR BTL (IV SOLUTION) ×2 IMPLANT
PAD ARMBOARD 7.5X6 YLW CONV (MISCELLANEOUS) ×4 IMPLANT
PENCIL BUTTON HOLSTER BLD 10FT (ELECTRODE) ×2 IMPLANT
PLATE HYBRID MMF (Plate) ×2 IMPLANT
PROTECTOR CORNEAL (OPHTHALMIC RELATED) IMPLANT
RUBBERBAND STERILE (MISCELLANEOUS) ×2 IMPLANT
SCISSORS WIRE ANG 4 3/4 DISP (INSTRUMENTS) ×2 IMPLANT
SCREW LOCK SELFDRIL 2.0X8M MMF (Screw) ×14 IMPLANT
SCREW LOCKING SELF DRILL 2.0X6 (Screw) ×6 IMPLANT
SPLINT NASAL DOYLE BI-VL (GAUZE/BANDAGES/DRESSINGS) IMPLANT
SUT MON AB 3-0 SH 27 (SUTURE) ×2
SUT MON AB 3-0 SH27 (SUTURE) ×2 IMPLANT
SUT PROLENE 6 0 PC 1 (SUTURE) IMPLANT
SUT STEEL 0 (SUTURE)
SUT STEEL 0 18XMFL TIE 17 (SUTURE) IMPLANT
SUT STEEL 1 (SUTURE) IMPLANT
SUT STEEL 2 (SUTURE) IMPLANT
SUT STEEL 4 (SUTURE) ×2 IMPLANT
SUT VICRYL 4-0 PS2 18IN ABS (SUTURE) IMPLANT
TOWEL OR 17X24 6PK STRL BLUE (TOWEL DISPOSABLE) ×2 IMPLANT
TOWEL OR 17X26 10 PK STRL BLUE (TOWEL DISPOSABLE) ×2 IMPLANT
TRAY ENT MC OR (CUSTOM PROCEDURE TRAY) ×2 IMPLANT
YANKAUER SUCT BULB TIP NO VENT (SUCTIONS) ×2 IMPLANT

## 2016-01-07 NOTE — Progress Notes (Signed)
  Subjective: HA, morphine not relieving  Objective: Vital signs in last 24 hours: Temp:  [97.7 F (36.5 C)-98.9 F (37.2 C)] 98.4 F (36.9 C) (10/02 0800) Pulse Rate:  [52-95] 61 (10/02 0800) Resp:  [10-41] 12 (10/02 0800) BP: (122-164)/(59-105) 164/86 (10/02 0800) SpO2:  [89 %-99 %] 98 % (10/02 0800) Last BM Date: 01/04/16  Intake/Output from previous day: 10/01 0701 - 10/02 0700 In: 3120 [P.O.:720; I.V.:2400] Out: 1800 [Urine:1800] Intake/Output this shift: Total I/O In: 100 [I.V.:100] Out: -   General appearance: cooperative Head: evolving facial ecchymoses Neck: collar Resp: clear to auscultation bilaterally Cardio: S1, S2 normal GI: soft, NT, ND Neuor: MAE, F/C, PERL, oriented  Lab Results: CBC   Recent Labs  01/06/16 0312 01/07/16 0327  WBC 12.7* 15.3*  HGB 12.7* 12.1*  HCT 38.9* 38.2*  PLT 168 159   BMET  Recent Labs  01/05/16 0244 01/06/16 0312  NA 142 144  K 3.7 4.0  CL 111 108  CO2 23 28  GLUCOSE 162* 143*  BUN 13 15  CREATININE 0.99 1.00  CALCIUM 8.8* 8.7*   PT/INR  Recent Labs  01/06/16 0312  LABPROT 13.8  INR 1.06   Anti-infectives: Anti-infectives    Start     Dose/Rate Route Frequency Ordered Stop   01/07/16 0600  ceFAZolin (ANCEF) IVPB 2g/100 mL premix     2 g 200 mL/hr over 30 Minutes Intravenous On call to O.R. 01/06/16 1947 01/08/16 0559      Assessment/Plan: Fall TBI/B frontal ICC/basilar skull FXs including clivus, sphenoid, R pterygoid - per Dr. Arnoldo Morale, exam improved. No CSF leak this AM. B max sinus/palate/B TMJ/B orbit FXs - to OR this AM for ORIF by Dr. Marla Roe ETOH abuse - CIWA Occipital condyle FX - collar for 2 weeks then flex ex per Dr. Arnoldo Morale FEN - NPO for OR. Tylenol IV X 1. Adjust oral meds post-op Dispo - ICU   LOS: 2 days    Georganna Skeans, MD, MPH, FACS Trauma: [phone] General Surgery: [phone]  10/2/2017Patient ID: Loma Sousa, male   DOB: 29-Jun-1955, 60 y.o.   MRN:  [id number]

## 2016-01-07 NOTE — Brief Op Note (Signed)
01/05/2016 - 01/07/2016  11:50 AM  PATIENT:  Patrick [name]  60 y.o. male  PRE-OPERATIVE DIAGNOSIS:  MANDIBLE FRACTURE  POST-OPERATIVE DIAGNOSIS:  MANDIBLE FRACTURE  PROCEDURE:  Procedure(s): OPEN REDUCTION INTERNAL FIXATION (ORIF) MANDIBULAR FRACTURE (N/A)  SURGEON:  Surgeon(s) and Role:    * Claire S Dillingham, DO - Primary  PHYSICIAN ASSISTANT: none  ASSISTANTS: none   ANESTHESIA:   general  EBL:  Total I/O In: 300 [I.V.:200; IV Piggyback:100] Out: 650 [Urine:650]  BLOOD ADMINISTERED:none  DRAINS: none   LOCAL MEDICATIONS USED:  LIDOCAINE   SPECIMEN:  No Specimen  DISPOSITION OF SPECIMEN:  N/A  COUNTS:  YES  TOURNIQUET:  * No tourniquets in log *  DICTATION: .Dragon Dictation  PLAN OF CARE: Admit to inpatient   PATIENT DISPOSITION:  PACU - hemodynamically stable.   Delay start of Pharmacological VTE agent (>24hrs) due to surgical blood loss or risk of bleeding: no

## 2016-01-07 NOTE — Op Note (Signed)
Operative Note   DATE OF OPERATION: 01/07/2016  LOCATION: [facility]  SURGICAL DIVISION: Plastic Surgery  PREOPERATIVE DIAGNOSES:  Facial fractures including maxilla, temporomandibular joint and palate  POSTOPERATIVE DIAGNOSES:  same  PROCEDURE:  Maxillomandibular fixation  SURGEON: Claire Sanger Dillingham, DO  ANESTHESIA:  General.   COMPLICATIONS: None.   INDICATIONS FOR PROCEDURE:  The patient, Patrick [name] is a 60 y.o. male born on 1955-05-12, is here for treatment of multiple facial fractures including Depressed anterior nasal bone fractures. Fractures of the lateral, anterior, and inferior maxillary antral walls bilaterally. Fractures of the pterygoid plates on the right.  Fracture of the right hard palate and maxilla. MRN: [id number]  CONSENT:  Informed consent was obtained directly from the patient. Risks, benefits and alternatives were fully discussed. Specific risks including but not limited to bleeding, infection, hematoma, seroma, scarring, pain, infection, contracture, asymmetry, wound healing problems, and need for further surgery were all discussed. The patient did have an ample opportunity to have questions answered to satisfaction.   DESCRIPTION OF PROCEDURE:  The patient was taken to the operating room. SCDs were placed and IV antibiotics were given. The patient's operative site was prepped and draped in a sterile fashion. A time out was performed and all information was confirmed to be correct.  General anesthesia was administered.  The arch bars were placed on the top and bottom with the screws placed to avoid any of the roots of the teeth.  The ET tube was removed and the patient placed in maxillomandibular fixation.  He had good occlusion.  The patient tolerated the procedure well.  There were no complications. The patient was allowed to wake from anesthesia, extubated and taken to the recovery room in satisfactory condition.

## 2016-01-07 NOTE — Interval H&P Note (Signed)
History and Physical Interval Note:  01/07/2016 10:43 AM  Patrick [name]  has presented today for surgery, with the diagnosis of MANDIBLE FRACTURE  The various methods of treatment have been discussed with the patient and family. After consideration of risks, benefits and other options for treatment, the patient has consented to  Procedure(s): OPEN REDUCTION INTERNAL FIXATION (ORIF) MANDIBULAR FRACTURE (N/A) as a surgical intervention .  The patient's history has been reviewed, patient examined, no change in status, stable for surgery.  I have reviewed the patient's chart and labs.  Questions were answered to the patient's satisfaction.     Wallace Going

## 2016-01-07 NOTE — Progress Notes (Signed)
OT Cancellation Note  Patient Details Name: Patrick [name] MRN: [id number] DOB: 1955-07-17   Cancelled Treatment:    Reason Eval/Treat Not Completed: Patient at procedure or test/ unavailable Pt currently in OR for mandibular fx ORIF. OT to resume OT evaluation at the next most appropriate date.  Parke Poisson B 01/07/2016, 11:15 AM   Jeri Modena   OTR/L Pager: [phone] Office: [phone] .

## 2016-01-07 NOTE — Interval H&P Note (Signed)
History and Physical Interval Note:  01/07/2016 7:40 AM  Patrick [name]  has presented today for surgery, with the diagnosis of MANDIBLE FRACTURE  The various methods of treatment have been discussed with the patient and family. After consideration of risks, benefits and other options for treatment, the patient has consented to  Procedure(s): OPEN REDUCTION INTERNAL FIXATION (ORIF) MANDIBULAR FRACTURE (N/A) as a surgical intervention .  The patient's history has been reviewed, patient examined, no change in status, stable for surgery.  I have reviewed the patient's chart and labs.  Questions were answered to the patient's satisfaction.     Wallace Going

## 2016-01-07 NOTE — Progress Notes (Signed)
SLP Cancellation Note  Patient Details Name: Patrick [name] MRN: [id number] DOB: 1955/09/10   Cancelled treatment:         Pt in OR for temporomandibular joint and palate. Will follow up for cognitive assessment as able.    Houston Siren 01/07/2016, 1:55 PM  Orbie Pyo Colvin Caroli.[employer] [phone]

## 2016-01-07 NOTE — Anesthesia Preprocedure Evaluation (Signed)
Anesthesia Evaluation  Patient identified by MRN, date of birth, ID band Patient awake    Reviewed: Allergy & Precautions, NPO status , Patient's Chart, lab work & pertinent test results  History of Anesthesia Complications Negative for: history of anesthetic complications  Airway Mallampati: IV  TM Distance: >3 FB Neck ROM: Limited  Mouth opening: Limited Mouth Opening  Dental  (+) Teeth Intact   Pulmonary neg pulmonary ROS,    breath sounds clear to auscultation       Cardiovascular + angina + CAD   Rhythm:Regular     Neuro/Psych TIAnegative psych ROS   GI/Hepatic negative GI ROS, (+)     substance abuse  alcohol use,   Endo/Other    Renal/GU negative Renal ROS     Musculoskeletal   Abdominal   Peds  Hematology  (+) anemia ,   Anesthesia Other Findings   Reproductive/Obstetrics                             Anesthesia Physical Anesthesia Plan  ASA: III  Anesthesia Plan: General   Post-op Pain Management:    Induction: Intravenous  Airway Management Planned: Oral ETT  Additional Equipment: None  Intra-op Plan:   Post-operative Plan: Extubation in OR  Informed Consent: I have reviewed the patients History and Physical, chart, labs and discussed the procedure including the risks, benefits and alternatives for the proposed anesthesia with the patient or authorized representative who has indicated his/her understanding and acceptance.   Dental advisory given  Plan Discussed with: CRNA and Surgeon  Anesthesia Plan Comments:         Anesthesia Quick Evaluation

## 2016-01-07 NOTE — Progress Notes (Signed)
Patient ID: Patrick [name], male   DOB: [birth year], 60 y.o.   MRN: [id number] Subjective:  The patient is alert and pleasant. He is in no apparent distress. His wife is at the bedside.  Objective: Vital signs in last 24 hours: Temp:  [97.7 F (36.5 C)-98.9 F (37.2 C)] 98.4 F (36.9 C) (10/02 0800) Pulse Rate:  [52-95] 61 (10/02 0800) Resp:  [10-41] 12 (10/02 0800) BP: (122-164)/(59-105) 164/86 (10/02 0800) SpO2:  [89 %-99 %] 98 % (10/02 0800)  Intake/Output from previous day: 10/01 0701 - 10/02 0700 In: 3120 [P.O.:720; I.V.:2400] Out: 1800 [Urine:1800] Intake/Output this shift: Total I/O In: 100 [I.V.:100] Out: -   Physical exam patient is alert and oriented. He is moving all 4 extremities well. He is in no apparent distress. I don't see any CSF otorrhea or rhinorrhea.  Lab Results:  Recent Labs  01/06/16 0312 01/07/16 0327  WBC 12.7* 15.3*  HGB 12.7* 12.1*  HCT 38.9* 38.2*  PLT 168 159   BMET  Recent Labs  01/05/16 0244 01/06/16 0312  NA 142 144  K 3.7 4.0  CL 111 108  CO2 23 28  GLUCOSE 162* 143*  BUN 13 15  CREATININE 0.99 1.00  CALCIUM 8.8* 8.7*    Studies/Results: Ct Head Without Contrast  Result Date: 01/05/2016 CLINICAL DATA:  Fall down stairs last night. Follow-up head bleed from last night. Repeat cervical spine as patient was unable to hold still for spine imaging last night. Patient with posterior neck pain. EXAM: CT HEAD WITHOUT CONTRAST CT CERVICAL SPINE WITHOUT CONTRAST TECHNIQUE: Multidetector CT imaging of the head and cervical spine was performed following the standard protocol without intravenous contrast. Multiplanar CT image reconstructions of the cervical spine were also generated. COMPARISON:  Head CT from earlier same day. FINDINGS: CT HEAD FINDINGS Brain: There is significantly increased intraparenchymal hemorrhage within the lower left frontal lobe and left temporal lobe, with surrounding edema. There is increased local mass effect.  Smaller amount of parenchymal hemorrhage noted in the anterior aspect of the right temporal lobe. The subarachnoid hemorrhage appears stable within the suprasellar cistern and anterior basal subarachnoid spaces. Now seen is a small amount of additional subarachnoid hemorrhage within the sulci of the parietal lobes bilaterally, at and just above the level of the lateral ventricles. Ventricles are stable in size and configuration. Small amount of air now appreciated in the ventricles. There is slightly increased pneumocephalus at the skullbase due to the adjacent skullbase fractures and sinus fractures. Vascular: No hyperdense vessel or unexpected calcification. Skull: Again noted are the slightly displaced/depressed fractures involving the greater wing of the sphenoid bone extending to the anterior temple bones adjacent to each TMJ. Fracture again noted across the base of the clivus. Again noted are the slightly displaced fractures along the posterior and inferior margin of the sphenoid sinus. Associated fluid again appreciated throughout the paranasal sinuses. There is again no calvarial fracture identified. Sinuses/Orbits: As above. Numerous facial bone fractures, as described in detail on earlier maxillofacial CT report. Other: None CT CERVICAL SPINE FINDINGS Alignment: Alignment of the cervical spine is normal. Skull base and vertebrae: No fracture line or displaced fracture fragment identified within the cervical spine. As described on yesterday's exam, there are multiple skullbase fractures including a slightly displaced fracture through the base of the clivus. Also now appreciated are slightly displaced fractures within the right occipital condyle and a minimally displaced fracture within the far outer aspect of the left occipital condyle. Soft tissues and spinal  canal: No prevertebral fluid or swelling. No visible canal hematoma. Disc levels: No significant degenerative change. No central canal stenosis.  Upper chest: Unremarkable. Mild emphysematous change at the lung apices. Other: None IMPRESSION: 1. Significantly increased intraparenchymal hemorrhage within the lower left frontal lobe and left temporal lobe, with surrounding edema, now with associated local mass effect. Smaller amount of parenchymal hemorrhage within the anterior aspects of the right temporal lobe. 2. Stable appearance of the subarachnoid hemorrhage within the suprasellar cistern and anterior basal subarachnoid spaces. Now appreciated is an additional small amount of posttraumatic subarachnoid hemorrhage within the sulci of the parietal lobes bilaterally, at the level of the lateral ventricles. 3. Numerous skull base fractures and facial bone fractures without significant change compared to the earlier exam, as described in detail on the earlier exam. 4. Additional minimally displaced fractures now noted within the right occipital condyle. No malalignment at the adjacent atlanto occipital joint space. Underlying C1 vertebral body appears intact and normally aligned. Additional nondisplaced fracture noted at the far lateral aspect of the left occipital condyle. 5. No fracture or acute subluxation identified in the cervical spine. These results were called by telephone at the time of interpretation on 01/05/2016 at 8:13 pm to Dr. Georganna Skeans , who verbally acknowledged these results. Electronically Signed   By: Franki Cabot M.D.   On: 01/05/2016 20:16   Ct Cervical Spine Wo Contrast  Result Date: 01/05/2016 CLINICAL DATA:  Fall down stairs last night. Follow-up head bleed from last night. Repeat cervical spine as patient was unable to hold still for spine imaging last night. Patient with posterior neck pain. EXAM: CT HEAD WITHOUT CONTRAST CT CERVICAL SPINE WITHOUT CONTRAST TECHNIQUE: Multidetector CT imaging of the head and cervical spine was performed following the standard protocol without intravenous contrast. Multiplanar CT image  reconstructions of the cervical spine were also generated. COMPARISON:  Head CT from earlier same day. FINDINGS: CT HEAD FINDINGS Brain: There is significantly increased intraparenchymal hemorrhage within the lower left frontal lobe and left temporal lobe, with surrounding edema. There is increased local mass effect. Smaller amount of parenchymal hemorrhage noted in the anterior aspect of the right temporal lobe. The subarachnoid hemorrhage appears stable within the suprasellar cistern and anterior basal subarachnoid spaces. Now seen is a small amount of additional subarachnoid hemorrhage within the sulci of the parietal lobes bilaterally, at and just above the level of the lateral ventricles. Ventricles are stable in size and configuration. Small amount of air now appreciated in the ventricles. There is slightly increased pneumocephalus at the skullbase due to the adjacent skullbase fractures and sinus fractures. Vascular: No hyperdense vessel or unexpected calcification. Skull: Again noted are the slightly displaced/depressed fractures involving the greater wing of the sphenoid bone extending to the anterior temple bones adjacent to each TMJ. Fracture again noted across the base of the clivus. Again noted are the slightly displaced fractures along the posterior and inferior margin of the sphenoid sinus. Associated fluid again appreciated throughout the paranasal sinuses. There is again no calvarial fracture identified. Sinuses/Orbits: As above. Numerous facial bone fractures, as described in detail on earlier maxillofacial CT report. Other: None CT CERVICAL SPINE FINDINGS Alignment: Alignment of the cervical spine is normal. Skull base and vertebrae: No fracture line or displaced fracture fragment identified within the cervical spine. As described on yesterday's exam, there are multiple skullbase fractures including a slightly displaced fracture through the base of the clivus. Also now appreciated are slightly  displaced fractures within the right  occipital condyle and a minimally displaced fracture within the far outer aspect of the left occipital condyle. Soft tissues and spinal canal: No prevertebral fluid or swelling. No visible canal hematoma. Disc levels: No significant degenerative change. No central canal stenosis. Upper chest: Unremarkable. Mild emphysematous change at the lung apices. Other: None IMPRESSION: 1. Significantly increased intraparenchymal hemorrhage within the lower left frontal lobe and left temporal lobe, with surrounding edema, now with associated local mass effect. Smaller amount of parenchymal hemorrhage within the anterior aspects of the right temporal lobe. 2. Stable appearance of the subarachnoid hemorrhage within the suprasellar cistern and anterior basal subarachnoid spaces. Now appreciated is an additional small amount of posttraumatic subarachnoid hemorrhage within the sulci of the parietal lobes bilaterally, at the level of the lateral ventricles. 3. Numerous skull base fractures and facial bone fractures without significant change compared to the earlier exam, as described in detail on the earlier exam. 4. Additional minimally displaced fractures now noted within the right occipital condyle. No malalignment at the adjacent atlanto occipital joint space. Underlying C1 vertebral body appears intact and normally aligned. Additional nondisplaced fracture noted at the far lateral aspect of the left occipital condyle. 5. No fracture or acute subluxation identified in the cervical spine. These results were called by telephone at the time of interpretation on 01/05/2016 at 8:13 pm to Dr. Georganna Skeans , who verbally acknowledged these results. Electronically Signed   By: Franki Cabot M.D.   On: 01/05/2016 20:16    Assessment/Plan: Traumatic brain injury, basal skull fractures, cerebral contusions: The patient is doing well clinically. I have spoken with his wife. He is scheduled for  surgery today.  LOS: 2 days     Patrick [name] D 01/07/2016, 9:50 AM

## 2016-01-07 NOTE — Transfer of Care (Signed)
Immediate Anesthesia Transfer of Care Note  Patient: Patrick [name]  Procedure(s) Performed: Procedure(s): OPEN REDUCTION INTERNAL FIXATION (ORIF) MANDIBULAR FRACTURE (N/A)  Patient Location: PACU  Anesthesia Type:General  Level of Consciousness: awake, patient cooperative and responds to stimulation  Airway & Oxygen Therapy: Patient Spontanous Breathing and Patient connected to face mask oxygen  Post-op Assessment: Report given to RN and Post -op Vital signs reviewed and stable  Post vital signs: Reviewed and stable  Last Vitals:  Vitals:   01/07/16 0800 01/07/16 0915  BP: (!) 164/86 (!) 168/89  Pulse: 61 65  Resp: 12 12  Temp: 36.9 C     Last Pain:  Vitals:   01/07/16 0800  TempSrc: Oral  PainSc:       Patients Stated Pain Goal: 2 ([id number])  Complications: No apparent anesthesia complications

## 2016-01-07 NOTE — H&P (View-Only) (Signed)
Subjective: Mild improvement overall,  Up to chair drinking with straw.  Objective: Vital signs in last 24 hours: Temp:  [98.1 F (36.7 C)-98.9 F (37.2 C)] 98.9 F (37.2 C) (10/01 1200) Pulse Rate:  [52-77] 66 (10/01 1400) Resp:  [9-41] 11 (10/01 1400) BP: (121-152)/(59-92) 128/63 (10/01 1400) SpO2:  [87 %-100 %] 93 % (10/01 1400) Last BM Date: 01/04/16  Intake/Output from previous day: 09/30 0701 - 10/01 0700 In: 2250 [I.V.:2250] Out: 1275 [Urine:1275] Intake/Output this shift: Total I/O In: 700 [I.V.:700] Out: -   Head: swelling and bruising as expected.  Lab Results:   Recent Labs  01/05/16 0244 01/06/16 0312  WBC 6.9 12.7*  HGB 14.3 12.7*  HCT 43.6 38.9*  PLT 178 168   BMET  Recent Labs  01/05/16 0244 01/06/16 0312  NA 142 144  K 3.7 4.0  CL 111 108  CO2 23 28  GLUCOSE 162* 143*  BUN 13 15  CREATININE 0.99 1.00  CALCIUM 8.8* 8.7*   PT/INR  Recent Labs  01/06/16 0312  LABPROT 13.8  INR 1.06   ABG No results for input(s): PHART, HCO3 in the last 72 hours.  Invalid input(s): PCO2, PO2  Studies/Results: Ct Head Without Contrast  Result Date: 01/05/2016 CLINICAL DATA:  Fall down stairs last night. Follow-up head bleed from last night. Repeat cervical spine as patient was unable to hold still for spine imaging last night. Patient with posterior neck pain. EXAM: CT HEAD WITHOUT CONTRAST CT CERVICAL SPINE WITHOUT CONTRAST TECHNIQUE: Multidetector CT imaging of the head and cervical spine was performed following the standard protocol without intravenous contrast. Multiplanar CT image reconstructions of the cervical spine were also generated. COMPARISON:  Head CT from earlier same day. FINDINGS: CT HEAD FINDINGS Brain: There is significantly increased intraparenchymal hemorrhage within the lower left frontal lobe and left temporal lobe, with surrounding edema. There is increased local mass effect. Smaller amount of parenchymal hemorrhage noted in the  anterior aspect of the right temporal lobe. The subarachnoid hemorrhage appears stable within the suprasellar cistern and anterior basal subarachnoid spaces. Now seen is a small amount of additional subarachnoid hemorrhage within the sulci of the parietal lobes bilaterally, at and just above the level of the lateral ventricles. Ventricles are stable in size and configuration. Small amount of air now appreciated in the ventricles. There is slightly increased pneumocephalus at the skullbase due to the adjacent skullbase fractures and sinus fractures. Vascular: No hyperdense vessel or unexpected calcification. Skull: Again noted are the slightly displaced/depressed fractures involving the greater wing of the sphenoid bone extending to the anterior temple bones adjacent to each TMJ. Fracture again noted across the base of the clivus. Again noted are the slightly displaced fractures along the posterior and inferior margin of the sphenoid sinus. Associated fluid again appreciated throughout the paranasal sinuses. There is again no calvarial fracture identified. Sinuses/Orbits: As above. Numerous facial bone fractures, as described in detail on earlier maxillofacial CT report. Other: None CT CERVICAL SPINE FINDINGS Alignment: Alignment of the cervical spine is normal. Skull base and vertebrae: No fracture line or displaced fracture fragment identified within the cervical spine. As described on yesterday's exam, there are multiple skullbase fractures including a slightly displaced fracture through the base of the clivus. Also now appreciated are slightly displaced fractures within the right occipital condyle and a minimally displaced fracture within the far outer aspect of the left occipital condyle. Soft tissues and spinal canal: No prevertebral fluid or swelling. No visible canal hematoma. Disc  levels: No significant degenerative change. No central canal stenosis. Upper chest: Unremarkable. Mild emphysematous change at  the lung apices. Other: None IMPRESSION: 1. Significantly increased intraparenchymal hemorrhage within the lower left frontal lobe and left temporal lobe, with surrounding edema, now with associated local mass effect. Smaller amount of parenchymal hemorrhage within the anterior aspects of the right temporal lobe. 2. Stable appearance of the subarachnoid hemorrhage within the suprasellar cistern and anterior basal subarachnoid spaces. Now appreciated is an additional small amount of posttraumatic subarachnoid hemorrhage within the sulci of the parietal lobes bilaterally, at the level of the lateral ventricles. 3. Numerous skull base fractures and facial bone fractures without significant change compared to the earlier exam, as described in detail on the earlier exam. 4. Additional minimally displaced fractures now noted within the right occipital condyle. No malalignment at the adjacent atlanto occipital joint space. Underlying C1 vertebral body appears intact and normally aligned. Additional nondisplaced fracture noted at the far lateral aspect of the left occipital condyle. 5. No fracture or acute subluxation identified in the cervical spine. These results were called by telephone at the time of interpretation on 01/05/2016 at 8:13 pm to Dr. Georganna Skeans , who verbally acknowledged these results. Electronically Signed   By: Franki Cabot M.D.   On: 01/05/2016 20:16   Ct Head Wo Contrast  Result Date: 01/05/2016 CLINICAL DATA:  Trip and fall injury down steps. Injuries to the head. Increasingly lethargic since the fall. Alcohol use. EXAM: CT HEAD WITHOUT CONTRAST CT MAXILLOFACIAL WITHOUT CONTRAST CT CERVICAL SPINE WITHOUT CONTRAST TECHNIQUE: Multidetector CT imaging of the head, cervical spine, and maxillofacial structures were performed using the standard protocol without intravenous contrast. Multiplanar CT image reconstructions of the cervical spine and maxillofacial structures were also generated.  COMPARISON:  None. FINDINGS: CT HEAD FINDINGS Brain: Pneumocephalus in the skullbase. Acute subarachnoid hemorrhage filling suprasellar cisterns and anterior basal subarachnoid spaces. Can't exclude small intraparenchymal hematomas around the periphery. No mass effect or midline shift. Gray-white matter junctions are distinct. Basal cisterns are not effaced. Ventricles are not dilated. Vascular: No hyperdense vessel or unexpected calcification. Skull: Mildly depressed basal skull fractures involving the greater wing of the sphenoid bone extending to the anterior temporal bone at the temporomandibular joint. Fractures across the base of the clivus extending from right to left. Non depressed fractures of the left sphenoid bone involving the greater wing, extending to the sphenoid sinus and to the temporomandibular joint. Calvarium appears intact. Sinuses/Orbits: See below. Mastoid air cells are not opacified. External and middle ear cavities are not opacified. Other: Examination is technically limited due to significant motion artifact throughout the examination. CT MAXILLOFACIAL FINDINGS Osseous: Depressed anterior nasal bone fractures. Fractures of the medial orbital walls bilaterally and of the left superior and inferior orbital walls. Fractures of the lateral, anterior, and inferior maxillary antral walls bilaterally. Fractures of the pterygoid plates on the right. Displaced fractures of the posterior nasal septum. Fracture of the right hard palate and maxilla. Mandibles and temporomandibular joints appear intact. Orbits: Globes and extraocular muscles appear intact and symmetrical. The no evidence of herniation or entrapment of the extraocular muscles. Bilateral periorbital hematomas and emphysematous changes. Bilateral extraconal retrobulbar emphysema, greater on the left. Sinuses: Opacification of the left frontal, bilateral ethmoid, bilateral sphenoid, and bilateral maxillary sinuses. Soft tissues: Prominent  soft tissue swelling and subcutaneous emphysema in the facial regions inferior to the orbits and along the cheeks bilaterally. Soft tissue emphysema demonstrated in the masticator muscle spaces and along the  right mandible. Limited intracranial: See above. Other:  Examination is limited due to motion artifact. CT CERVICAL SPINE FINDINGS Alignment: Normal Skull base and vertebrae: Fractures of the skullbase as previously discussed. No vertebral compression deformities. Ununited ossicles are demonstrated at the anterior inferior endplate of C3 and at the anterior superior endplate of C6 which are not definitely corticated and could represent corner fractures. The posterior elements appear intact. C1-2 articulation appears intact. Soft tissues and spinal canal: Gas bubbles demonstrated within the central canal at the level of C2 through C5. This likely represents gas within the CSF space, tracking down from the intracranial contents. No prevertebral soft tissue swelling. No central canal hematoma demonstrated. Disc levels: Mild degenerative changes in the cervical spine with mild endplate hypertrophic changes present. Upper chest: Emphysematous changes in the lung apices. Other: Examination is limited due to motion artifact. IMPRESSION: CT head: Acute intracranial subarachnoid and possibly intraparenchymal hemorrhages along the anterior frontal region. Acute hemorrhage in the suprasellar cisterns. Pneumocephalus. No mass-effect or midline shift. CT maxillary: Fractures as described throughout the skull base involving the sphenoid bones, clivus, and temporomandibular joints. Fractures of bilateral orbits and maxillary antral walls as described. Fractures also involve the pterygoid plates on the right. Associated soft tissue hematomas and soft tissue emphysema in the orbital and facial regions as described. CT cervical spine: Normal alignment. No acute displaced fractures, but cannot exclude anterior superior endplate  corner fracture at C6 and anterior inferior endplate on a fracture at C3. Gas within the spinal canal, probably within the CSF space. These results were called by telephone at the time of interpretation on 01/05/2016 at 3:49 am to Dr. Quintella Reichert , who verbally acknowledged these results. Electronically Signed   By: Lucienne Capers M.D.   On: 01/05/2016 04:09   Ct Cervical Spine Wo Contrast  Result Date: 01/05/2016 CLINICAL DATA:  Fall down stairs last night. Follow-up head bleed from last night. Repeat cervical spine as patient was unable to hold still for spine imaging last night. Patient with posterior neck pain. EXAM: CT HEAD WITHOUT CONTRAST CT CERVICAL SPINE WITHOUT CONTRAST TECHNIQUE: Multidetector CT imaging of the head and cervical spine was performed following the standard protocol without intravenous contrast. Multiplanar CT image reconstructions of the cervical spine were also generated. COMPARISON:  Head CT from earlier same day. FINDINGS: CT HEAD FINDINGS Brain: There is significantly increased intraparenchymal hemorrhage within the lower left frontal lobe and left temporal lobe, with surrounding edema. There is increased local mass effect. Smaller amount of parenchymal hemorrhage noted in the anterior aspect of the right temporal lobe. The subarachnoid hemorrhage appears stable within the suprasellar cistern and anterior basal subarachnoid spaces. Now seen is a small amount of additional subarachnoid hemorrhage within the sulci of the parietal lobes bilaterally, at and just above the level of the lateral ventricles. Ventricles are stable in size and configuration. Small amount of air now appreciated in the ventricles. There is slightly increased pneumocephalus at the skullbase due to the adjacent skullbase fractures and sinus fractures. Vascular: No hyperdense vessel or unexpected calcification. Skull: Again noted are the slightly displaced/depressed fractures involving the greater wing of the  sphenoid bone extending to the anterior temple bones adjacent to each TMJ. Fracture again noted across the base of the clivus. Again noted are the slightly displaced fractures along the posterior and inferior margin of the sphenoid sinus. Associated fluid again appreciated throughout the paranasal sinuses. There is again no calvarial fracture identified. Sinuses/Orbits: As above. Numerous facial bone  fractures, as described in detail on earlier maxillofacial CT report. Other: None CT CERVICAL SPINE FINDINGS Alignment: Alignment of the cervical spine is normal. Skull base and vertebrae: No fracture line or displaced fracture fragment identified within the cervical spine. As described on yesterday's exam, there are multiple skullbase fractures including a slightly displaced fracture through the base of the clivus. Also now appreciated are slightly displaced fractures within the right occipital condyle and a minimally displaced fracture within the far outer aspect of the left occipital condyle. Soft tissues and spinal canal: No prevertebral fluid or swelling. No visible canal hematoma. Disc levels: No significant degenerative change. No central canal stenosis. Upper chest: Unremarkable. Mild emphysematous change at the lung apices. Other: None IMPRESSION: 1. Significantly increased intraparenchymal hemorrhage within the lower left frontal lobe and left temporal lobe, with surrounding edema, now with associated local mass effect. Smaller amount of parenchymal hemorrhage within the anterior aspects of the right temporal lobe. 2. Stable appearance of the subarachnoid hemorrhage within the suprasellar cistern and anterior basal subarachnoid spaces. Now appreciated is an additional small amount of posttraumatic subarachnoid hemorrhage within the sulci of the parietal lobes bilaterally, at the level of the lateral ventricles. 3. Numerous skull base fractures and facial bone fractures without significant change compared to  the earlier exam, as described in detail on the earlier exam. 4. Additional minimally displaced fractures now noted within the right occipital condyle. No malalignment at the adjacent atlanto occipital joint space. Underlying C1 vertebral body appears intact and normally aligned. Additional nondisplaced fracture noted at the far lateral aspect of the left occipital condyle. 5. No fracture or acute subluxation identified in the cervical spine. These results were called by telephone at the time of interpretation on 01/05/2016 at 8:13 pm to Dr. Georganna Skeans , who verbally acknowledged these results. Electronically Signed   By: Franki Cabot M.D.   On: 01/05/2016 20:16   Ct Cervical Spine Wo Contrast  Result Date: 01/05/2016 CLINICAL DATA:  Trip and fall injury down steps. Injuries to the head. Increasingly lethargic since the fall. Alcohol use. EXAM: CT HEAD WITHOUT CONTRAST CT MAXILLOFACIAL WITHOUT CONTRAST CT CERVICAL SPINE WITHOUT CONTRAST TECHNIQUE: Multidetector CT imaging of the head, cervical spine, and maxillofacial structures were performed using the standard protocol without intravenous contrast. Multiplanar CT image reconstructions of the cervical spine and maxillofacial structures were also generated. COMPARISON:  None. FINDINGS: CT HEAD FINDINGS Brain: Pneumocephalus in the skullbase. Acute subarachnoid hemorrhage filling suprasellar cisterns and anterior basal subarachnoid spaces. Can't exclude small intraparenchymal hematomas around the periphery. No mass effect or midline shift. Gray-white matter junctions are distinct. Basal cisterns are not effaced. Ventricles are not dilated. Vascular: No hyperdense vessel or unexpected calcification. Skull: Mildly depressed basal skull fractures involving the greater wing of the sphenoid bone extending to the anterior temporal bone at the temporomandibular joint. Fractures across the base of the clivus extending from right to left. Non depressed fractures of  the left sphenoid bone involving the greater wing, extending to the sphenoid sinus and to the temporomandibular joint. Calvarium appears intact. Sinuses/Orbits: See below. Mastoid air cells are not opacified. External and middle ear cavities are not opacified. Other: Examination is technically limited due to significant motion artifact throughout the examination. CT MAXILLOFACIAL FINDINGS Osseous: Depressed anterior nasal bone fractures. Fractures of the medial orbital walls bilaterally and of the left superior and inferior orbital walls. Fractures of the lateral, anterior, and inferior maxillary antral walls bilaterally. Fractures of the pterygoid plates on the right.  Displaced fractures of the posterior nasal septum. Fracture of the right hard palate and maxilla. Mandibles and temporomandibular joints appear intact. Orbits: Globes and extraocular muscles appear intact and symmetrical. The no evidence of herniation or entrapment of the extraocular muscles. Bilateral periorbital hematomas and emphysematous changes. Bilateral extraconal retrobulbar emphysema, greater on the left. Sinuses: Opacification of the left frontal, bilateral ethmoid, bilateral sphenoid, and bilateral maxillary sinuses. Soft tissues: Prominent soft tissue swelling and subcutaneous emphysema in the facial regions inferior to the orbits and along the cheeks bilaterally. Soft tissue emphysema demonstrated in the masticator muscle spaces and along the right mandible. Limited intracranial: See above. Other:  Examination is limited due to motion artifact. CT CERVICAL SPINE FINDINGS Alignment: Normal Skull base and vertebrae: Fractures of the skullbase as previously discussed. No vertebral compression deformities. Ununited ossicles are demonstrated at the anterior inferior endplate of C3 and at the anterior superior endplate of C6 which are not definitely corticated and could represent corner fractures. The posterior elements appear intact. C1-2  articulation appears intact. Soft tissues and spinal canal: Gas bubbles demonstrated within the central canal at the level of C2 through C5. This likely represents gas within the CSF space, tracking down from the intracranial contents. No prevertebral soft tissue swelling. No central canal hematoma demonstrated. Disc levels: Mild degenerative changes in the cervical spine with mild endplate hypertrophic changes present. Upper chest: Emphysematous changes in the lung apices. Other: Examination is limited due to motion artifact. IMPRESSION: CT head: Acute intracranial subarachnoid and possibly intraparenchymal hemorrhages along the anterior frontal region. Acute hemorrhage in the suprasellar cisterns. Pneumocephalus. No mass-effect or midline shift. CT maxillary: Fractures as described throughout the skull base involving the sphenoid bones, clivus, and temporomandibular joints. Fractures of bilateral orbits and maxillary antral walls as described. Fractures also involve the pterygoid plates on the right. Associated soft tissue hematomas and soft tissue emphysema in the orbital and facial regions as described. CT cervical spine: Normal alignment. No acute displaced fractures, but cannot exclude anterior superior endplate corner fracture at C6 and anterior inferior endplate on a fracture at C3. Gas within the spinal canal, probably within the CSF space. These results were called by telephone at the time of interpretation on 01/05/2016 at 3:49 am to Dr. Quintella Reichert , who verbally acknowledged these results. Electronically Signed   By: Lucienne Capers M.D.   On: 01/05/2016 04:09   Dg Chest Port 1 View  Result Date: 01/05/2016 CLINICAL DATA:  Altered mental status after fall down stairs. Alcohol use. EXAM: PORTABLE CHEST 1 VIEW COMPARISON:  None. FINDINGS: Shallow inspiration. Borderline heart size with normal pulmonary vascularity, likely normal for technique. No blunting of costophrenic angles. No pneumothorax. No  focal airspace disease or consolidation in the lungs. Calcified and tortuous aorta. IMPRESSION: Shallow inspiration.  No evidence of active pulmonary disease. Electronically Signed   By: Lucienne Capers M.D.   On: 01/05/2016 03:10   Ct Maxillofacial Wo Cm  Result Date: 01/05/2016 CLINICAL DATA:  Trip and fall injury down steps. Injuries to the head. Increasingly lethargic since the fall. Alcohol use. EXAM: CT HEAD WITHOUT CONTRAST CT MAXILLOFACIAL WITHOUT CONTRAST CT CERVICAL SPINE WITHOUT CONTRAST TECHNIQUE: Multidetector CT imaging of the head, cervical spine, and maxillofacial structures were performed using the standard protocol without intravenous contrast. Multiplanar CT image reconstructions of the cervical spine and maxillofacial structures were also generated. COMPARISON:  None. FINDINGS: CT HEAD FINDINGS Brain: Pneumocephalus in the skullbase. Acute subarachnoid hemorrhage filling suprasellar cisterns and anterior basal subarachnoid  spaces. Can't exclude small intraparenchymal hematomas around the periphery. No mass effect or midline shift. Gray-white matter junctions are distinct. Basal cisterns are not effaced. Ventricles are not dilated. Vascular: No hyperdense vessel or unexpected calcification. Skull: Mildly depressed basal skull fractures involving the greater wing of the sphenoid bone extending to the anterior temporal bone at the temporomandibular joint. Fractures across the base of the clivus extending from right to left. Non depressed fractures of the left sphenoid bone involving the greater wing, extending to the sphenoid sinus and to the temporomandibular joint. Calvarium appears intact. Sinuses/Orbits: See below. Mastoid air cells are not opacified. External and middle ear cavities are not opacified. Other: Examination is technically limited due to significant motion artifact throughout the examination. CT MAXILLOFACIAL FINDINGS Osseous: Depressed anterior nasal bone fractures. Fractures  of the medial orbital walls bilaterally and of the left superior and inferior orbital walls. Fractures of the lateral, anterior, and inferior maxillary antral walls bilaterally. Fractures of the pterygoid plates on the right. Displaced fractures of the posterior nasal septum. Fracture of the right hard palate and maxilla. Mandibles and temporomandibular joints appear intact. Orbits: Globes and extraocular muscles appear intact and symmetrical. The no evidence of herniation or entrapment of the extraocular muscles. Bilateral periorbital hematomas and emphysematous changes. Bilateral extraconal retrobulbar emphysema, greater on the left. Sinuses: Opacification of the left frontal, bilateral ethmoid, bilateral sphenoid, and bilateral maxillary sinuses. Soft tissues: Prominent soft tissue swelling and subcutaneous emphysema in the facial regions inferior to the orbits and along the cheeks bilaterally. Soft tissue emphysema demonstrated in the masticator muscle spaces and along the right mandible. Limited intracranial: See above. Other:  Examination is limited due to motion artifact. CT CERVICAL SPINE FINDINGS Alignment: Normal Skull base and vertebrae: Fractures of the skullbase as previously discussed. No vertebral compression deformities. Ununited ossicles are demonstrated at the anterior inferior endplate of C3 and at the anterior superior endplate of C6 which are not definitely corticated and could represent corner fractures. The posterior elements appear intact. C1-2 articulation appears intact. Soft tissues and spinal canal: Gas bubbles demonstrated within the central canal at the level of C2 through C5. This likely represents gas within the CSF space, tracking down from the intracranial contents. No prevertebral soft tissue swelling. No central canal hematoma demonstrated. Disc levels: Mild degenerative changes in the cervical spine with mild endplate hypertrophic changes present. Upper chest: Emphysematous  changes in the lung apices. Other: Examination is limited due to motion artifact. IMPRESSION: CT head: Acute intracranial subarachnoid and possibly intraparenchymal hemorrhages along the anterior frontal region. Acute hemorrhage in the suprasellar cisterns. Pneumocephalus. No mass-effect or midline shift. CT maxillary: Fractures as described throughout the skull base involving the sphenoid bones, clivus, and temporomandibular joints. Fractures of bilateral orbits and maxillary antral walls as described. Fractures also involve the pterygoid plates on the right. Associated soft tissue hematomas and soft tissue emphysema in the orbital and facial regions as described. CT cervical spine: Normal alignment. No acute displaced fractures, but cannot exclude anterior superior endplate corner fracture at C6 and anterior inferior endplate on a fracture at C3. Gas within the spinal canal, probably within the CSF space. These results were called by telephone at the time of interpretation on 01/05/2016 at 3:49 am to Dr. Quintella Reichert , who verbally acknowledged these results. Electronically Signed   By: Lucienne Capers M.D.   On: 01/05/2016 04:09    Anti-infectives: Anti-infectives    None      Assessment/Plan: s/p * No surgery  found * OR tomorrow for facial fracture repair.  NPO after midnight.  LOS: 1 day    Wallace Going 01/06/2016

## 2016-01-07 NOTE — Progress Notes (Signed)
Wire cutters at bedside 

## 2016-01-08 ENCOUNTER — Encounter (HOSPITAL_COMMUNITY): Payer: Self-pay | Admitting: Physical Medicine and Rehabilitation

## 2016-01-08 DIAGNOSIS — S069X1A Unspecified intracranial injury with loss of consciousness of 30 minutes or less, initial encounter: Secondary | ICD-10-CM

## 2016-01-08 DIAGNOSIS — S066X9A Traumatic subarachnoid hemorrhage with loss of consciousness of unspecified duration, initial encounter: Secondary | ICD-10-CM

## 2016-01-08 LAB — BASIC METABOLIC PANEL
ANION GAP: 6 (ref 5–15)
BUN: 10 mg/dL (ref 6–20)
CO2: 27 mmol/L (ref 22–32)
Calcium: 9.3 mg/dL (ref 8.9–10.3)
Chloride: 100 mmol/L — ABNORMAL LOW (ref 101–111)
Creatinine, Ser: 0.73 mg/dL (ref 0.61–1.24)
GFR calc Af Amer: >60 mL/min (ref >60)
GLUCOSE: 139 mg/dL — AB (ref 65–99)
POTASSIUM: 4 mmol/L (ref 3.5–5.1)
Sodium: 133 mmol/L — ABNORMAL LOW (ref 135–145)

## 2016-01-08 LAB — CBC
HEMATOCRIT: 39.3 % (ref 39.0–52.0)
HEMOGLOBIN: 13.1 g/dL (ref 13.0–17.0)
MCH: 30 pg (ref 26.0–34.0)
MCHC: 33.3 g/dL (ref 30.0–36.0)
MCV: 90.1 fL (ref 78.0–100.0)
Platelets: 190 10*3/uL (ref 150–400)
RBC: 4.36 MIL/uL (ref 4.22–5.81)
RDW: 12.7 % (ref 11.5–15.5)
WBC: 11.3 10*3/uL — AB (ref 4.0–10.5)

## 2016-01-08 MED ORDER — TRAMADOL 5 MG/ML ORAL SUSPENSION
50.0000 mg | Freq: Four times a day (QID) | ORAL | Status: DC
  Administered 2016-01-08 – 2016-01-10 (×9): 50 mg via ORAL
  Filled 2016-01-08 (×9): qty 2

## 2016-01-08 MED ORDER — TRAMADOL HCL 50 MG PO TABS: 50.0000 mg | ORAL_TABLET | Freq: Four times a day (QID) | ORAL | Status: DC

## 2016-01-08 MED ORDER — PHENOL 1.4 % MT LIQD: 1.0000 | OROMUCOSAL | Status: DC | PRN

## 2016-01-08 MED ORDER — ADULT MULTIVITAMIN LIQUID CH
15.0000 mL | Freq: Every day | ORAL | Status: DC
  Administered 2016-01-08 – 2016-01-22 (×15): 15 mL via ORAL
  Filled 2016-01-08 (×15): qty 15

## 2016-01-08 NOTE — Progress Notes (Signed)
Patient ID: Patrick [name], male   DOB: 01/21/1956, 60 y.o.   MRN: [id number] I met with his wife and updated her. I described the process regarding evaluation for inpatient rehab. Georganna Skeans, MD, MPH, FACS Trauma: [phone] General Surgery: [phone]

## 2016-01-08 NOTE — Care Management Note (Signed)
Case Management Note  Patient Details  Name: Patrick [name] MRN: [id number] Date of Birth: [birth year]  Subjective/Objective:   Pt admitted on 01/05/16 s/p fall down stairs with TBI/bilateral frontal ICC/basilar skull fx, bilateral maxillary sinus/palate/bilat TMJ/bilat orbit fx, and occipital condyle fx.  PTA, pt independent, lives with spouse.                 Action/Plan: Will follow for discharge planning as pt progresses.    Expected Discharge Date:                  Expected Discharge Plan:  [place]  In-House Referral:     Discharge planning Services  CM Consult  Post Acute Care Choice:    Choice offered to:     DME Arranged:    DME Agency:     HH Arranged:    [place] Agency:     Status of Service:  In process, will continue to follow  If discussed at Long Length of Stay Meetings, dates discussed:    Additional Comments:  Reinaldo Raddle, RN, BSN  Trauma/Neuro ICU Case Manager [phone]

## 2016-01-08 NOTE — Consult Note (Signed)
Physical Medicine and Rehabilitation Consult   Reason for Consult: TBI Referring Physician: Dr. Grandville Silos   HPI: Patrick [name] is a 60 y.o. male who was intoxicated, fell down 7 stairs and landed on his face. He was evaluated in ED on 01/05/16 and  CT head done showing multiple facial fractures, basilar skull fracture and acute SAH/IPH along anterior frontal region. Dr. Arnoldo Morale evaluated patient and recommended monitoring as patient at risk for CSF bleed. To continue aspen collar till able to undergo flexion/extension views.   He underwent maxillomandibular fixation by Dr. Marla Roe on 10/2 and to continue on liquid diet. Therapy evaluations completed today and CIR recommended due to cognitive deficits, impulsivity, poor safety due to TBI.     Review of Systems  Unable to perform ROS: Mental acuity  HENT: Negative for hearing loss.   Respiratory: Negative for shortness of breath.   Cardiovascular: Negative for chest pain and leg swelling.  Musculoskeletal: Positive for myalgias.  Skin: Negative for rash.  Neurological: Positive for headaches. Negative for speech change and focal weakness.  Psychiatric/Behavioral: The patient is not nervous/anxious.   All other systems reviewed and are negative.     Past Medical History:  Diagnosis Date  . Coronary artery disease    moderate  . Hyperlipidemia   . TIA (transient ischemic attack)     History reviewed. No pertinent surgical history.    Family History  Problem Relation Age of Onset  . Cancer Mother   . Heart disease Father       Social History:  Married. Works as a Hotel manager. Wife works 3 days a week as an Astronomer ---had multiple financial concerns. He reports that he quit smoking about 21 years ago. His smoking use included Cigarettes. He has never used smokeless tobacco. He reports that he drinks alcohol--about 10 drinks per week. He reports that he does not use drugs.    Allergies: No Known Allergies     Medications Prior to Admission  Medication Sig Dispense Refill  . aspirin 325 MG tablet Take 325 mg by mouth daily.    . Coenzyme Q10 (EQL COQ10) 300 MG CAPS Take 1 capsule by mouth daily.    . rosuvastatin (CRESTOR) 20 MG tablet Take 20 mg by mouth daily.    . tamsulosin (FLOMAX) 0.4 MG CAPS capsule Take 0.4 mg by mouth.      Home: Home Living Family/patient expects to be discharged to:: Private residence Living Arrangements: Spouse/significant other Available Help at Discharge: Other (Comment) (wife work a few days a week, no other support) Type of Home: House Home Access: Stairs to enter Technical brewer of Steps: 3 Home Layout: Two level, Able to live on main level with bedroom/bathroom Bathroom Shower/Tub: Multimedia programmer: Standard Home Equipment: None  Lives With: Spouse  Functional History: Prior Function Level of Independence: Independent Comments: goes hiking every wkend Functional Status:  Mobility: Bed Mobility Overal bed mobility: Needs Assistance Bed Mobility: Supine to Sit Supine to sit: Min guard General bed mobility comments: min guard for safey, VCs for impulsivity Transfers Overall transfer level: Needs assistance Equipment used: None Transfers: Sit to/from Stand Sit to Stand: Min guard General transfer comment: contines to require min guard for safety, VCs for impulsivity Ambulation/Gait Ambulation/Gait assistance: Min guard Ambulation Distance (Feet): 180 Feet Assistive device: None Gait Pattern/deviations: WFL(Within Functional Limits) General Gait Details: min guard for safety with line management Gait velocity: decreased Gait velocity interpretation: Below normal speed for age/gender  ADL: ADL Overall ADL's : Needs assistance/impaired Eating/Feeding: Set up Grooming: Wash/dry face, Set up Upper Body Bathing: Minimal assitance, Min guard Lower Body Dressing: Min guard Lower Body Dressing Details (indicate cue  type and reason): requires cues to sequence and once cued able to long sit and don bil socks Toilet Transfer: Min guard Functional mobility during ADLs: Minimal assistance General ADL Comments: Pt demonstrates executive cognitives that will affect all adls and iadls. Pt is a fall risk   Cognition: Cognition Overall Cognitive Status: Impaired/Different from baseline Arousal/Alertness: Awake/alert Orientation Level: Oriented X4 Attention: Focused, Sustained, Selective Focused Attention: Appears intact Sustained Attention: Appears intact Selective Attention: Impaired Selective Attention Impairment: Verbal basic, Functional basic Memory: Impaired Memory Impairment: Storage deficit, Retrieval deficit, Decreased short term memory Decreased Short Term Memory: Verbal basic, Functional basic Awareness: Impaired Awareness Impairment: Emergent impairment, Anticipatory impairment Problem Solving: Appears intact Executive Function: Reasoning Reasoning: Impaired Reasoning Impairment: Verbal complex [place] [employer] Scales of Cognitive Functioning: Automatic/appropriate Cognition Arousal/Alertness: Awake/alert Behavior During Therapy: Impulsive Overall Cognitive Status: Impaired/Different from baseline Area of Impairment: Attention, Safety/judgement, Awareness, Orientation Orientation Level: Situation (pt reports "they say" and not completely convinced) Current Attention Level: Sustained Memory: Decreased short-term memory Safety/Judgement: Decreased awareness of deficits, Decreased awareness of safety Awareness: Emergent General Comments: Pt able to state he is at hospital and its in [place] but unabel to name the hospital. pt reports first memory is arrival to hospital but uncertain how or why he came. pt reports "they said I was found by a family member and maybe fell down some steps" Pt reports he knows who his nurse is but unable to answer questions regarding nurse and states "well they  change around here" pt oriented to time place and reason for admission during session. Injuries reviewed with patient. Pt shown mirror to help with awareness not to pull at strings located in mouth  Blood pressure (!) 169/88, pulse (!) 55, temperature 97.3 F (36.3 C), resp. rate 14, height 6' (1.829 m), weight 82.4 kg (181 lb 10.5 oz), SpO2 97 %. Physical Exam  Nursing note and vitals reviewed. Constitutional: He is oriented to person, place, and time. He appears well-developed and well-nourished. He appears lethargic. He is easily aroused.  HENT:  Head: Normocephalic and atraumatic.  Mouth/Throat: Oropharynx is clear and moist.  Healing abrasions left forehead.   Eyes: Conjunctivae are normal. Pupils are equal, round, and reactive to light.  Periorbital ecchymosis. Needed cues to open eyes and interact  Neck:  Cervical collar in place  Cardiovascular: Normal rate and regular rhythm.   Respiratory: Effort normal. No stridor. No respiratory distress. He has wheezes.  GI: Soft. Bowel sounds are normal. He exhibits no distension. There is no tenderness.  Musculoskeletal: He exhibits no edema.  Neurological: He is oriented to person, place, and time and easily aroused. He appears lethargic.  Speech clear. Able to follow simple motor commands with perseveration. Impaired attention needing redirection to focus. Impulsive. Able to move all four.   Skin: Skin is warm and dry.  Motor strength is 4 plus bilateral deltoid, biceps, triceps, grip, hip flexor, knee extension, ankle dorsi flexion, plantar flexion. Patient is oriented to person and place as well as time. He can describe his fall in general terms, but does not give any detail  Results for orders placed or performed during the hospital encounter of 01/05/16 (from the past 24 hour(s))  CBC     Status: Abnormal   Collection Time: 01/08/16  3:45 AM  Result Value Ref Range   WBC 11.3 (H) 4.0 - 10.5 K/uL   RBC 4.36 4.22 - 5.81 MIL/uL    Hemoglobin 13.1 13.0 - 17.0 g/dL   HCT 39.3 39.0 - 52.0 %   MCV 90.1 78.0 - 100.0 fL   MCH 30.0 26.0 - 34.0 pg   MCHC 33.3 30.0 - 36.0 g/dL   RDW 12.7 11.5 - 15.5 %   Platelets 190 150 - 400 K/uL  Basic metabolic panel     Status: Abnormal   Collection Time: 01/08/16  3:45 AM  Result Value Ref Range   Sodium 133 (L) 135 - 145 mmol/L   Potassium 4.0 3.5 - 5.1 mmol/L   Chloride 100 (L) 101 - 111 mmol/L   CO2 27 22 - 32 mmol/L   Glucose, Bld 139 (H) 65 - 99 mg/dL   BUN 10 6 - 20 mg/dL   Creatinine, Ser 0.73 0.61 - 1.24 mg/dL   Calcium 9.3 8.9 - 10.3 mg/dL   GFR calc non Af Amer >60 >60 mL/min   GFR calc Af Amer >60 >60 mL/min   Anion gap 6 5 - 15   No results found.  Assessment/Plan: Diagnosis: Mild traumatic brain injury, subarachnoid hemorrhage after fall 1. Does the need for close, 24 hr/day medical supervision in concert with the patient's rehab needs make it unreasonable for this patient to be served in a less intensive setting? Yes 2. Co-Morbidities requiring supervision/potential complications: Hypertension, uncontrolled, multiple facial fractures, cervical spine injury 3. Due to bladder management, bowel management, safety, skin/wound care, disease management, medication administration, pain management and patient education, does the patient require 24 hr/day rehab nursing? Yes 4. Does the patient require coordinated care of a physician, rehab nurse, PT (1-2 hrs/day, 5 days/week), OT (1-2 hrs/day, 5 days/week) and SLP (.5-1 hrs/day, 5 days/week) to address physical and functional deficits in the context of the above medical diagnosis(es)? Yes Addressing deficits in the following areas: balance, endurance, locomotion, strength, transferring, bowel/bladder control, bathing, dressing, feeding, grooming, toileting, cognition, speech and psychosocial support 5. Can the patient actively participate in an intensive therapy program of at least 3 hrs of therapy per day at least 5 days per  week? Yes 6. The potential for patient to make measurable gains while on inpatient rehab is excellent 7. Anticipated functional outcomes upon discharge from inpatient rehab are modified independent and supervision  with PT, modified independent and supervision with OT, modified independent and supervision with SLP. 8. Estimated rehab length of stay to reach the above functional goals is: 7d 9. Does the patient have adequate social supports and living environment to accommodate these discharge functional goals? Yes 10. Anticipated D/C setting: Home 11. Anticipated post D/C treatments: [place] therapy 12. Overall Rehab/Functional Prognosis: excellent  RECOMMENDATIONS: This patient's condition is appropriate for continued rehabilitative care in the following setting: CIR Patient has agreed to participate in recommended program. Yes Note that insurance prior authorization may be required for reimbursement for recommended care.  Comment: The patient is concerned about insurance coverage    01/08/2016

## 2016-01-08 NOTE — Evaluation (Signed)
Occupational Therapy Evaluation/ TBI TEAM  Patient Details Name: Patrick [name] MRN: [id number] DOB: 1955/12/13 Today's Date: 01/08/2016    History of Present Illness 60 year old white male who was intoxicated with an alcohol level of 285. He fell down some steps landing on his face. Workup included a head CT which demonstrated multiple facial and basilar skull fractures as well as a traumatic subarachnoid hemorrhage. There is fluid in the ethmoid and sphenoid sinuses. Pt is at risk for a CSF leak. CT includes palate and TMJ fractures with maxillary sinus   Clinical Impression   Patient is s/p ORIF mandibula surgery resulting in functional limitations due to the deficits listed below (see OT problem list). PTA was independent. Pt currently with executive cognitive deficits and demonstrates behavior consistent with [place] recovery level VI. Pt voiding bladder into a cup on breakfast tray due to inability to locate urinal instead of calling for assistance.   Patient will benefit from skilled OT acutely to increase independence and safety with ADLS to allow discharge CIR.     Follow Up Recommendations  CIR    Equipment Recommendations  None recommended by OT    Recommendations for Other Services Rehab consult     Precautions / Restrictions Precautions Precautions: Cervical;Fall Precaution Comments: no nose blowing Restrictions Weight Bearing Restrictions: No      Mobility Bed Mobility Overal bed mobility: Needs Assistance Bed Mobility: Supine to Sit     Supine to sit: Min guard     General bed mobility comments: min guard for safey, VCs for impulsivity  Transfers Overall transfer level: Needs assistance Equipment used: None Transfers: Sit to/from Stand Sit to Stand: Min guard         General transfer comment: min guard for safety, VCs for impulsivity    Balance     Sitting balance-Leahy Scale: Good       Standing balance-Leahy Scale: Fair                              ADL Overall ADL's : Needs assistance/impaired Eating/Feeding: Set up   Grooming: Wash/dry face;Set up   Upper Body Bathing: Minimal assitance;Min guard           Lower Body Dressing: Min guard Lower Body Dressing Details (indicate cue type and reason): requires cues to sequence and once cued able to long sit and don bil socks Toilet Transfer: Min guard           Functional mobility during ADLs: Minimal assistance General ADL Comments: Pt demonstrates executive cognitives that will affect all adls and iadls. Pt is a fall risk      Vision     Perception     Praxis      Pertinent Vitals/Pain Pain Assessment: 0-10 Pain Score: 7  Pain Location: head Pain Descriptors / Indicators: Grimacing Pain Intervention(s): Repositioned;Premedicated before session;Monitored during session;Limited activity within patient's tolerance     Hand Dominance Right   Extremity/Trunk Assessment Upper Extremity Assessment Upper Extremity Assessment: Generalized weakness   Lower Extremity Assessment Lower Extremity Assessment: Defer to PT evaluation;RLE deficits/detail RLE Deficits / Details: pt with knee flexion with transfers noted   Cervical / Trunk Assessment Cervical / Trunk Assessment: Normal   Communication Communication Communication: No difficulties   Cognition Arousal/Alertness: Awake/alert Behavior During Therapy: Impulsive Overall Cognitive Status: Impaired/Different from baseline Area of Impairment: Attention;Safety/judgement;Awareness;Orientation Orientation Level: Situation (pt reports "they say" and not completely convinced) Current Attention  Level: Sustained Memory: Decreased short-term memory   Safety/Judgement: Decreased awareness of deficits;Decreased awareness of safety Awareness: Emergent   General Comments: Pt able to state he is at hospital and its in [place] but unabel to name the hospital. pt reports first memory is  arrival to hospital but uncertain how or why he came. pt reports "they said I was found by a family member and maybe fell down some steps" Pt reports he knows who his nurse is but unable to answer questions regarding nurse and states "well they change around here" pt oriented to time place and reason for admission during session. Injuries reviewed with patient. Pt shown mirror to help with awareness not to pull at strings located in mouth   General Comments       Exercises       Shoulder Instructions      Home Living Family/patient expects to be discharged to:: Private residence Living Arrangements: Spouse/significant other Available Help at Discharge: Family Type of Home: House Home Access: Stairs to enter [employer] of Steps: [place]: Two level;Able to live on main level with bedroom/bathroom     Bathroom Shower/Tub: Occupational psychologist: Standard     Home Equipment: None          Prior Functioning/Environment Level of Independence: Independent        Comments: goes hiking every wkend        OT Problem List: Decreased strength;Decreased activity tolerance;Impaired balance (sitting and/or standing);Decreased safety awareness;Decreased knowledge of use of DME or AE;Decreased knowledge of precautions;Pain;Decreased cognition   OT Treatment/Interventions: Self-care/ADL training;Therapeutic exercise;Neuromuscular education;DME and/or AE instruction;Therapeutic activities;Cognitive remediation/compensation;Patient/family education;Balance training    OT Goals(Current goals can be found in the care plan section) Acute Rehab OT Goals Patient Stated Goal: to go home OT Goal Formulation: Patient unable to participate in goal setting Time For Goal Achievement: 01/22/16 Potential to Achieve Goals: Good  OT Frequency: Min 2X/week   Barriers to D/C:            Co-evaluation              End of Session Equipment Utilized During  Treatment: Gait belt;Cervical collar Nurse Communication: Mobility status;Precautions  Activity Tolerance: Patient tolerated treatment well Patient left: in chair;with call bell/phone within reach;with chair alarm set   Time: 1020-1045 OT Time Calculation (min): 25 min Charges:  OT General Charges $OT Visit: 1 Procedure OT Evaluation $OT Eval Moderate Complexity: 1 Procedure G-Codes:    Peri Maris January 10, 2016, 2:00 PM  Jeri Modena   OTR/L Pager: [phone] Office: [phone] .

## 2016-01-08 NOTE — Progress Notes (Signed)
Patient ID: Patrick [name], male   DOB: 07/02/1955, 60 y.o.   MRN: [id number] Subjective:  The patient is alert and pleasant. He is in no apparent distress.  Objective: Vital signs in last 24 hours: Temp:  [98.1 F (36.7 C)-99.5 F (37.5 C)] 98.4 F (36.9 C) (10/03 0400) Pulse Rate:  [51-86] 53 (10/03 0600) Resp:  [8-26] 12 (10/03 0600) BP: (132-209)/(63-121) 165/91 (10/03 0600) SpO2:  [88 %-98 %] 97 % (10/03 0600)  Intake/Output from previous day: 10/02 0701 - 10/03 0700 In: 2650 [I.V.:2550; IV Piggyback:100] Out: 2850 [Urine:2850] Intake/Output this shift: No intake/output data recorded.  Physical exam the patient is alert and oriented. His speech is normal. He moves all 4 extremities well. I don't see any CSF otorrhea or rhinorrhea.  Lab Results:  Recent Labs  01/07/16 0327 01/08/16 0345  WBC 15.3* 11.3*  HGB 12.1* 13.1  HCT 38.2* 39.3  PLT 159 190   BMET  Recent Labs  01/06/16 0312 01/08/16 0345  NA 144 133*  K 4.0 4.0  CL 108 100*  CO2 28 27  GLUCOSE 143* 139*  BUN 15 10  CREATININE 1.00 0.73  CALCIUM 8.7* 9.3    Studies/Results: No results found.  Assessment/Plan: Traumatic brain injury, cerebral contusions, basilar skull fractures: The patient is doing well clinically.  LOS: 3 days     Patrick [name] D 01/08/2016, 7:41 AM

## 2016-01-08 NOTE — Progress Notes (Signed)
1 Day Post-Op  Subjective: C/O sore throat  Objective: Vital signs in last 24 hours: Temp:  [98.1 F (36.7 C)-99.5 F (37.5 C)] 98.4 F (36.9 C) (10/03 0400) Pulse Rate:  [51-86] 53 (10/03 0600) Resp:  [8-26] 12 (10/03 0600) BP: (132-209)/(63-121) 165/91 (10/03 0600) SpO2:  [88 %-98 %] 97 % (10/03 0600) Last BM Date: 01/04/16  Intake/Output from previous day: 10/02 0701 - 10/03 0700 In: 2650 [I.V.:2550; IV Piggyback:100] Out: 2850 [Urine:2850] Intake/Output this shift: No intake/output data recorded.  General appearance: alert and cooperative Head: facial ecchymoses Resp: clear to auscultation bilaterally Cardio: S1, S2 normal GI: soft, NT, ND  Neuro: speech fluent, oriented, F/C  Lab Results: CBC   Recent Labs  01/07/16 0327 01/08/16 0345  WBC 15.3* 11.3*  HGB 12.1* 13.1  HCT 38.2* 39.3  PLT 159 190   BMET  Recent Labs  01/06/16 0312 01/08/16 0345  NA 144 133*  K 4.0 4.0  CL 108 100*  CO2 28 27  GLUCOSE 143* 139*  BUN 15 10  CREATININE 1.00 0.73  CALCIUM 8.7* 9.3   PT/INR  Recent Labs  01/06/16 0312  LABPROT 13.8  INR 1.06   ABG No results for input(s): PHART, HCO3 in the last 72 hours.  Invalid input(s): PCO2, PO2  Studies/Results: No results found.  Anti-infectives: Anti-infectives    Start     Dose/Rate Route Frequency Ordered Stop   01/07/16 0600  ceFAZolin (ANCEF) IVPB 2g/100 mL premix     2 g 200 mL/hr over 30 Minutes Intravenous On call to O.R. 01/06/16 1947 01/07/16 1155      Assessment/Plan: Fall TBI/B frontal ICC/basilar skull FXs including clivus, sphenoid, R pterygoid - per Dr. Arnoldo Morale, exam remains good. B max sinus/palate/B TMJ/B orbit FXs - S/P MMF by Dr. Marla Roe 10/2 ETOH abuse - CIWA Occipital condyle FX - collar for 2 weeks then flex ex per Dr. Arnoldo Morale FEN - advance to fulls, add chloraseptic for throat pain Dispo - TBI team, possible TF to floor soon  LOS: 3 days    Georganna Skeans, MD, MPH,  FACS Trauma: [phone] General Surgery: [phone]  10/3/2017Patient ID: Patrick [name], male   DOB: 28-Jul-1955, 60 y.o.   MRN: [id number]
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
## 2016-01-08 NOTE — Progress Notes (Signed)
Inpatient Rehabilitation  OT is recommending IP Rehab.  At this time we are recommending an IP rehab consult.  I will contact Ellan Lambert, RNCM to request order.  Please call if questions.  [place] Admissions Coordinator Cell [phone] Office [phone]

## 2016-01-09 LAB — BASIC METABOLIC PANEL
Anion gap: 9 (ref 5–15)
BUN: 13 mg/dL (ref 6–20)
CALCIUM: 9.7 mg/dL (ref 8.9–10.3)
CO2: 25 mmol/L (ref 22–32)
CREATININE: 0.71 mg/dL (ref 0.61–1.24)
Chloride: 99 mmol/L — ABNORMAL LOW (ref 101–111)
GFR calc non Af Amer: >60 mL/min (ref >60)
Glucose, Bld: 133 mg/dL — ABNORMAL HIGH (ref 65–99)
Potassium: 4.1 mmol/L (ref 3.5–5.1)
SODIUM: 133 mmol/L — AB (ref 135–145)

## 2016-01-09 MED ORDER — MORPHINE SULFATE (PF) 2 MG/ML IV SOLN
2.0000 mg | INTRAVENOUS | Status: DC | PRN
  Administered 2016-01-09 – 2016-01-10 (×2): 2 mg via INTRAVENOUS
  Filled 2016-01-09 (×2): qty 1

## 2016-01-09 NOTE — Progress Notes (Signed)
Patient ID: Patrick [name], male   DOB: 04/06/1956, 60 y.o.   MRN: [id number] Subjective:  The patient is alert and pleasant. He is in no apparent distress. He has no complaints. By report he had some discharge from his nose earlier today. Currently there is no drainage.  Objective: Vital signs in last 24 hours: Temp:  [97.4 F (36.3 C)-98.7 F (37.1 C)] 98.7 F (37.1 C) (10/04 1215) Pulse Rate:  [50-83] 57 (10/04 1215) Resp:  [8-20] 16 (10/04 1215) BP: (137-183)/(55-96) 163/78 (10/04 1215) SpO2:  [95 %-98 %] 97 % (10/04 1215)  Intake/Output from previous day: 10/03 0701 - 10/04 0700 In: 320 [I.V.:320] Out: 2100 [Urine:2100] Intake/Output this shift: No intake/output data recorded.  Physical exam the patient is alert and oriented 3. I don't see any evidence of active CSF otorrhea or rhinorrhea. His strength is normal. His speech is normal.  Lab Results:  Recent Labs  01/07/16 0327 01/08/16 0345  WBC 15.3* 11.3*  HGB 12.1* 13.1  HCT 38.2* 39.3  PLT 159 190   BMET  Recent Labs  01/08/16 0345 01/09/16 0422  NA 133* 133*  K 4.0 4.1  CL 100* 99*  CO2 27 25  GLUCOSE 139* 133*  BUN 10 13  CREATININE 0.73 0.71  CALCIUM 9.3 9.7    Studies/Results: No results found.  Assessment/Plan: Cerebral contusions, basilar skull fracture, cervical fracture: The patient is doing well clinically. His cervical fractures should heal fine and the collar. He doesn't seem to have an active CSF leak. I will sign off. Please have him follow-up with me in a couple weeks in the office after discharge. Please call if I can be of further assistance.  LOS: 4 days     Tykeshia Tourangeau D 01/09/2016, 5:09 PM
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
## 2016-01-09 NOTE — Progress Notes (Signed)
Noted PT recommendations for today and limited eval due to clear thin liquid from nose. I will follow up tomorrow. [id number]

## 2016-01-09 NOTE — Progress Notes (Signed)
Trauma Service Note  Subjective: Awake and alert and looking good.  Does not appear to be withdrawing.  Objective: Vital signs in last 24 hours: Temp:  [97.3 F (36.3 C)-98.8 F (37.1 C)] 97.7 F (36.5 C) (10/04 0400) Pulse Rate:  [50-83] 60 (10/04 0800) Resp:  [8-20] 16 (10/04 0800) BP: (134-180)/(64-97) 146/83 (10/04 0800) SpO2:  [95 %-98 %] 95 % (10/04 0800) Last BM Date: 01/04/16  Intake/Output from previous day: 10/03 0701 - 10/04 0700 In: 320 [I.V.:320] Out: 2100 [Urine:2100] Intake/Output this shift: No intake/output data recorded.  General: No distress  Lungs: Clear  Abd: Benign  Extremities: No changes.  No clinical signs or symptoms of DVT  Neuro: Intact  Lab Results: CBC   Recent Labs  01/07/16 0327 01/08/16 0345  WBC 15.3* 11.3*  HGB 12.1* 13.1  HCT 38.2* 39.3  PLT 159 190   BMET  Recent Labs  01/08/16 0345 01/09/16 0422  NA 133* 133*  K 4.0 4.1  CL 100* 99*  CO2 27 25  GLUCOSE 139* 133*  BUN 10 13  CREATININE 0.73 0.71  CALCIUM 9.3 9.7   PT/INR No results for input(s): LABPROT, INR in the last 72 hours. ABG No results for input(s): PHART, HCO3 in the last 72 hours.  Invalid input(s): PCO2, PO2  Studies/Results: No results found.  Anti-infectives: Anti-infectives    Start     Dose/Rate Route Frequency Ordered Stop   01/07/16 0600  ceFAZolin (ANCEF) IVPB 2g/100 mL premix     2 g 200 mL/hr over 30 Minutes Intravenous On call to O.R. 01/06/16 1947 01/07/16 1155      Assessment/Plan: s/p Procedure(s): OPEN REDUCTION INTERNAL FIXATION (ORIF) MANDIBULAR FRACTUTransfer to the floor.  Rehab consideration Transfer to the floor.  Rehab consideration.  LOS: 4 days   Kathryne Eriksson. Dahlia Bailiff, MD, FACS [phone] Trauma Surgeon 01/09/2016

## 2016-01-09 NOTE — Progress Notes (Signed)
I met with pt at bedside. We discussed the recommendation for an inpt rehab admission pending insurance approval. He states he does not feel he needs further rehab. Feels he is doing well and can go home. I noted that OT and PT differ on their recommendations for venue. I have requested PT assessment today before initiiating insurance authorization. I spoke with his wife by phone and she prefers one week of inpt rehab before he would go home. I will follow up after I hear from insurance. [phone]

## 2016-01-09 NOTE — Progress Notes (Signed)
PT notified RN of clear drainage from nose. Patient stood with PT and more began to drain from nose. Patient states he has had headache and headache has remained the same. PA notified, to place orders for collection. Report given to oncoming RN

## 2016-01-10 ENCOUNTER — Inpatient Hospital Stay (HOSPITAL_COMMUNITY): Payer: Managed Care, Other (non HMO)

## 2016-01-10 ENCOUNTER — Encounter (HOSPITAL_COMMUNITY): Payer: Self-pay | Admitting: Plastic Surgery

## 2016-01-10 DIAGNOSIS — W19XXXA Unspecified fall, initial encounter: Secondary | ICD-10-CM | POA: Diagnosis present

## 2016-01-10 DIAGNOSIS — F1010 Alcohol abuse, uncomplicated: Secondary | ICD-10-CM | POA: Diagnosis present

## 2016-01-10 DIAGNOSIS — S02109A Fracture of base of skull, unspecified side, initial encounter for closed fracture: Secondary | ICD-10-CM | POA: Diagnosis present

## 2016-01-10 DIAGNOSIS — S02113A Unspecified occipital condyle fracture, initial encounter for closed fracture: Secondary | ICD-10-CM | POA: Diagnosis present

## 2016-01-10 DIAGNOSIS — S0292XA Unspecified fracture of facial bones, initial encounter for closed fracture: Secondary | ICD-10-CM | POA: Diagnosis present

## 2016-01-10 MED ORDER — POLYETHYLENE GLYCOL 3350 17 G PO PACK
17.0000 g | PACK | Freq: Every day | ORAL | Status: DC
  Administered 2016-01-10 – 2016-01-22 (×10): 17 g via ORAL
  Filled 2016-01-10 (×10): qty 1

## 2016-01-10 MED ORDER — HYDROCODONE-ACETAMINOPHEN 7.5-325 MG/15ML PO SOLN
10.0000 mL | ORAL | Status: DC | PRN
  Administered 2016-01-10 (×3): 20 mL via ORAL
  Administered 2016-01-11: 15 mL via ORAL
  Administered 2016-01-11 (×3): 20 mL via ORAL
  Administered 2016-01-11: 15 mL via ORAL
  Administered 2016-01-12 (×2): 20 mL via ORAL
  Administered 2016-01-12: 10 mL via ORAL
  Administered 2016-01-12 – 2016-01-13 (×3): 20 mL via ORAL
  Administered 2016-01-13 (×2): 10 mL via ORAL
  Administered 2016-01-13 – 2016-01-18 (×22): 20 mL via ORAL
  Administered 2016-01-18: 15 mL via ORAL
  Administered 2016-01-18 – 2016-01-21 (×14): 20 mL via ORAL
  Administered 2016-01-21: 15 mL via ORAL
  Administered 2016-01-22 (×2): 20 mL via ORAL
  Administered 2016-01-22: 15 mL via ORAL
  Filled 2016-01-10 (×10): qty 30
  Filled 2016-01-10: qty 15
  Filled 2016-01-10 (×14): qty 30
  Filled 2016-01-10: qty 15
  Filled 2016-01-10 (×3): qty 30
  Filled 2016-01-10: qty 15
  Filled 2016-01-10 (×13): qty 30
  Filled 2016-01-10: qty 15
  Filled 2016-01-10 (×10): qty 30
  Filled 2016-01-10: qty 15
  Filled 2016-01-10 (×2): qty 30

## 2016-01-10 MED ORDER — DOCUSATE SODIUM 50 MG/5ML PO LIQD
100.0000 mg | Freq: Two times a day (BID) | ORAL | Status: DC
  Administered 2016-01-10 – 2016-01-22 (×23): 100 mg via ORAL
  Filled 2016-01-10 (×24): qty 10

## 2016-01-10 MED ORDER — MORPHINE SULFATE (PF) 2 MG/ML IV SOLN
2.0000 mg | INTRAVENOUS | Status: DC | PRN
  Administered 2016-01-10 – 2016-01-13 (×5): 2 mg via INTRAVENOUS
  Filled 2016-01-10 (×6): qty 1

## 2016-01-10 NOTE — Progress Notes (Signed)
OT Cancellation Note  Patient Details Name: Patrick [name] MRN: [id number] DOB: 04-Oct-1955   Cancelled Treatment:    Reason Eval/Treat Not Completed: Medical issues which prohibited therapy (positive for CSF leak). Will hold off on therapy today and check back tomorrow as time allows.  [name] M.S., OTR/L Pager: [phone]  01/10/2016, 3:34 PM

## 2016-01-10 NOTE — H&P (Signed)
Physical Medicine and Rehabilitation Admission H&P    Chief Complaint  Patient presents with  . TBI    HPI:   Patrick [name] is a 60 y.o. male who fell down 7 stairs and landed on his face.  Patient intoxicated with amnesia of events and ETOH level 285. He was evaluated in ED on 01/05/16 and  CT head done showing multiple facial fractures, basilar skull fracture and acute SAH/IPH along anterior frontal region. Dr. Arnoldo Morale evaluated patient and recommended monitoring as patient at risk for CSF bleed. CT cervical spine revealed minimally displaced right occipital condyle fracture and nondisplaced left occipital condyle fracture, endplate fracture C3 and C6 with gas in spinal canal.  To continue aspen collar for support.  He underwent maxillomandibular fixation by Dr. Marla Roe on 10/2 and to continue on liquid diet.   He developed severe HA as well as clear drainage from nares when up due to CSF leak. He was placed on bedrest without resolution of symptoms and CT head done 10/9 showing resolving IPH with significant left frontotemporal edema, layering fluid/complete opacification of L>R sphenoid sinus most likely area of CSF leak.  He was taken to OR that day for placement of lumbar drain by Dr. Arnoldo Morale.  Lumbar drain removed yesterday and therapy resumed. Mentation has improved but he continues to have severe HA.  Foley discontinued today.   Therapy ongoing and CIR recommended due to cognitive deficits, impulsivity, poor safety due to TBI.     Review of Systems  HENT: Negative for hearing loss.   Eyes: Positive for blurred vision (left due to irritation). Negative for double vision.  Respiratory: Negative for cough and sputum production.   Cardiovascular: Negative for chest pain.  Gastrointestinal: Positive for constipation. Negative for heartburn and nausea.  Genitourinary: Negative for frequency and urgency.  Musculoskeletal: Positive for myalgias and neck pain. Negative for back pain  and joint pain.  Skin: Negative for itching and rash.  Neurological: Positive for headaches ("killing me"). Negative for dizziness and tingling.  Psychiatric/Behavioral: Negative for depression. The patient is not nervous/anxious.       Past Medical History:  Diagnosis Date  . Coronary artery disease    moderate  . Hyperlipidemia   . TIA (transient ischemic attack)     Past Surgical History:  Procedure Laterality Date  . ORIF MANDIBULAR FRACTURE N/A 01/07/2016   Procedure: OPEN REDUCTION INTERNAL FIXATION (ORIF) MANDIBULAR FRACTURE;  Surgeon: Loel Lofty Dillingham, DO;  Location: [facility];  Service: Plastics;  Laterality: N/A;  . PLACEMENT OF LUMBAR DRAIN N/A 01/14/2016   Procedure: PLACEMENT OF LUMBAR DRAIN;  Surgeon: Newman Pies, MD;  Location: [facility];  Service: Neurosurgery;  Laterality: N/A;    Family History  Problem Relation Age of Onset  . Cancer Mother   . Heart disease Father     Social History:  Married. Works as a Hotel manager. Wife works part time.  He reports that he quit smoking about 21 years ago. His smoking use included Cigarettes. He has never used smokeless tobacco. He reports that he drinks alcohol--1-2 mixed drinks daily. He reports that he does not use drugs.    Allergies: No Known Allergies    Medications Prior to Admission  Medication Sig Dispense Refill  . aspirin 325 MG tablet Take 325 mg by mouth daily.    . Coenzyme Q10 (EQL COQ10) 300 MG CAPS Take 1 capsule by mouth daily.    . rosuvastatin (CRESTOR) 20 MG tablet Take 20 mg by  mouth daily.    . tamsulosin (FLOMAX) 0.4 MG CAPS capsule Take 0.4 mg by mouth.      Home: Home Living Family/patient expects to be discharged to:: Private residence Living Arrangements: Spouse/significant other (22yo daughter) Available Help at Discharge: Family, Available 24 hours/day (for about one week. wife works part time) Type of Home: House Home Access: Stairs to enter Technical brewer of Steps: [place]: Two level, Able to live on main level with bedroom/bathroom Bathroom Shower/Tub: Multimedia programmer: Standard Bathroom Accessibility: Yes Home Equipment: None  Lives With: Spouse, Daughter   Functional History: Prior Function Level of Independence: Independent Comments: goes hiking every wkend  Functional Status:  Mobility: Bed Mobility Overal bed mobility: Needs Assistance Bed Mobility: Supine to Sit, Sit to Supine Supine to sit: Supervision Sit to supine: Supervision General bed mobility comments: supervision for safety Transfers Overall transfer level: Needs assistance Equipment used: None Transfers: Sit to/from Stand Sit to Stand: Min guard Stand pivot transfers: Min guard General transfer comment: min guard for safety and stability, no physical assist required Ambulation/Gait Ambulation/Gait assistance: Min assist Ambulation Distance (Feet): 80 Feet Assistive device: None Gait Pattern/deviations: Step-through pattern, Decreased stride length, Drifts right/left, Narrow base of support (multiple balance checks noted) General Gait Details: pt unsteady but with no LOB; min guard for safety; cues required to decrease cervical rotation  Gait velocity: decreased Gait velocity interpretation: Below normal speed for age/gender Stairs: Yes Stairs assistance: Min guard Stair Management: One rail Right, Forwards, Alternating pattern Number of Stairs: 5 General stair comments: cues for safety    ADL: ADL Overall ADL's : Needs assistance/impaired Eating/Feeding: Set up Grooming: Wash/dry hands, Wash/dry face, Minimal assistance, Standing Upper Body Bathing: Minimal assitance, Min guard Lower Body Bathing: Maximal assistance, Sit to/from stand Lower Body Dressing: Min guard Lower Body Dressing Details (indicate cue type and reason): requires cues to sequence and once cued able to long sit and don bil socks Toilet Transfer: Minimal assistance, Regular  Toilet, Grab bars Toileting- Clothing Manipulation and Hygiene: Maximal assistance Toileting - Clothing Manipulation Details (indicate cue type and reason): pt with incontinence of bowel and no awareness. pt total (A) for peri care exiting the bed. pt states can I just have a pan for in the bed. pt educated OOB to bedside with staff. pt states' so you want me to just poop in the bed then?" pt educated on use of 3n1 with RN staff Functional mobility during ADLs: Minimal assistance General ADL Comments: pt demonstrates executive cogntiive deficits and lack of insight to injuries   Cognition: Cognition Overall Cognitive Status: Impaired/Different from baseline Arousal/Alertness: Awake/alert Orientation Level: Oriented X4 Attention: Focused, Sustained, Selective Focused Attention: Appears intact Sustained Attention: Appears intact Selective Attention: Impaired Selective Attention Impairment: Verbal basic, Functional basic Memory: Impaired Memory Impairment: Storage deficit, Retrieval deficit, Decreased short term memory Decreased Short Term Memory: Verbal basic, Functional basic Awareness: Impaired Awareness Impairment: Emergent impairment, Anticipatory impairment Problem Solving: Appears intact Executive Function: Reasoning Reasoning: Impaired Reasoning Impairment: Verbal complex [employer] Scales of Cognitive Functioning: Automatic/appropriate Cognition Arousal/Alertness: Awake/alert Behavior During Therapy: Impulsive Overall Cognitive Status: Impaired/Different from baseline Area of Impairment: Safety/judgement, Awareness, Memory, Attention, Problem solving Orientation Level: Situation (pt reports "they say" and not completely convinced) Current Attention Level: Focused Memory: Decreased short-term memory Following Commands: Follows one step commands consistently Safety/Judgement: Decreased awareness of safety, Decreased awareness of deficits Awareness: Intellectual Problem  Solving: Slow processing, Decreased initiation, Difficulty sequencing General Comments: Pt asking "can  I take this collar off. I dont need it" wife at bedside adn states "the doctor told you want?" Pt states "i dont know" wife states "you have to wear if for few more weeks. he told you that" Pt demonstrates lack of insight to injuries or need for (A).    Blood pressure (!) 149/113, pulse 81, temperature 98.5 F (36.9 C), temperature source Axillary, resp. rate 13, height 6' (1.829 m), weight 82.4 kg (181 lb 10.5 oz), SpO2 99 %. Physical Exam  Constitutional: He is oriented to person, place, and time. He appears well-developed and well-nourished.  HENT:  Head: Normocephalic and atraumatic.  Eyes: Conjunctivae and EOM are normal. Pupils are equal, round, and reactive to light. Right eye exhibits no discharge. Left eye exhibits no discharge.  Bilateral orbital ecchymosis resolving.   Neck:  Neck immobilized by collar  Respiratory: Effort normal and breath sounds normal. No respiratory distress. He has no wheezes.  GI: Soft. Bowel sounds are normal. He exhibits no distension. There is no tenderness.  Musculoskeletal: He exhibits no edema or tenderness.  Neurological: He is alert and oriented to person, place, and time. He displays normal reflexes.  Alert and appropriate. Left facial weakness and decrease left lid closure/brow movement. also with mild dysarthria due to dental bars. Decreased hearing left ear. He is able to follow commands without difficulty. Insight improved but continues to have poor safety awareness. UE strength grossly 5/5 with some pain inhibition proximally.l LE: 4+ hf and ke and 5/5 adf/pf. No sensory findings.   Skin: Skin is warm and dry. No erythema.  Psychiatric: He has a normal mood and affect. His behavior is normal. Thought content normal.      No results found for this or any previous visit (from the past 48 hour(s)). No results found.     Medical Problem List  and Plan: 1.  Functional, mobility, and cognitive deficits secondary to TBI/polytrauma after fall down stairs  -admit to inpatient rehab 2.  DVT Prophylaxis/Anticoagulation: Mechanical: Sequential compression devices, below knee Bilateral lower extremities and mobility  3. HA/Pain Management: Uncontrolled on current regimen--Fentanyl 50 mcg,  hydrocodone 20 mg 4-5 times daily  And prn IV dilaudid. Will add Topamax for pain control and attempt to wean of hydrocodone as pain control improves. D/c dilaudid.  4. Mood: LCSW to follow for evaluation and support.  5. Neuropsych: This patient is not fully capable of making decisions on his own behalf. 6. Skin/Wound Care: Routine pressure relief measures.  7. Fluids/Electrolytes/Nutrition: Monitor I/O. Continue liquid diet.  8. Leucocytosis: Recheck CBC in am. Monitor for signs of infection.  9. Constipation: Add Miralax bid today and repeat enema.  10. Urinary retention?: Check UA/UCS. Monitor voiding with PVR checks. Continue urecholine and flomax. 11. Hyponatremia: Recheck labs in am.  12. Impaired fating glucose: Will check Hgb A1c.  13. Facial fractures/TMJ fracture s/p maxillomandibular fixation: Liquid diet. HOB elevated as able and No nose blowing.   -likely left CN VII injury, ?VII injury also 14. HTN: Blood pressures elevated likely due to pain. Will monitor for now.   Post Admission Physician Evaluation: 1. Functional deficits secondary  to TBI/polytrauma. 2. Patient is admitted to receive collaborative, interdisciplinary care between the physiatrist, rehab nursing staff, and therapy team. 3. Patient's level of medical complexity and substantial therapy needs in context of that medical necessity cannot be provided at a lesser intensity of care such as a SNF. 4. Patient has experienced substantial functional loss from his/her baseline which was   documented above under the "Functional History" and "Functional Status" headings.  Judging by the  patient's diagnosis, physical exam, and functional history, the patient has potential for functional progress which will result in measurable gains while on inpatient rehab.  These gains will be of substantial and practical use upon discharge  in facilitating mobility and self-care at the household level. 5. Physiatrist will provide 24 hour management of medical needs as well as oversight of the therapy plan/treatment and provide guidance as appropriate regarding the interaction of the two. 6. 24 hour rehab nursing will assist with bladder management, bowel management, safety, skin/wound care, disease management, medication administration, pain management and patient education  and help integrate therapy concepts, techniques,education, etc. 7. PT will assess and treat for/with: Lower extremity strength, range of motion, stamina, balance, functional mobility, safety, adaptive techniques and equipment, NMR, vestibular rx/assessment, pain control, ego support.   Goals are: mod I to supervision. 8. OT will assess and treat for/with: ADL's, functional mobility, safety, upper extremity strength, adaptive techniques and equipment, NMR, vestibular rx, pain mgt, community reintegration.   Goals are: mod I to supervision. Therapy may proceed with showering this patient. 9. SLP will assess and treat for/with: cognition, education.  Goals are: mod I. 10. Case Management and Social Worker will assess and treat for psychological issues and discharge planning. 11. Team conference will be held weekly to assess progress toward goals and to determine barriers to discharge. 12. Patient will receive at least 3 hours of therapy per day at least 5 days per week. 13. ELOS: 7-10 days       14. Prognosis:  excellent     Meredith Staggers, MD, [facility] 01/22/2016  01/22/2016

## 2016-01-10 NOTE — Progress Notes (Signed)
Patient ID: Patrick [name], male   DOB: 12-Jul-1955, 60 y.o.   MRN: [id number]   LOS: 5 days   Subjective: C/o HA, stable in intensity. Continues to have some clear nasal discharge.   Objective: Vital signs in last 24 hours: Temp:  [97.8 F (36.6 C)-99 F (37.2 C)] 99 F (37.2 C) (10/05 0918) Pulse Rate:  [57-72] 71 (10/05 0918) Resp:  [13-20] 18 (10/05 0918) BP: (134-163)/(55-86) 144/76 (10/05 0918) SpO2:  [95 %-98 %] 97 % (10/05 0918) Last BM Date: 01/07/16   Physical Exam General appearance: alert and no distress Resp: clear to auscultation bilaterally Cardio: regular rate and rhythm GI: normal findings: bowel sounds normal and soft, non-tender   Assessment/Plan: Fall TBI/B frontal ICC/basilar skull FXs including clivus, sphenoid, R pterygoid - per Dr. Arnoldo Morale, exam remains good. Had some possible CSF spillage yesterday, likely continuing. Doesn't seem to be exacerbating his symptoms. B max sinus/palate/B TMJ/B orbit FXs - S/P MMF by Dr. Marla Roe 10/2 ETOH abuse - CIWA Occipital condyle FX - collar for 2 weeks then flex ex per Dr. Arnoldo Morale FEN - No issues VTE -- SCD's Dispo - TBI team, CIR when bed available    Lisette Abu, PA-C Pager: [phone] General Trauma PA Pager: [phone]  01/10/2016

## 2016-01-10 NOTE — Progress Notes (Signed)
Subjective: Patient reports leakage from nose  Objective: Vital signs in last 24 hours: Temp:  [97.8 F (36.6 C)-99 F (37.2 C)] 98.8 F (37.1 C) (10/05 1301) Pulse Rate:  [57-72] 67 (10/05 1301) Resp:  [16-20] 18 (10/05 1301) BP: (131-162)/(69-84) 131/79 (10/05 1301) SpO2:  [95 %-98 %] 95 % (10/05 1301)  Intake/Output from previous day: 10/04 0701 - 10/05 0700 In: 290 [P.O.:290] Out: 1100 [Urine:1100] Intake/Output this shift: Total I/O In: 500 [P.O.:500] Out: 200 [Urine:200]  Physical Exam: When patient sits up and leans forward, he leaks clear fluid from his right nostril, consistent with CSF.  He complains of mild headache.  He moves all extremities without apparent weakness and is awake and oriented.  Lab Results:  Recent Labs  01/08/16 0345  WBC 11.3*  HGB 13.1  HCT 39.3  PLT 190   BMET  Recent Labs  01/08/16 0345 01/09/16 0422  NA 133* 133*  K 4.0 4.1  CL 100* 99*  CO2 27 25  GLUCOSE 139* 133*  BUN 10 13  CREATININE 0.73 0.71  CALCIUM 9.3 9.7    Studies/Results: No results found.  Assessment/Plan: Probable CSF leak from extensive skull base fracture.  Hold on Rehab transfer.  Observe today.  Will need repeat head CT to assess for pneumocephalus and interval change in intracerebral contusions.  If continues to leak, he may require a lumbar drain to be placed, but I would not do this until [place] has significantly improved.  No antibiotics at present.      LOS: 5 days    Peggyann Shoals, MD 01/10/2016, 3:02 PM

## 2016-01-10 NOTE — Progress Notes (Signed)
I contacted Dr. Arnoldo Morale office and they will let Dr. Vertell Limber know of concern and will follow up. I discussed with Verdis Prime for Dr. Vertell Limber  by phone. [phone]

## 2016-01-10 NOTE — Progress Notes (Signed)
I have reviewed patient's CT scan.  There are bifrontal evolving hemorrhagic contusions along with left temporal lobe infarct.  He will likely need a trial of lumbar drainage to see if CSF leak will resolve.  If it does not, he will need a bifrontal craniotomy and repair of frontal floor.  I am reluctant to place a a drain now, given bifrontal injury and brain edema.  Will observe patient and, if no improvement in leakage, will plan of placing a drain in the relatively near future.

## 2016-01-10 NOTE — Progress Notes (Signed)
I met with pt at bedside and discussed with RN and PT. Pt has clear nasal drainage when sitting up and to stand from right nostril. I contacted Trauma PA, Legrand Como and he is aware and recommends I contact Dr. Arnoldo Morale. RN placed gauze to nose to catch drainage and pt sat up in bed with drainage noted to gauze. I have placed a call to Dr. Arnoldo Morale to discuss assessment at bedside. I will follow up after return call from Dr. Arnoldo Morale. [phone]

## 2016-01-10 NOTE — PMR Pre-admission (Signed)
PMR Admission Coordinator Pre-Admission Assessment  Patient: Patrick [name] is an 60 y.o., male MRN: [id number] DOB: 1955-10-27 Height: 6' (182.9 cm) Weight: 82.4 kg (181 lb 10.5 oz)              Insurance Information HMO:     PPO:      PCP:      IPA:      80/20:      OTHER: Point of care open access plan PRIMARY: [employer]  Policy#: [id number]      Subscriber: pt CM Name: Janey Genta for Thomasene Lot    Phone#: [phone]    Fax#: [phone] Pre-Cert#: [id number]  Approved for 7 days until 10/23 with updates 10/ 24 if CIR still needed F/u with Thomasene Lot CM phone [phone] fax; [phone] Benefits:  Phone #: [phone]    Name: 01/09/16 Eff. Date: 09/06/15     Deduct: $6350      Out of Pocket Max: $6350/includes deductible      Life Max: none CIR: 100% after deductible met      SNF: 100% after deductible 60 days Outpatient:   100%  Co-Pay: medical necessity Home Health: 100%      Co-Pay:  120 visits DME: 50%     Co-Pay: 50% Providers: in network  SECONDARY: none     Medicaid Application Date:       Case Manager:  Disability Application Date:       Case Worker:   Emergency Facilities manager Information    Name Relation Home Work Mobile   Seelig,Soraya Spouse   [phone]     Current Medical History  Patient Admitting Diagnosis: TBI  History of Present Illness: HPI:   Patrick [name] a 60 y.o.malewho fell down 7 stairs and landed on his face.  Patient intoxicated with amnesia of events and ETOH level 285. He was evaluated in ED on 01/05/16 and CT head done showing multiple facial fractures, basilar skull fracture and acute SAH/IPH along anterior frontal region. Dr. Arnoldo Morale evaluated patient and recommended monitoring as patient at risk for CSF bleed. CT cervical spine revealed minimally displaced right occipital condyle fracture and nondisplaced left occipital condyle fracture, endplate fracture C3 and C6 with gas in spinal canal.  To continue aspen collar  for support. He underwent maxillomandibular fixation by Dr. Marla Roe on 10/2 and to continue on liquid diet.   He developed severe HA as well as clear drainage from nares when up due to CSF leak. He was placed on bedrest without resolution of symptoms and CT head done 10/9 showing resolving IPH with significant left frontotemporal edema, layering fluid/complete opacification of L>R sphenoid sinus most likely area of CSF leak.  He was taken to OR that day for placement of lumbar drain by Dr. Arnoldo Morale.  Lumbar drain removed yesterday and therapy resumed. Mentation has improved but he continues to have severe HA.  Foley discontinued today.   Therapy ongoing and CIR recommended due to cognitive deficits, impulsivity, poor safety due to TBI.    Total: 0    Past Medical History  Past Medical History:  Diagnosis Date  . Coronary artery disease    moderate  . Hyperlipidemia   . TIA (transient ischemic attack)     Family History  family history includes Cancer in his mother; Heart disease in his father.  Prior Rehab/Hospitalizations:  Has the patient had major surgery during 100 days prior to admission? No  Current Medications   Current Facility-Administered Medications:  .  alum & mag hydroxide-simeth (MAALOX/MYLANTA) 200-200-20 MG/5ML suspension 30 mL, 30 mL, Oral, Q6H PRN, Newman Pies, MD .  bethanechol (URECHOLINE) tablet 25 mg, 25 mg, Oral, TID, Greer Pickerel, MD, 25 mg at 01/22/16 0951 .  diazepam (VALIUM) tablet 5 mg, 5 mg, Oral, Q6H PRN, Newman Pies, MD .  docusate (COLACE) 50 MG/5ML liquid 100 mg, 100 mg, Oral, BID, Lisette Abu, PA-C, 100 mg at 01/22/16 0951 .  feeding supplement (ENSURE ENLIVE) (ENSURE ENLIVE) liquid 237 mL, 237 mL, Oral, QID, Judeth Horn, MD, 237 mL at 01/22/16 1000 .  fentaNYL (DURAGESIC - dosed mcg/hr) patch 50 mcg, 50 mcg, Transdermal, Q72H, Lisette Abu, PA-C, 50 mcg at 01/20/16 1005 .  HYDROcodone-acetaminophen (HYCET) 7.5-325 mg/15 ml  solution 10-20 mL, 10-20 mL, Oral, Q4H PRN, Lisette Abu, PA-C, 15 mL at 01/22/16 1045 .  HYDROmorphone (DILAUDID) injection 0.5 mg, 0.5 mg, Intravenous, Q4H PRN, Lisette Abu, PA-C, 0.5 mg at 01/22/16 0012 .  multivitamin liquid 15 mL, 15 mL, Oral, Daily, Georganna Skeans, MD, 15 mL at 01/22/16 0951 .  ondansetron (ZOFRAN) tablet 4 mg, 4 mg, Oral, Q6H PRN **OR** ondansetron (ZOFRAN) injection 4 mg, 4 mg, Intravenous, Q6H PRN, Donnie Mesa, MD, 4 mg at 01/06/16 0958 .  phenol (CHLORASEPTIC) mouth spray 1 spray, 1 spray, Mouth/Throat, PRN, Georganna Skeans, MD .  polyethylene glycol (MIRALAX / GLYCOLAX) packet 17 g, 17 g, Oral, Daily, Lisette Abu, PA-C, 17 g at 01/22/16 0951 .  sodium chloride flush (NS) 0.9 % injection 3 mL, 3 mL, Intravenous, Q12H, Erline Levine, MD, 3 mL at 01/22/16 1000 .  sodium chloride flush (NS) 0.9 % injection 3 mL, 3 mL, Intravenous, PRN, Erline Levine, MD, 5 mL at 01/17/16 2158 .  sodium phosphate (FLEET) 7-19 GM/118ML enema 1 enema, 1 enema, Rectal, Once, Ralene Ok, MD, Stopped at 01/18/16 2300 .  tamsulosin (FLOMAX) capsule 0.4 mg, 0.4 mg, Oral, Daily, Judeth Horn, MD, 0.4 mg at 01/22/16 8144  Patients Current Diet: Diet full liquid Room service appropriate? Yes; Fluid consistency: Thin. Due to fixation surgically  Precautions / Restrictions Precautions Precautions: Fall, Cervical Precaution Comments: no nose blowing Cervical Brace: Hard collar, At all times Restrictions Weight Bearing Restrictions: No   Has the patient had 2 or more falls or a fall with injury in the past year?No  Prior Activity Level Community (5-7x/wk): Independent and working fulltime; Management consultant / [place] Devices/Equipment: None Home Equipment: None  Prior Device Use: Indicate devices/aids used by the patient prior to current illness, exacerbation or injury? None of the above  Prior Functional Level Prior Function Level of  Independence: Independent Comments: goes hiking every wkend  Self Care: Did the patient need help bathing, dressing, using the toilet or eating?  Independent  Indoor Mobility: Did the patient need assistance with walking from room to room (with or without device)? Independent  Stairs: Did the patient need assistance with internal or external stairs (with or without device)? Independent  Functional Cognition: Did the patient need help planning regular tasks such as shopping or remembering to take medications? Independent  Current Functional Level Cognition  Arousal/Alertness: Awake/alert Overall Cognitive Status: Impaired/Different from baseline Current Attention Level: Focused Orientation Level: Oriented X4 Following Commands: Follows one step commands consistently Safety/Judgement: Decreased awareness of safety, Decreased awareness of deficits General Comments: Pt asking "can I take this collar off. I dont need it" wife at bedside adn states "the doctor told you want?" Pt states "i  dont know" wife states "you have to wear if for few more weeks. he told you that" Pt demonstrates lack of insight to injuries or need for (A).  Attention: Focused, Sustained, Selective Focused Attention: Appears intact Sustained Attention: Appears intact Selective Attention: Impaired Selective Attention Impairment: Verbal basic, Functional basic Memory: Impaired Memory Impairment: Storage deficit, Retrieval deficit, Decreased short term memory Decreased Short Term Memory: Verbal basic, Functional basic Awareness: Impaired Awareness Impairment: Emergent impairment, Anticipatory impairment Problem Solving: Appears intact Executive Function: Reasoning Reasoning: Impaired Reasoning Impairment: Verbal complex [employer] Scales of Cognitive Functioning: Automatic/appropriate    Extremity Assessment (includes Sensation/Coordination)  Upper Extremity Assessment: Generalized weakness  Lower Extremity  Assessment: Generalized weakness RLE Deficits / Details: pt with knee flexion with transfers noted    ADLs  Overall ADL's : Needs assistance/impaired Eating/Feeding: Set up Grooming: Wash/dry hands, Wash/dry face, Minimal assistance, Standing Upper Body Bathing: Minimal assitance, Min guard Lower Body Bathing: Maximal assistance, Sit to/from stand Lower Body Dressing: Min guard Lower Body Dressing Details (indicate cue type and reason): requires cues to sequence and once cued able to long sit and don bil socks Toilet Transfer: Minimal assistance, Regular Toilet, Grab bars Toileting- Clothing Manipulation and Hygiene: Maximal assistance Toileting - Clothing Manipulation Details (indicate cue type and reason): pt with incontinence of bowel and no awareness. pt total (A) for peri care exiting the bed. pt states can I just have a pan for in the bed. pt educated OOB to bedside with staff. pt states' so you want me to just poop in the bed then?" pt educated on use of 3n1 with RN staff Functional mobility during ADLs: Minimal assistance General ADL Comments: pt demonstrates executive cogntiive deficits and lack of insight to injuries     Mobility  Overal bed mobility: Needs Assistance Bed Mobility: Supine to Sit, Sit to Supine Supine to sit: Supervision Sit to supine: Supervision General bed mobility comments: supervision for safety    Transfers  Overall transfer level: Needs assistance Equipment used: None Transfers: Sit to/from Stand Sit to Stand: Min guard Stand pivot transfers: Min guard General transfer comment: min guard for safety and stability, no physical assist required    Ambulation / Gait / Stairs / Wheelchair Mobility  Ambulation/Gait Ambulation/Gait assistance: Museum/gallery curator (Feet): 80 Feet Assistive device: None Gait Pattern/deviations: Step-through pattern, Decreased stride length, Drifts right/left, Narrow base of support (multiple balance checks  noted) General Gait Details: pt unsteady but with no LOB; min guard for safety; cues required to decrease cervical rotation  Gait velocity: decreased Gait velocity interpretation: Below normal speed for age/gender Stairs: Yes Stairs assistance: Min guard Stair Management: One rail Right, Forwards, Alternating pattern Number of Stairs: 5 General stair comments: cues for safety    Posture / Balance Balance Overall balance assessment: Needs assistance Sitting-balance support: Bilateral upper extremity supported, Feet supported Sitting balance-Leahy Scale: Fair Standing balance support: Bilateral upper extremity supported, During functional activity Standing balance-Leahy Scale: Fair Standing balance comment: min guard for safety Standardized Balance Assessment Standardized Balance Assessment : Berg Balance Test Berg Balance Test Sit to Stand: Able to stand  independently using hands Standing Unsupported: Able to stand safely 2 minutes Sitting with Back Unsupported but Feet Supported on Floor or Stool: Able to sit safely and securely 2 minutes Stand to Sit: Sits safely with minimal use of hands Standing Unsupported with Eyes Closed: Able to stand 10 seconds safely Standing Ubsupported with Feet Together: Able to place feet together independently and stand  for 1 minute with supervision From Standing, Reach Forward with Outstretched Arm: Can reach confidently >25 cm (10") From Standing Position, Turn to Look Behind Over each Shoulder: Turn sideways only but maintains balance Turn 360 Degrees: Able to turn 360 degrees safely but slowly    Special needs/care consideration BiPAP/CPAP  N/a CPM   N/a Continuous Drip IV   N/a Dialysis  N/a Life Vest   N/a Oxygen   N/a Special Bed   N/a Trach Size   N/a Wound Vac (area   N/a Skin   N/a Bowel mgmt: continent. Pt complains of constipation Bladder mgmt: continent Diabetic mgmt   N/a   Previous Home Environment Living Arrangements:  Spouse/significant other (75yo daughter)  Lives With: Spouse, Daughter Available Help at Discharge: Family, Available 24 hours/day (for about one week. wife works part time) Type of Home: [place]: Two level, Able to live on main level with bedroom/bathroom Home Access: Stairs to enter [employer] of Steps: 3 Bathroom Shower/Tub: Multimedia programmer: Standard Bathroom Accessibility: Yes How Accessible: Accessible via walker [place]: No  Discharge Living Setting Plans for Discharge Living Setting: Patient's home, Lives with (comment) (spouse and 54 yo daughter) Type of Home at Discharge: House Discharge Home Layout: Able to live on main level with bedroom/bathroom, Two level Discharge Home Access: Stairs to enter Entrance Stairs-Number of Steps: 3 Discharge Bathroom Shower/Tub: Walk-in shower Discharge Bathroom Toilet: Standard Discharge Bathroom Accessibility: Yes How Accessible: Accessible via walker Does the patient have any problems obtaining your medications?: No  Social/Family/Support Systems Patient Roles: Spouse, Parent, Other (Comment) (employee) Contact Information: Public affairs consultant, spouse Anticipated Caregiver: wife and 16 yo daughter Anticipated Caregiver's Contact Information: see above Ability/Limitations of Caregiver: wife works part time in [place] 2 - 3 days per week as interpreter Caregiver Availability: 24/7 (initially for about one week, daughter on Fall Break) Discharge Plan Discussed with Primary Caregiver: Yes Is Caregiver In Agreement with Plan?: Yes Does Caregiver/Family have Issues with Lodging/Transportation while Pt is in Rehab?: No  Goals/Additional Needs Patient/Family Goal for Rehab: Mod I to supervision with PT, OT, and SLP Expected length of stay: [facility] 7 days Pt/Family Agrees to Admission and willing to participate: Yes Program Orientation Provided & Reviewed with Pt/Caregiver Including Roles  & Responsibilities:  Yes  Decrease burden of Care through IP rehab admission: n/a  Possible need for SNF placement upon discharge: not anticipated  Patient Condition: This patient's medical and functional status has changed since the consult dated: 01/08/2016 in which the Rehabilitation Physician determined and documented that the patient's condition is appropriate for intensive rehabilitative care in an inpatient rehabilitation facility. See "History of Present Illness" (above) for medical update. Functional changes are: overall min assist with decreased safety awareness and impulsivity. Patient's medical and functional status update has been discussed with the Rehabilitation physician and patient remains appropriate for inpatient rehabilitation. Will admit to inpatient rehab today.   Preadmission Screen Completed By:  Cleatrice Burke, 01/22/2016 12:51 PM ______________________________________________________________________   Discussed status with Dr. Naaman Plummer on 01/22/2016 at  1243 and received telephone approval for admission today.  Admission Coordinator:  Cleatrice Burke, time 7622 Date 01/22/2016

## 2016-01-10 NOTE — Progress Notes (Signed)
Physical Therapy Treatment Patient Details Name: Patrick [name] MRN: [id number] DOB: 1955/07/13 Today's Date: 01/10/2016    History of Present Illness 60 year old white male who was intoxicated with an alcohol level of 285. He fell down some steps landing on his face. Workup included a head CT which demonstrated multiple facial and basilar skull fractures as well as a traumatic subarachnoid hemorrhage. There is fluid in the ethmoid and sphenoid sinuses. Pt is at risk for a CSF leak. CT includes palate and TMJ fractures with maxillary sinus    PT Comments    Session limited by continues rhinorrhea and concern for CSF leak. Pt with c/o headache with 8/10 pain. RN and MD aware. PT will continue to follow acutely.   Follow Up Recommendations  Supervision/Assistance - 24 hour;CIR     Equipment Recommendations  None recommended by PT    Recommendations for Other Services       Precautions / Restrictions Precautions Precautions: Fall;Cervical Precaution Comments: no nose blowing Required Braces or Orthoses: Cervical Brace Cervical Brace: Hard collar;At all times Restrictions Weight Bearing Restrictions: No    Mobility  Bed Mobility Overal bed mobility: Needs Assistance Bed Mobility: Supine to Sit;Sit to Supine     Supine to sit: Supervision Sit to supine: Supervision   General bed mobility comments: supervision for safety; pt impulsive  Transfers Overall transfer level: Needs assistance   Transfers: Sit to/from Stand;Stand Pivot Transfers Sit to Stand: Supervision Stand pivot transfers: Min guard       General transfer comment: cues for hand placement for safe descent  Ambulation/Gait             General Gait Details: deferred due to rhinorrhea with thin clear fluid, RN aware   Stairs            Wheelchair Mobility    Modified Rankin (Stroke Patients Only)       Balance                                    Cognition  Arousal/Alertness: Awake/alert Behavior During Therapy: Impulsive Overall Cognitive Status: Impaired/Different from baseline Area of Impairment: Safety/judgement;Awareness   Current Attention Level: Sustained Memory: Decreased recall of precautions   Safety/Judgement: Decreased awareness of deficits;Decreased awareness of safety Awareness: Emergent        Exercises      General Comments General comments (skin integrity, edema, etc.): pt required cues to not blow nose multiple times during session      Pertinent Vitals/Pain Pain Assessment: 0-10 Pain Score: 8  Pain Location: head Pain Descriptors / Indicators: Headache Pain Intervention(s): Limited activity within patient's tolerance;Monitored during session;RN gave pain meds during session    Home Living                      Prior Function            PT Goals (current goals can now be found in the care plan section) Acute Rehab PT Goals Patient Stated Goal: to go home Progress towards PT goals: Not progressing toward goals - comment    Frequency    Min 3X/week      PT Plan Current plan remains appropriate    Co-evaluation             End of Session Equipment Utilized During Treatment: Gait belt Activity Tolerance: Treatment limited secondary to medical complications (Comment) Patient left: in  bed;with call bell/phone within reach;with bed alarm set;with family/visitor present;with nursing/sitter in room     Time: [id number] PT Time Calculation (min) (ACUTE ONLY): 28 min  Charges:  $Therapeutic Activity: 23-37 mins                    G Codes:      Salina April, PTA Pager: [phone]   01/10/2016, 11:44 AM

## 2016-01-11 MED ORDER — FENTANYL 50 MCG/HR TD PT72
50.0000 ug | MEDICATED_PATCH | TRANSDERMAL | Status: DC
  Administered 2016-01-11 – 2016-01-20 (×4): 50 ug via TRANSDERMAL
  Filled 2016-01-11: qty 1
  Filled 2016-01-11 (×2): qty 2
  Filled 2016-01-11: qty 1

## 2016-01-11 NOTE — Progress Notes (Signed)
Speech Language Pathology Treatment: Cognitive-Linquistic  Patient Details Name: Raylon Thagard MRN: [id number] DOB: [birth year] Today's Date: 01/11/2016 Time: [id number] SLP Time Calculation (min) (ACUTE ONLY): 21 min  Assessment / Plan / Recommendation Clinical Impression  Pt able to identify deficits with minimal SLP verbal cueing required; stated STM is improving, but has periods of confusion and stated "my brain is still leaking"; Ox4, pain affecting attention as continuous headache present (internal distraction) and not resting d/t frequent staff visits during hospitalization; processing improved as pt able to participate in simple-complex conversation; daughter informed SLP she has noticed improvements in this area as well over last day or so; decreased organization noted with simple naming tasks, but pt showed awareness with this stating "this should be easier"; judgment/problem solving at verbal complex level about 60% with min-mod SLP verbal cueing required; session limited d/t pain level and pt request to continue at a later date d/t fatigue; con't with current POC   HPI HPI: The patient is a 60 year old white male who was intoxicated with an alcohol level of 285. He fell down some steps landing on his face. Workup included a head CT which demonstrated multiple facial and basilar skull fractures as well as a traumatic subarachnoid hemorrhage. There is fluid in the ethmoid and sphenoid sinuses. Pt is at risk for a CSF leak. CT includes palate and TMJ fractures with maxillary sinus, liquid only diet: no nose blowing, no chewing.      SLP Plan  Continue with current plan of care     Recommendations   Inpatient Rehab; ST 2x/wk for 1 week (in acute)                General recommendations: Rehab consult Follow up Recommendations: Inpatient Rehab Plan: Continue with current plan of care                       ADAMS,PAT, M.S., CCC-SLP 01/11/2016, 11:43 AM

## 2016-01-11 NOTE — Progress Notes (Signed)
OT Cancellation Note  Patient Details Name: Patrick [name] MRN: [id number] DOB: Nov 24, 1955   Cancelled Treatment:    Reason Eval/Treat Not Completed: Medical issues which prohibited therapy (continued concern for CSF leak). Will hold off on therapy at this time.   [name] M.S., OTR/L Pager: [phone]  01/11/2016, 8:29 AM

## 2016-01-11 NOTE — Progress Notes (Signed)
   01/11/16 2205  Clinical Encounter Type  Visited With Patient and family together  Visit Type Spiritual support  Referral From Nurse  Consult/Referral To Chaplain  Spiritual Encounters  Spiritual Needs Prayer;Emotional  Stress Factors  Patient Stress Factors Major life changes  Family Stress Factors Family relationships;Major life changes  Wife requesting prayer for Pt. Gave prayer of healing and comfort. Provided emotional support to wife as Pt goes through healing process.

## 2016-01-11 NOTE — Progress Notes (Addendum)
Subjective: Patient reports "My head still hurts. They've tried different things"  Objective: Vital signs in last 24 hours: Temp:  [98.2 F (36.8 C)-98.8 F (37.1 C)] 98.5 F (36.9 C) (10/06 0516) Pulse Rate:  [65-71] 71 (10/06 0516) Resp:  [18] 18 (10/06 0516) BP: (131-154)/(75-87) 136/79 (10/06 0516) SpO2:  [94 %-99 %] 94 % (10/06 0516)  Intake/Output from previous day: 10/05 0701 - 10/06 0700 In: 500 [P.O.:500] Out: 1125 [Urine:975; Blood:150] Intake/Output this shift: No intake/output data recorded.  Alert, conversant. Reports persistent headache. MAEW. PEARL. Reports persistent clear drainage right nare with upright posture; ~20deg now, without visible drainage.   Lab Results: No results for input(s): WBC, HGB, HCT, PLT in the last 72 hours. BMET  Recent Labs  01/09/16 0422  NA 133*  K 4.1  CL 99*  CO2 25  GLUCOSE 133*  BUN 13  CREATININE 0.71  CALCIUM 9.7    Studies/Results: Ct Head Wo Contrast  Addendum Date: 01/10/2016   ADDENDUM REPORT: 01/10/2016 17:15 ADDENDUM: Study discussed by telephone with Dr. Erline Levine on 01/10/2016 at 17:15 . Electronically Signed   By: Genevie Ann M.D.   On: 01/10/2016 17:15   Result Date: 01/10/2016 CLINICAL DATA:  60 year old male status post fall with skullbase fractures, pneumocephalus and intracranial hemorrhage. Initial encounter. EXAM: CT HEAD WITHOUT CONTRAST TECHNIQUE: Contiguous axial images were obtained from the base of the skull through the vertex without intravenous contrast. COMPARISON:  01/05/2016 and earlier. FINDINGS: Brain: Resolved pneumocephalus and pneumo ventricle. Small 3-4 mm thick right anterior convexity subdural hygroma. Moderate-sized left greater than right inferior frontal gyrus and left greater than right anterior temporal lobe hemorrhagic contusions with surrounding edema. Mild associated regional mass effect. Regressed subarachnoid hemorrhage, now only trace subarachnoid hemorrhage is visible. No residual  intraventricular hemorrhage identified. Chronic small cortically based infarct at the right frontal operculum is unchanged (series 2, image 16). Chronic lacunar infarct at the left cauda thalamic groove is unchanged. However, there is suggestion of new cytotoxic edema in the anterior superior left temporal lobe (series 5, image 46 and series 2, images 12-14. No other acute cortically based infarct identified. Basilar cisterns remain patent. Cisterna magna is patent. No ventriculomegaly. Vascular: No suspicious intracranial vascular hyperdensity. Skull: Orbital roof, floor of bilateral middle cranial fossa, left sphenoid roof and posterior wall, posterior right maxillary sinus and bilateral sphenoid wing fractures re- demonstrated. There are also subtle fractures across the left anterior auditory canal and left petrous apex (series 3, images 14 and 17). Sinuses/Orbits: Persistent fluid and hemorrhage in the bilateral sphenoid and left frontal sinuses. Smaller hemorrhage levels in the bilateral maxillary sinuses. Progressive opacification right greater than left tympanic cavities. Small hemorrhage or fluid levels in the bilateral mastoid air cells also mildly progressed. Other: No acute scalp or orbits soft tissue findings. IMPRESSION: 1. Evolution of Moderate-sized hemorrhagic contusions of the bilateral inferior frontal and anterior temporal lobes, worse on the left. Mild regional mass effect. 2. Suggestion of a superimposed evolving acute anterior superior left temporal lobe infarct, best seen on sagittal image 46. No significant mass effect. Stable small chronic infarcts at the left cauda thalamic groove and right frontal operculum. 3. Regressed subarachnoid hemorrhage. Resolved intraventricular hemorrhage with no ventriculomegaly. 4. Small 3-4 mm right anterior frontal subdural hygroma. 5. Very extensive bilateral skullbase fractures, including subtle fractures across the left IAC and petrous apex. Progressed  tympanic cavity and mastoid opacification. Electronically Signed: By: Genevie Ann M.D. On: 01/10/2016 17:11    Assessment/Plan:  LOS: 6 days  Discussed with pt Dr. Melven Sartorius recommendations: HOB ~30degrees with bathroom priviledges over weekend. Head CT Monday morning to evaluate resolving bifrontal contusions. If drainage stops, may need no intervention.  If drainage continues, Dr. Arnoldo Morale may consider lumbar drain trial in Neuro ICU, or bicoronal craniotomy for repair of skull floor.  Pt verbalizes understanding of Dr. Melven Sartorius concerns for proceeding with a drain sooner rather than later, due to presence of contusions and associated edema. Nurse informed of plan and CT order entered. Dr. Ronnald Ramp is aware and will follow over the weekend.    Verdis Prime 01/11/2016, 9:24 AM  I have reviewed studies with my partners, who share my concern about the degree of intracerebral edema due to resolving contusions and the associated potential risk to the patient of proceeding with lumbar drain placement at this time.  Will repeat Head CT Monday and give the patient a trial of bedrest to see if his CSF rhinorrhea resolves without further intervention.

## 2016-01-11 NOTE — Progress Notes (Signed)
I will follow up on Monday. [id number]

## 2016-01-11 NOTE — Progress Notes (Signed)
Patient ID: Patrick [name], male   DOB: [birth year], 60 y.o.   MRN: [id number]   LOS: 6 days   Subjective: C/o HA, oral pain meds not lasting long enough   Objective: Vital signs in last 24 hours: Temp:  [98.2 F (36.8 C)-99.1 F (37.3 C)] 99.1 F (37.3 C) (10/06 0946) Pulse Rate:  [65-71] 71 (10/06 0946) Resp:  [18] 18 (10/06 0946) BP: (131-154)/(75-87) 146/79 (10/06 0946) SpO2:  [94 %-99 %] 96 % (10/06 0946) Last BM Date: 01/07/16   Radiology Results CT HEAD WITHOUT CONTRAST  TECHNIQUE: Contiguous axial images were obtained from the base of the skull through the vertex without intravenous contrast.  COMPARISON:  01/05/2016 and earlier.  FINDINGS: Brain: Resolved pneumocephalus and pneumo ventricle. Small 3-4 mm thick right anterior convexity subdural hygroma. Moderate-sized left greater than right inferior frontal gyrus and left greater than right anterior temporal lobe hemorrhagic contusions with surrounding edema. Mild associated regional mass effect.  Regressed subarachnoid hemorrhage, now only trace subarachnoid hemorrhage is visible. No residual intraventricular hemorrhage identified.  Chronic small cortically based infarct at the right frontal operculum is unchanged (series 2, image 16). Chronic lacunar infarct at the left cauda thalamic groove is unchanged. However, there is suggestion of new cytotoxic edema in the anterior superior left temporal lobe (series 5, image 46 and series 2, images 12-14. No other acute cortically based infarct identified. Basilar cisterns remain patent. Cisterna magna is patent. No ventriculomegaly.  Vascular: No suspicious intracranial vascular hyperdensity.  Skull: Orbital roof, floor of bilateral middle cranial fossa, left sphenoid roof and posterior wall, posterior right maxillary sinus and bilateral sphenoid wing fractures re- demonstrated. There are also subtle fractures across the left anterior auditory canal  and left petrous apex (series 3, images 14 and 17).  Sinuses/Orbits: Persistent fluid and hemorrhage in the bilateral sphenoid and left frontal sinuses. Smaller hemorrhage levels in the bilateral maxillary sinuses. Progressive opacification right greater than left tympanic cavities. Small hemorrhage or fluid levels in the bilateral mastoid air cells also mildly progressed.  Other: No acute scalp or orbits soft tissue findings.  IMPRESSION: 1. Evolution of Moderate-sized hemorrhagic contusions of the bilateral inferior frontal and anterior temporal lobes, worse on the left. Mild regional mass effect. 2. Suggestion of a superimposed evolving acute anterior superior left temporal lobe infarct, best seen on sagittal image 46. No significant mass effect. Stable small chronic infarcts at the left cauda thalamic groove and right frontal operculum. 3. Regressed subarachnoid hemorrhage. Resolved intraventricular hemorrhage with no ventriculomegaly. 4. Small 3-4 mm right anterior frontal subdural hygroma. 5. Very extensive bilateral skullbase fractures, including subtle fractures across the left IAC and petrous apex. Progressed tympanic cavity and mastoid opacification.  Electronically Signed: By: Genevie Ann M.D. On: 01/10/2016 17:11   Physical Exam General appearance: alert and no distress Resp: clear to auscultation bilaterally Cardio: regular rate and rhythm GI: normal findings: bowel sounds normal and soft, non-tender   Assessment/Plan: Fall TBI/B frontal ICC/basilar skull FXs including clivus, sphenoid, R pterygoid- per Dr. Arnoldo Morale, exam remains good. Dr. Vertell Limber has recommended observation over weekend with f/u head CT Monday. If CSF leak continues will need lumbar drain vs skull base repair next week. B max sinus/palate/B TMJ/B orbit FXs- S/P MMF by Dr. Marla Roe 10/2 ETOH abuse- CIWA Occipital condyle FX- collar for 2 weeks then flex ex per Dr. Arnoldo Morale FEN- Will add  Duragesic patch VTE -- SCD's Dispo- TBI team, await NS evaluation on Monday    Lisette Abu, PA-C  Pager: [phone] General Trauma PA Pager: [phone]  01/11/2016

## 2016-01-12 NOTE — Progress Notes (Signed)
Trauma Service Note  Subjective: Wife unhappy that regualr food brought to patient and that nurse had patient sitting near upright earlier, no new pains  Objective: Vital signs in last 24 hours: Temp:  [98 F (36.7 C)-99.1 F (37.3 C)] 98.1 F (36.7 C) (10/07 0942) Pulse Rate:  [58-72] 72 (10/07 0942) Resp:  [18-20] 20 (10/07 0942) BP: (122-154)/(70-87) 130/70 (10/07 0942) SpO2:  [93 %-98 %] 93 % (10/07 0942) Last BM Date: 01/07/16  Intake/Output from previous day: 10/06 0701 - 10/07 0700 In: 900 [P.O.:900] Out: 1400 [Urine:1400] Intake/Output this shift: No intake/output data recorded.  General: NAd  Lungs: CTAB  Abd: soft, NT, ND  Extremities: no edema  Neuro: AOx4, 5/5 strength all extremities  Lab Results: CBC  No results for input(s): WBC, HGB, HCT, PLT in the last 72 hours. BMET No results for input(s): NA, K, CL, CO2, GLUCOSE, BUN, CREATININE, CALCIUM in the last 72 hours. PT/INR No results for input(s): LABPROT, INR in the last 72 hours. ABG No results for input(s): PHART, HCO3 in the last 72 hours.  Invalid input(s): PCO2, PO2  Studies/Results: No results found.  Anti-infectives: Anti-infectives    Start     Dose/Rate Route Frequency Ordered Stop   01/07/16 0600  ceFAZolin (ANCEF) IVPB 2g/100 mL premix     2 g 200 mL/hr over 30 Minutes Intravenous On call to O.R. 01/06/16 1947 01/07/16 1155      Medications Scheduled Meds: . docusate  100 mg Oral BID  . fentaNYL  50 mcg Transdermal Q72H  . multivitamin  15 mL Oral Daily  . polyethylene glycol  17 g Oral Daily   Continuous Infusions:  PRN Meds:.HYDROcodone-acetaminophen, morphine injection, ondansetron **OR** ondansetron (ZOFRAN) IV, phenol  Assessment/Plan: s/p Procedure(s): OPEN REDUCTION INTERNAL FIXATION (ORIF) MANDIBULAR FRACTURE CSF leak for basal skull fracture -continue sitting HOB per NSG to allow CSF leak to close and decrease edema -continue full liquids -pain control   LOS: 7 days   [place] Surgeon [phone] Surgery 01/12/2016

## 2016-01-12 NOTE — Anesthesia Postprocedure Evaluation (Signed)
Anesthesia Post Note  Patient: Damarri Vantuyl Strycharz  Procedure(s) Performed: Procedure(s) (LRB): OPEN REDUCTION INTERNAL FIXATION (ORIF) MANDIBULAR FRACTURE (N/A)  Patient location during evaluation: PACU Anesthesia Type: General Level of consciousness: awake Pain management: pain level controlled Respiratory status: spontaneous breathing Cardiovascular status: stable Postop Assessment: no signs of nausea or vomiting Anesthetic complications: no    Last Vitals:  Vitals:   01/12/16 1407 01/12/16 1705  BP: 126/64 136/70  Pulse: 73 72  Resp: 20 20  Temp: 36.7 C 36.9 C    Last Pain:  Vitals:   01/12/16 1705  TempSrc: Axillary  PainSc:                  Elizeth Weinrich

## 2016-01-12 NOTE — Progress Notes (Signed)
Over night patient struggled with not being able to move, wife in room helped to reinforce keeping the head of bed below 10 degrees.

## 2016-01-12 NOTE — Plan of Care (Signed)
Problem: Activity: Goal: Risk for activity intolerance will decrease Outcome: Progressing Pt has remained in a laying position all day  Problem: Fluid Volume: Goal: Ability to maintain a balanced intake and output will improve Outcome: Progressing Tolerating full liquids well  Problem: Bowel/Gastric: Goal: Will not experience complications related to bowel motility Outcome: Progressing Laxatives administered as ordered

## 2016-01-12 NOTE — Progress Notes (Signed)
Pt reported that pt was coughing a lot. Went to check on pt and spent some time in the room with the pt and did not hear any coughing, pt cleared his throat once during the 15 minutes that the  reporting caregiver was in the room. Will monitor pt condition

## 2016-01-12 NOTE — Progress Notes (Addendum)
Patient ID: Patrick [name], male   DOB: Aug 15, 1955, 60 y.o.   MRN: [id number] Vital signs are stable Motor function is intact The wife has numerous concerns regarding the nature of the leak and whether it will seal or not and what the plans are and how long length of stay will be I answered these questions as best possible indicating that for now it's best to keep him relaxed area his head of bed can be up slightly. Should however avoid straining. CT is planned for Monday morning. Without provocation he has not been leaking Hopefully the area of the leak is adequately inflamed and will seal over on its own.

## 2016-01-12 NOTE — Progress Notes (Signed)
No significant changes in condition noted through the day, no nasal drainage noted either. Pt has maintained a 20 degree incline or less while laying in bed. Pt's wife at bedside all day.

## 2016-01-13 MED ORDER — SODIUM CHLORIDE 0.9% FLUSH
3.0000 mL | Freq: Two times a day (BID) | INTRAVENOUS | Status: DC
  Administered 2016-01-13 – 2016-01-19 (×8): 3 mL via INTRAVENOUS
  Administered 2016-01-19: 10 mL via INTRAVENOUS
  Administered 2016-01-20 – 2016-01-22 (×5): 3 mL via INTRAVENOUS

## 2016-01-13 MED ORDER — DIPHENHYDRAMINE HCL 50 MG/ML IJ SOLN
12.5000 mg | Freq: Once | INTRAMUSCULAR | Status: AC
  Administered 2016-01-13: 12.5 mg via INTRAVENOUS
  Filled 2016-01-13: qty 1

## 2016-01-13 MED ORDER — SODIUM CHLORIDE 0.9% FLUSH
3.0000 mL | INTRAVENOUS | Status: DC | PRN
  Administered 2016-01-17: 5 mL via INTRAVENOUS
  Filled 2016-01-13: qty 3

## 2016-01-13 MED ORDER — DIPHENHYDRAMINE HCL 25 MG PO CAPS
25.0000 mg | ORAL_CAPSULE | Freq: Once | ORAL | Status: AC
Start: 2016-01-13 — End: 2016-01-13

## 2016-01-13 NOTE — Progress Notes (Signed)
Patient ID: Patrick [name], male   DOB: March 10, 1956, 60 y.o.   MRN: [id number] Awake and alert. No headache. No postnasal drip. However when he turns in bed or stands up he has drainage from the right nare. Awake and alert and pleasant and conversant. Moving all extremities. Continue observation for now. Dr. Arnoldo Morale back tomorrow.

## 2016-01-13 NOTE — Progress Notes (Signed)
Pt has rash blotchy red to upper and middle back complained of itching.  Notified MD received order for benadryl 12.5 IV push. Monitoring ongoing.

## 2016-01-13 NOTE — Progress Notes (Signed)
Trauma Service Note  Subjective: New rash over back  Objective: Vital signs in last 24 hours: Temp:  [98 F (36.7 C)-98.8 F (37.1 C)] 98.5 F (36.9 C) (10/08 0548) Pulse Rate:  [71-73] 72 (10/08 0548) Resp:  [18-20] 18 (10/08 0548) BP: (126-141)/(64-74) 139/69 (10/08 0548) SpO2:  [93 %-96 %] 93 % (10/08 0548) Last BM Date: 01/07/16  Intake/Output from previous day: 10/07 0701 - 10/08 0700 In: -  Out: 200 [Urine:200] Intake/Output this shift: Total I/O In: -  Out: 450 [Blood:450]  General: NAD  Lungs: CTAB  Abd: soft, NT, ND  Extremities: no edema, moves all extremities  Neuro: GCS 15, AOx2, confused on events and amnestic to most plans of care  Derm: macular papular rash over back  Lab Results: CBC  No results for input(s): WBC, HGB, HCT, PLT in the last 72 hours. BMET No results for input(s): NA, K, CL, CO2, GLUCOSE, BUN, CREATININE, CALCIUM in the last 72 hours. PT/INR No results for input(s): LABPROT, INR in the last 72 hours. ABG No results for input(s): PHART, HCO3 in the last 72 hours.  Invalid input(s): PCO2, PO2  Studies/Results: No results found.  Anti-infectives: Anti-infectives    Start     Dose/Rate Route Frequency Ordered Stop   01/07/16 0600  ceFAZolin (ANCEF) IVPB 2g/100 mL premix     2 g 200 mL/hr over 30 Minutes Intravenous On call to O.R. 01/06/16 1947 01/07/16 1155      Medications Scheduled Meds: . docusate  100 mg Oral BID  . fentaNYL  50 mcg Transdermal Q72H  . multivitamin  15 mL Oral Daily  . polyethylene glycol  17 g Oral Daily  . sodium chloride flush  3 mL Intravenous Q12H   Continuous Infusions:  PRN Meds:.HYDROcodone-acetaminophen, morphine injection, ondansetron **OR** ondansetron (ZOFRAN) IV, phenol, sodium chloride flush  Assessment/Plan: s/p Procedure(s): OPEN REDUCTION INTERNAL FIXATION (ORIF) MANDIBULAR FRACTURE CSF leak basal skull fracture -continue sitting HOB per NSG to allow CSF leak to close and  decrease edema -continue full liquids -pain control -rash - likely reaction to soap or medication. All medications reviewed, no antibiotics on currently, stopped morphine, no other high allergen/reaction medications active in mar   LOS: 8 days   [place] Surgeon [phone] Surgery 01/13/2016

## 2016-01-13 NOTE — Progress Notes (Signed)
Raised, reddened and pruritic rash noted posterior upper torso from waistline to posterior neck.  Call placed to trauma PA.  Will order hypoallergenic cart for patient.

## 2016-01-14 ENCOUNTER — Inpatient Hospital Stay (HOSPITAL_COMMUNITY): Payer: Managed Care, Other (non HMO) | Admitting: Anesthesiology

## 2016-01-14 ENCOUNTER — Inpatient Hospital Stay (HOSPITAL_COMMUNITY): Payer: Managed Care, Other (non HMO)

## 2016-01-14 ENCOUNTER — Encounter (HOSPITAL_COMMUNITY): Admission: EM | Disposition: A | Discharge status: Rehabilitation Facility | Payer: Self-pay | Source: Home / Self Care

## 2016-01-14 HISTORY — PX: PLACEMENT OF LUMBAR DRAIN: SHX6028

## 2016-01-14 LAB — CBC WITH DIFFERENTIAL/PLATELET
BASOS PCT: 0 %
Basophils Absolute: 0 10*3/uL (ref 0.0–0.1)
Eosinophils Absolute: 0.2 10*3/uL (ref 0.0–0.7)
Eosinophils Relative: 1 %
HEMATOCRIT: 41.8 % (ref 39.0–52.0)
Hemoglobin: 14.2 g/dL (ref 13.0–17.0)
LYMPHS PCT: 10 %
Lymphs Abs: 1.4 10*3/uL (ref 0.7–4.0)
MCH: 30.3 pg (ref 26.0–34.0)
MCHC: 34 g/dL (ref 30.0–36.0)
MCV: 89.1 fL (ref 78.0–100.0)
MONO ABS: 1.4 10*3/uL — AB (ref 0.1–1.0)
MONOS PCT: 10 %
NEUTROS ABS: 11.4 10*3/uL — AB (ref 1.7–7.7)
Neutrophils Relative %: 79 %
Platelets: 320 10*3/uL (ref 150–400)
RBC: 4.69 MIL/uL (ref 4.22–5.81)
RDW: 12.7 % (ref 11.5–15.5)
WBC: 14.5 10*3/uL — ABNORMAL HIGH (ref 4.0–10.5)

## 2016-01-14 LAB — PROTIME-INR
INR: 1.02
PROTHROMBIN TIME: 13.4 s (ref 11.4–15.2)

## 2016-01-14 LAB — BASIC METABOLIC PANEL
Anion gap: 10 (ref 5–15)
BUN: 12 mg/dL (ref 6–20)
CALCIUM: 9.9 mg/dL (ref 8.9–10.3)
CO2: 29 mmol/L (ref 22–32)
CREATININE: 0.76 mg/dL (ref 0.61–1.24)
Chloride: 95 mmol/L — ABNORMAL LOW (ref 101–111)
GFR calc Af Amer: >60 mL/min (ref >60)
GFR calc non Af Amer: >60 mL/min (ref >60)
GLUCOSE: 138 mg/dL — AB (ref 65–99)
Potassium: 4.5 mmol/L (ref 3.5–5.1)
Sodium: 134 mmol/L — ABNORMAL LOW (ref 135–145)

## 2016-01-14 SURGERY — PLACEMENT OF LUMBAR DRAIN
Anesthesia: Monitor Anesthesia Care | Site: Spine Lumbar

## 2016-01-14 MED ORDER — POTASSIUM CHLORIDE IN NACL 40-0.9 MEQ/L-% IV SOLN
INTRAVENOUS | Status: DC
  Filled 2016-01-14 (×2): qty 1000

## 2016-01-14 MED ORDER — DIAZEPAM 5 MG PO TABS: 5.0000 mg | ORAL_TABLET | Freq: Four times a day (QID) | ORAL | Status: DC | PRN

## 2016-01-14 MED ORDER — FENTANYL CITRATE (PF) 100 MCG/2ML IJ SOLN
INTRAMUSCULAR | Status: DC | PRN
  Administered 2016-01-14: 50 ug via INTRAVENOUS
  Administered 2016-01-14 (×3): 25 ug via INTRAVENOUS

## 2016-01-14 MED ORDER — OXYCODONE HCL 5 MG PO TABS: 5.0000 mg | ORAL_TABLET | Freq: Once | ORAL | Status: DC | PRN

## 2016-01-14 MED ORDER — ONDANSETRON HCL 4 MG/2ML IJ SOLN: 4.0000 mg | Freq: Four times a day (QID) | INTRAMUSCULAR | Status: DC | PRN

## 2016-01-14 MED ORDER — BACITRACIN ZINC 500 UNIT/GM EX OINT
TOPICAL_OINTMENT | CUTANEOUS | Status: AC
  Filled 2016-01-14: qty 28.35

## 2016-01-14 MED ORDER — LACTATED RINGERS IV SOLN
INTRAVENOUS | Status: DC | PRN
  Administered 2016-01-14: 14:00:00 via INTRAVENOUS

## 2016-01-14 MED ORDER — FENTANYL CITRATE (PF) 100 MCG/2ML IJ SOLN
INTRAMUSCULAR | Status: AC
  Filled 2016-01-14: qty 2

## 2016-01-14 MED ORDER — CEFAZOLIN SODIUM-DEXTROSE 2-3 GM-% IV SOLR
INTRAVENOUS | Status: DC | PRN
  Administered 2016-01-14: 2 g via INTRAVENOUS

## 2016-01-14 MED ORDER — FENTANYL CITRATE (PF) 100 MCG/2ML IJ SOLN
INTRAMUSCULAR | Status: AC
  Filled 2016-01-14: qty 4

## 2016-01-14 MED ORDER — BACITRACIN ZINC 500 UNIT/GM EX OINT
TOPICAL_OINTMENT | CUTANEOUS | Status: DC | PRN
  Administered 2016-01-14: 1 via TOPICAL

## 2016-01-14 MED ORDER — PROPOFOL 10 MG/ML IV BOLUS
INTRAVENOUS | Status: AC
  Filled 2016-01-14: qty 20

## 2016-01-14 MED ORDER — CEFAZOLIN SODIUM-DEXTROSE 2-4 GM/100ML-% IV SOLN
INTRAVENOUS | Status: AC
  Filled 2016-01-14: qty 100

## 2016-01-14 MED ORDER — ALUM & MAG HYDROXIDE-SIMETH 200-200-20 MG/5ML PO SUSP: 30.0000 mL | Freq: Four times a day (QID) | ORAL | Status: DC | PRN

## 2016-01-14 MED ORDER — LACTATED RINGERS IV SOLN
INTRAVENOUS | Status: DC
  Administered 2016-01-14 – 2016-01-16 (×6): via INTRAVENOUS

## 2016-01-14 MED ORDER — CEFAZOLIN SODIUM-DEXTROSE 2-4 GM/100ML-% IV SOLN
2.0000 g | Freq: Three times a day (TID) | INTRAVENOUS | Status: AC
  Administered 2016-01-14 – 2016-01-15 (×2): 2 g via INTRAVENOUS
  Filled 2016-01-14 (×2): qty 100

## 2016-01-14 MED ORDER — MIDAZOLAM HCL 5 MG/5ML IJ SOLN
INTRAMUSCULAR | Status: DC | PRN
  Administered 2016-01-14: 2 mg via INTRAVENOUS

## 2016-01-14 MED ORDER — BUPIVACAINE-EPINEPHRINE (PF) 0.5% -1:200000 IJ SOLN
INTRAMUSCULAR | Status: AC
  Filled 2016-01-14: qty 30

## 2016-01-14 MED ORDER — BUPIVACAINE-EPINEPHRINE 0.5% -1:200000 IJ SOLN
INTRAMUSCULAR | Status: DC | PRN
  Administered 2016-01-14: 5 mL

## 2016-01-14 MED ORDER — ONDANSETRON HCL 4 MG/2ML IJ SOLN
INTRAMUSCULAR | Status: DC | PRN
  Administered 2016-01-14: 4 mg via INTRAVENOUS

## 2016-01-14 MED ORDER — MIDAZOLAM HCL 2 MG/2ML IJ SOLN
INTRAMUSCULAR | Status: AC
  Filled 2016-01-14: qty 2

## 2016-01-14 MED ORDER — OXYCODONE HCL 5 MG/5ML PO SOLN: 5.0000 mg | Freq: Once | ORAL | Status: DC | PRN

## 2016-01-14 MED ORDER — FENTANYL CITRATE (PF) 100 MCG/2ML IJ SOLN
25.0000 ug | INTRAMUSCULAR | Status: DC | PRN
  Administered 2016-01-14: 50 ug via INTRAVENOUS

## 2016-01-14 MED ORDER — HYDROMORPHONE HCL 1 MG/ML IJ SOLN
0.5000 mg | INTRAMUSCULAR | Status: DC | PRN
  Administered 2016-01-14 – 2016-01-22 (×25): 0.5 mg via INTRAVENOUS
  Filled 2016-01-14 (×25): qty 1

## 2016-01-14 SURGICAL SUPPLY — 58 items
BLADE CLIPPER SURG (BLADE) IMPLANT
BLADE SURG 10 STRL SS (BLADE) IMPLANT
BLADE SURG 15 STRL LF DISP TIS (BLADE) IMPLANT
BLADE SURG 15 STRL SS (BLADE)
BOOT SUTURE AID YELLOW STND (SUTURE) IMPLANT
CANISTER SUCT 3000ML PPV (MISCELLANEOUS) IMPLANT
CATH VENTRIC 35X38 W/TROCAR LG (CATHETERS) IMPLANT
CORDS BIPOLAR (ELECTRODE) IMPLANT
COVER BACK TABLE 60X90IN (DRAPES) ×2 IMPLANT
COVER MAYO STAND STRL (DRAPES) ×2 IMPLANT
DRAIN SUBARACHNOID (WOUND CARE) ×2 IMPLANT
DRAPE C-ARM 42X72 X-RAY (DRAPES) ×4 IMPLANT
DRAPE INCISE IOBAN 66X45 STRL (DRAPES) ×2 IMPLANT
DRAPE LAPAROTOMY T 98X78 PEDS (DRAPES) ×2 IMPLANT
DRAPE POUCH INSTRU U-SHP 10X18 (DRAPES) IMPLANT
DRAPE SURG 17X23 STRL (DRAPES) ×2 IMPLANT
DRSG OPSITE POSTOP 4X6 (GAUZE/BANDAGES/DRESSINGS) ×2 IMPLANT
ELECT CAUTERY BLADE 6.4 (BLADE) IMPLANT
ELECT REM PT RETURN 9FT ADLT (ELECTROSURGICAL)
ELECTRODE REM PT RTRN 9FT ADLT (ELECTROSURGICAL) IMPLANT
GAUZE SPONGE 4X4 16PLY XRAY LF (GAUZE/BANDAGES/DRESSINGS) ×2 IMPLANT
GLOVE BIO SURGEON STRL SZ 6.5 (GLOVE) ×4 IMPLANT
GLOVE BIO SURGEON STRL SZ8 (GLOVE) ×2 IMPLANT
GLOVE BIO SURGEON STRL SZ8.5 (GLOVE) ×2 IMPLANT
GLOVE BIOGEL PI IND STRL 7.0 (GLOVE) ×3 IMPLANT
GLOVE BIOGEL PI INDICATOR 7.0 (GLOVE) ×3
GLOVE EXAM NITRILE LRG STRL (GLOVE) IMPLANT
GLOVE EXAM NITRILE XL STR (GLOVE) IMPLANT
GLOVE EXAM NITRILE XS STR PU (GLOVE) IMPLANT
GOWN STRL REUS W/ TWL LRG LVL3 (GOWN DISPOSABLE) ×1 IMPLANT
GOWN STRL REUS W/ TWL XL LVL3 (GOWN DISPOSABLE) ×1 IMPLANT
GOWN STRL REUS W/TWL LRG LVL3 (GOWN DISPOSABLE) ×1
GOWN STRL REUS W/TWL XL LVL3 (GOWN DISPOSABLE) ×1
KIT BASIN OR (CUSTOM PROCEDURE TRAY) IMPLANT
KIT DRAIN CSF ACCUDRAIN (MISCELLANEOUS) ×2 IMPLANT
KIT ROOM TURNOVER OR (KITS) ×2 IMPLANT
NEEDLE HYPO 22GX1.5 SAFETY (NEEDLE) ×2 IMPLANT
NS IRRIG 1000ML POUR BTL (IV SOLUTION) IMPLANT
PACK EENT II TURBAN DRAPE (CUSTOM PROCEDURE TRAY) IMPLANT
PAD ARMBOARD 7.5X6 YLW CONV (MISCELLANEOUS) ×4 IMPLANT
PATTIES SURGICAL .5 X.5 (GAUZE/BANDAGES/DRESSINGS) IMPLANT
PATTIES SURGICAL 1X1 (DISPOSABLE) IMPLANT
PENCIL BUTTON HOLSTER BLD 10FT (ELECTRODE) IMPLANT
STAPLER SKIN PROX WIDE 3.9 (STAPLE) IMPLANT
SUT BONE WAX W31G (SUTURE) IMPLANT
SUT ETHILON 2 0 FS 18 (SUTURE) IMPLANT
SUT SILK 2 0 TIES 10X30 (SUTURE) ×2 IMPLANT
SUT VIC AB 2-0 CP2 18 (SUTURE) ×2 IMPLANT
SUT VIC AB 3-0 SH 8-18 (SUTURE) IMPLANT
SYR 3ML LL SCALE MARK (SYRINGE) ×2 IMPLANT
SYR BULB 3OZ (MISCELLANEOUS) IMPLANT
SYR CONTROL 10ML LL (SYRINGE) ×2 IMPLANT
TOWEL OR 17X24 6PK STRL BLUE (TOWEL DISPOSABLE) ×2 IMPLANT
TOWEL OR 17X26 10 PK STRL BLUE (TOWEL DISPOSABLE) IMPLANT
TRAY FOLEY W/METER SILVER 16FR (SET/KITS/TRAYS/PACK) IMPLANT
TUBE CONNECTING 12X1/4 (SUCTIONS) IMPLANT
UNDERPAD 30X30 (UNDERPADS AND DIAPERS) IMPLANT
WATER STERILE IRR 1000ML POUR (IV SOLUTION) IMPLANT

## 2016-01-14 NOTE — Progress Notes (Signed)
Report called to short stay.  Pt transported via bed.   Cori Razor, RN

## 2016-01-14 NOTE — Anesthesia Preprocedure Evaluation (Signed)
Anesthesia Evaluation  Patient identified by MRN, date of birth, ID band Patient awake    Reviewed: Allergy & Precautions, NPO status , Patient's Chart, lab work & pertinent test results  Airway    Neck ROM: limited  Mouth opening: Limited Mouth Opening Comment: C-collar in place.  Jaw wired shut.   Dental   Pulmonary former smoker,    breath sounds clear to auscultation       Cardiovascular + CAD   Rhythm:regular Rate:Normal     Neuro/Psych S/p fall down stairs.  Basilar skull fxs.  CSF rhinorrhea.  Mandible fx s/p repair.  Jaw wired shut. TIA   GI/Hepatic   Endo/Other    Renal/GU      Musculoskeletal   Abdominal   Peds  Hematology   Anesthesia Other Findings   Reproductive/Obstetrics                             Anesthesia Physical Anesthesia Plan  ASA: III  Anesthesia Plan: MAC   Post-op Pain Management:    Induction: Intravenous  Airway Management Planned: Simple Face Mask  Additional Equipment:   Intra-op Plan:   Post-operative Plan:   Informed Consent: I have reviewed the patients History and Physical, chart, labs and discussed the procedure including the risks, benefits and alternatives for the proposed anesthesia with the patient or authorized representative who has indicated his/her understanding and acceptance.     Plan Discussed with: CRNA, Anesthesiologist and Surgeon  Anesthesia Plan Comments:         Anesthesia Quick Evaluation

## 2016-01-14 NOTE — Progress Notes (Signed)
Noted plans for drain. I will follow. [id number]

## 2016-01-14 NOTE — Op Note (Signed)
Brief history: The patient is a 60 year old white male who took a fall about 9 days ago offering a mandible fracture, facial fractures, basilar skull fractures, cerebral contusions, etc. He has developed a persistent CSF fistula. I discussed the various treatment options with the patient and his wife. We discussed placement of a lumbar drain. I described the procedure as well as its risks, benefits, and alternatives. I answered all the patient's, as well as, questions. He has decided to proceed with placement of the drain.  Preoperative diagnosis: Basilar skull fracture, CSF fistula/rhinorrhea  Postop diagnosis: The same  Procedure: Placement of lumbar drain  Surgeon: Dr. Earle Gell  Assistant: None  Anesthesia: Mac  Estimated blood loss: Minimal  Specimens: None  Description of procedure: The patient was brought to the operating room by the anesthesia team. Mac anesthesia was induced. The patient remained in the lateral position with a left side down. The patient's lumbosacral region was then prepared with DuraPrep. Sterile drapes were applied. I then injected the insertion site with Marcaine solution. Under fluoroscopic guidance I inserted a Touhy needle into the L4-5 interspace. I removed the stylette. There was flow of bloody cerebral spinal fluid. I then inserted the lumbar drain through the needle. He confirmed its good position with fluoroscopy. I then removed the guidewire. There was flow of this cerebral spinal fluid out through the distal end of the lumbar drain. I connected the drain to the drainage system. I secured it with a silk tie. A sterile dressing was applied. The drapes were removed. The patient was transported to the post anesthesia care unit. By report all sponge, instrument, and needle counts were correct at the end this case.

## 2016-01-14 NOTE — Progress Notes (Signed)
Physical Therapy Treatment Note  Patient demonstrated balance, strength, and cognitive impairments limiting safe mobility. Current plan remains appropriate.    01/14/16 1336  PT Visit Information  Last PT Received On 01/14/16  Assistance Needed +1  History of Present Illness 60 year old white male who was intoxicated with an alcohol level of 285. He fell down some steps landing on his face. Workup included a head CT which demonstrated multiple facial and basilar skull fractures as well as a traumatic subarachnoid hemorrhage. There is fluid in the ethmoid and sphenoid sinuses. Pt is at risk for a CSF leak. CT includes palate and TMJ fractures with maxillary sinus  Subjective Data  Patient Stated Goal to go home  Precautions  Precautions Fall;Cervical  Precaution Comments no nose blowing  Required Braces or Orthoses Cervical Brace  Cervical Brace Hard collar;At all times  Restrictions  Weight Bearing Restrictions No  Pain Assessment  Pain Assessment Faces  Faces Pain Scale 6  Pain Location head, bilat hips and back with movement  Pain Descriptors / Indicators Aching;Grimacing;Guarding;Sore  Pain Intervention(s) Limited activity within patient's tolerance;Monitored during session;Premedicated before session;Repositioned  Cognition  Arousal/Alertness Awake/alert  Behavior During Therapy Impulsive  Overall Cognitive Status Impaired/Different from baseline  Area of Impairment Safety/judgement;Awareness  Current Attention Level Selective  Memory Decreased recall of precautions;Decreased short-term memory  Safety/Judgement Decreased awareness of deficits;Decreased awareness of safety  Awareness Emergent  Rancho Levels of Cognitive Functioning  Rancho Los Amigos Scales of Cognitive Functioning VII  Bed Mobility  Overal bed mobility Needs Assistance  Bed Mobility Supine to Sit;Sit to Supine  Supine to sit Supervision  Sit to supine Supervision  General bed mobility comments supervision  for safety  Transfers  Overall transfer level Needs assistance  Equipment used None  Transfers Sit to/from Stand  Sit to Stand Supervision  General transfer comment supervision for safety due to pt is a little unsteady upon standing but with no LOB  Ambulation/Gait  Ambulation/Gait assistance Min guard  Ambulation Distance (Feet) 300 Feet  Assistive device None  Gait Pattern/deviations Step-through pattern;Decreased stride length;Drifts right/left  General Gait Details pt unsteady but with no LOB; min guard for safety; cues required to decrease cervical rotation   Gait velocity decreased  Stairs Yes  Stairs assistance Min guard  Stair Management One rail Right;Forwards;Alternating pattern  Number of Stairs 5  General stair comments cues for safety  Balance  Overall balance assessment Needs assistance  Sitting balance-Leahy Scale Good  Standing balance-Leahy Scale Fair  General Comments  General comments (skin integrity, edema, etc.) pt okay to ambulate/mobilize per RN; wife present for session; increased rhinorrhea with activity  Exercises  Exercises General Lower Extremity  General Exercises - Lower Extremity  Ankle Circles/Pumps AROM;Both;20 reps;Seated  Long Arc Quad AROM;Both;20 reps;Seated  Hip Flexion/Marching AROM;Both;20 reps;Seated  PT - End of Session  Equipment Utilized During Treatment Gait belt  Activity Tolerance Patient tolerated treatment well  Patient left in bed;with call bell/phone within reach;with bed alarm set;with family/visitor present;with nursing/sitter in room  Nurse Communication Mobility status  PT - Assessment/Plan  PT Plan Current plan remains appropriate  PT Frequency (ACUTE ONLY) Min 3X/week  Follow Up Recommendations Supervision/Assistance - 24 hour;CIR  PT equipment None recommended by PT  PT Goal Progression  Progress towards PT goals Progressing toward goals  PT Time Calculation  PT Start Time (ACUTE ONLY) 1228  PT Stop Time (ACUTE  ONLY) 1259  PT Time Calculation (min) (ACUTE ONLY) 31 min  PT General Charges  $$  ACUTE PT VISIT 1 Procedure  PT Treatments  $Gait Training 8-22 mins  $Therapeutic Exercise 8-22 mins  Earney Navy, PTA Pager: [phone]

## 2016-01-14 NOTE — Anesthesia Postprocedure Evaluation (Signed)
Anesthesia Post Note  Patient: Patrick [name]  Procedure(s) Performed: Procedure(s) (LRB): PLACEMENT OF LUMBAR DRAIN (N/A)  Patient location during evaluation: PACU Anesthesia Type: MAC Level of consciousness: awake and alert Pain management: pain level controlled Vital Signs Assessment: post-procedure vital signs reviewed and stable Respiratory status: spontaneous breathing, nonlabored ventilation, respiratory function stable and patient connected to nasal cannula oxygen Cardiovascular status: stable and blood pressure returned to baseline Anesthetic complications: no    Last Vitals:  Vitals:   01/14/16 1450 01/14/16 1503  BP: (!) 162/87   Pulse: (!) 59 61  Resp:  15  Temp:  36.5 C    Last Pain:  Vitals:   01/14/16 1503  TempSrc:   PainSc: [place]

## 2016-01-14 NOTE — Progress Notes (Signed)
Patient ID: Patrick [name], male   DOB: 22-Dec-1955, 60 y.o.   MRN: [id number]   LOS: 9 days   Subjective: No new c/o, still having clear rhinorrhea   Objective: Vital signs in last 24 hours: Temp:  [97.8 F (36.6 C)-98.3 F (36.8 C)] 98.1 F (36.7 C) (10/09 0920) Pulse Rate:  [64-75] 75 (10/09 0920) Resp:  [18-20] 20 (10/09 0920) BP: (131-145)/(71-80) 133/76 (10/09 0920) SpO2:  [93 %-99 %] 94 % (10/09 0920) Last BM Date: 01/07/16   Laboratory  CBC  Recent Labs  01/14/16 1011  WBC 14.5*  HGB 14.2  HCT 41.8  PLT 320    Radiology Results CT HEAD WITHOUT CONTRAST  TECHNIQUE: Contiguous axial images were obtained from the base of the skull through the vertex without intravenous contrast.  COMPARISON:  Multiple priors, most recent 01/10/2016.  FINDINGS: Brain: Continued involution of multiple parenchymal hemorrhages, and subarachnoid hemorrhage. Largest areas of edema remain associated with contusions in the LEFT frontal and LEFT temporal lobes. No hydrocephalus. No midline shift. No new hemorrhages.  Vascular: No hyperdense vessel.  Skull: Basilar skull fractures redemonstrated. Pneumocephalus has resolved.  Sinuses/Orbits: Complete opacification of LEFT division sphenoid sinus, partial opacification with air-fluid level RIGHT division sphenoid sinus. Layering fluid both maxillary sinuses, greater on the RIGHT. BILATERAL anterior and posterior ethmoid fluid. LEFT frontal sinus fluid. No orbital findings.  Other: Layering fluid both mastoids.  IMPRESSION: Resolving intraparenchymal hemorrhages with significant residual LEFT frontotemporal edema. No new findings. No hydrocephalus.  Layering fluid/ complete opacification involving the sphenoid sinus, greater on the LEFT. Given the basilar skull fractures, involving the posterior and superior wall of the LEFT sphenoid (images 23-29, series 3), this would appear to be the most likely area  of cerebrospinal fluid leak.  If further investigation desired, and/or conservative measures fail, CT cisternography could be performed with low-dose intrathecal Isovue-M-300, for more definitive evaluation.   Electronically Signed   By: Staci Righter M.D.   On: 01/14/2016 08:02   Physical Exam General appearance: alert and no distress Resp: clear to auscultation bilaterally Cardio: regular rate and rhythm GI: normal findings: bowel sounds normal and soft, non-tender   Assessment/Plan: Fall TBI/B frontal ICC/basilar skull FXs including clivus, sphenoid, R pterygoid- per Dr. Arnoldo Morale, exam remains good. Planning for lumbar drain today by IR B max sinus/palate/B TMJ/B orbit FXs- S/P MMF by Dr. Marla Roe 10/2 ETOH abuse- CIWA Occipital condyle FX- collar for 2 weeks then flex ex per Dr. Arnoldo Morale FEN- No issues VTE-- SCD's Dispo- Lumbar drain    Patrick Abu, PA-C Pager: [phone] General Trauma PA Pager: [phone]  01/14/2016

## 2016-01-14 NOTE — Progress Notes (Signed)
SLP Cancellation Note  Patient Details Name: Patrick [name] MRN: [id number] DOB: January 02, 1956   Cancelled treatment:       Reason Eval/Treat Not Completed: Patient at procedure or test/unavailable. Will attempt to follow up as able.   Gunnar Fusi, M.A., CCC-SLP [phone]  [place] 01/14/2016, 2:23 PM

## 2016-01-14 NOTE — Transfer of Care (Signed)
Immediate Anesthesia Transfer of Care Note  Patient: Patrick [name]  Procedure(s) Performed: Procedure(s): PLACEMENT OF LUMBAR DRAIN (N/A)  Patient Location: PACU  Anesthesia Type:MAC  Level of Consciousness: awake, alert , oriented and patient cooperative  Airway & Oxygen Therapy: Patient Spontanous Breathing and Patient connected to nasal cannula oxygen  Post-op Assessment: Report given to RN, Post -op Vital signs reviewed and stable and Patient moving all extremities X 4  Post vital signs: Reviewed and stable  Last Vitals:  Vitals:   01/14/16 0920 01/14/16 1423  BP: 133/76 (!) 156/74  Pulse: 75 67  Resp: 20 (P) 14  Temp: 36.7 C 36.5 C    Last Pain:  Vitals:   01/14/16 1215  TempSrc:   PainSc: 4       Patients Stated Pain Goal: 2 ([id number])  Complications: No apparent anesthesia complications

## 2016-01-14 NOTE — Anesthesia Procedure Notes (Signed)
Procedure Name: MAC Date/Time: 01/14/2016 1:40 PM Performed by: Mervyn Gay Pre-anesthesia Checklist: Patient identified, Patient being monitored, Timeout performed, Emergency Drugs available and Suction available Patient Re-evaluated:Patient Re-evaluated prior to inductionOxygen Delivery Method: Simple face mask Preoxygenation: Pre-oxygenation with 100% oxygen Number of attempts: 1 Placement Confirmation: positive ETCO2 Dental Injury: Teeth and Oropharynx as per pre-operative assessment

## 2016-01-14 NOTE — Progress Notes (Signed)
Patient ID: Patrick [name], male   DOB: 29-Dec-1955, 60 y.o.   MRN: [id number] Subjective:  The patient is alert and pleasant. He is in no apparent distress. His wife is at the bedside. He has had some intermittent CSF rhinorrhea.  Objective: Vital signs in last 24 hours: Temp:  [97.8 F (36.6 C)-98.6 F (37 C)] 98.1 F (36.7 C) (10/09 0920) Pulse Rate:  [64-75] 75 (10/09 0920) Resp:  [18-20] 20 (10/09 0920) BP: (131-145)/(71-86) 133/76 (10/09 0920) SpO2:  [93 %-99 %] 94 % (10/09 0920)  Intake/Output from previous day: 10/08 0701 - 10/09 0700 In: 360 [P.O.:360] Out: 750 [Urine:750] Intake/Output this shift: No intake/output data recorded.  Physical exam the patient is alert and pleasant. He is oriented 3. His pupils are equal. He is moving all 4 extremities well. He has a partial left facial nerve palsy. When he leans forward he has CSF rhinorrhea.  I have reviewed the patient's head CT performed today at [facility]. He has multiple basilar skull fractures. He has fluid in the maxillary and sphenoid sinuses. This cerebral contusions or evolving as expected. There is mild mass effect, no significant midline shift.  Lab Results: No results for input(s): WBC, HGB, HCT, PLT in the last 72 hours. BMET No results for input(s): NA, K, CL, CO2, GLUCOSE, BUN, CREATININE, CALCIUM in the last 72 hours.  Studies/Results: Ct Head Wo Contrast  Result Date: 01/14/2016 CLINICAL DATA:  Continued surveillance head trauma. Patient reportedly is leaking spinal fluid. EXAM: CT HEAD WITHOUT CONTRAST TECHNIQUE: Contiguous axial images were obtained from the base of the skull through the vertex without intravenous contrast. COMPARISON:  Multiple priors, most recent 01/10/2016. FINDINGS: Brain: Continued involution of multiple parenchymal hemorrhages, and subarachnoid hemorrhage. Largest areas of edema remain associated with contusions in the LEFT frontal and LEFT temporal lobes. No  hydrocephalus. No midline shift. No new hemorrhages. Vascular: No hyperdense vessel. Skull: Basilar skull fractures redemonstrated. Pneumocephalus has resolved. Sinuses/Orbits: Complete opacification of LEFT division sphenoid sinus, partial opacification with air-fluid level RIGHT division sphenoid sinus. Layering fluid both maxillary sinuses, greater on the RIGHT. BILATERAL anterior and posterior ethmoid fluid. LEFT frontal sinus fluid. No orbital findings. Other: Layering fluid both mastoids. IMPRESSION: Resolving intraparenchymal hemorrhages with significant residual LEFT frontotemporal edema. No new findings. No hydrocephalus. Layering fluid/ complete opacification involving the sphenoid sinus, greater on the LEFT. Given the basilar skull fractures, involving the posterior and superior wall of the LEFT sphenoid (images 23-29, series 3), this would appear to be the most likely area of cerebrospinal fluid leak. If further investigation desired, and/or conservative measures fail, CT cisternography could be performed with low-dose intrathecal Isovue-M-300, for more definitive evaluation. Electronically Signed   By: Staci Righter M.D.   On: 01/14/2016 08:02    Assessment/Plan: Basilar skull fracture, CSF fistula: I have discussed the situation with the patient and his wife. We have discussed the various treatment options which include continued observation, placement of lumbar drain, and a craniotomy for repair of CSF fistula. His cerebral contusions are resolving and there is not significant mass effect so I think placement of lumbar drain at this point would be with acceptable risk. I described the procedure to them. We discussed the risks including infection, nerve injury, hemorrhage, etc. I have answered all their questions. He has decided to proceed with placement of the drain. He had breakfast at approximately 0700.  Cerebral contusions: Resolving  Cervical spine fractures: These should heal fine in the  collar.  Facial  nerve palsy: Hopefully this will resolve  LOS: 9 days     Henli Hey D 01/14/2016, 9:59 AM

## 2016-01-14 NOTE — Progress Notes (Addendum)
Subjective: Patient reports "My headache hasn't really changed"  Objective: Vital signs in last 24 hours: Temp:  [97.8 F (36.6 C)-98.6 F (37 C)] 98 F (36.7 C) (10/09 0546) Pulse Rate:  [64-72] 70 (10/09 0546) Resp:  [18-20] 20 (10/09 0546) BP: (131-145)/(71-86) 131/71 (10/09 0546) SpO2:  [93 %-99 %] 94 % (10/09 0546)  Intake/Output from previous day: 10/08 0701 - 10/09 0700 In: 360 [P.O.:360] Out: 750 [Urine:750] Intake/Output this shift: No intake/output data recorded.  Alert, conversant. Wife present, noting pt's recall is often unreliable. He is currently A&Ox3, and notes recurrent clear fluid drainage from right nare when sat up in CT this AM. No drainage currently with HOB at ~10degrees.   Lab Results: No results for input(s): WBC, HGB, HCT, PLT in the last 72 hours. BMET No results for input(s): NA, K, CL, CO2, GLUCOSE, BUN, CREATININE, CALCIUM in the last 72 hours.  Studies/Results: Ct Head Wo Contrast  Result Date: 01/14/2016 CLINICAL DATA:  Continued surveillance head trauma. Patient reportedly is leaking spinal fluid. EXAM: CT HEAD WITHOUT CONTRAST TECHNIQUE: Contiguous axial images were obtained from the base of the skull through the vertex without intravenous contrast. COMPARISON:  Multiple priors, most recent 01/10/2016. FINDINGS: Brain: Continued involution of multiple parenchymal hemorrhages, and subarachnoid hemorrhage. Largest areas of edema remain associated with contusions in the LEFT frontal and LEFT temporal lobes. No hydrocephalus. No midline shift. No new hemorrhages. Vascular: No hyperdense vessel. Skull: Basilar skull fractures redemonstrated. Pneumocephalus has resolved. Sinuses/Orbits: Complete opacification of LEFT division sphenoid sinus, partial opacification with air-fluid level RIGHT division sphenoid sinus. Layering fluid both maxillary sinuses, greater on the RIGHT. BILATERAL anterior and posterior ethmoid fluid. LEFT frontal sinus fluid. No orbital  findings. Other: Layering fluid both mastoids. IMPRESSION: Resolving intraparenchymal hemorrhages with significant residual LEFT frontotemporal edema. No new findings. No hydrocephalus. Layering fluid/ complete opacification involving the sphenoid sinus, greater on the LEFT. Given the basilar skull fractures, involving the posterior and superior wall of the LEFT sphenoid (images 23-29, series 3), this would appear to be the most likely area of cerebrospinal fluid leak. If further investigation desired, and/or conservative measures fail, CT cisternography could be performed with low-dose intrathecal Isovue-M-300, for more definitive evaluation. Electronically Signed   By: Staci Righter M.D.   On: 01/14/2016 08:02    Assessment/Plan:   LOS: 9 days  CT this am will be reviewed by DrJenkins. Pt and wife verbalize understanding.    Verdis Prime 01/14/2016, 8:06 AM   Dr. Arnoldo Morale to render definitive care.  Cerebral edema along with contusions apparent on repeat head CT scan.

## 2016-01-14 NOTE — Progress Notes (Signed)
RN rounds on pt to administered AM medications and as RN reviewed meds, pt verbalizes that he doesn't want to the stool softener. Rn rec'ed that he takes it due to current pain meds regimen and s/e. Patient's wife at bedside appears upset and wants to know "why are you asking him the questions when you should read about his report". Wife then proceed to ask RN questions r/t pt's POC and wants RN to explains pt's "reports". Both pt and family both talking at the same times during and RN is trying to understand both party wishes but unable due to pt's wife emotional distress. RN attempted to clarify some misunderstanding but pt's wife at this point become hysterical and wants RN to be excuse. Charge RN to adminsiters meds at this time.   Ave Filter, RN

## 2016-01-15 ENCOUNTER — Encounter (HOSPITAL_COMMUNITY): Payer: Self-pay | Admitting: Neurosurgery

## 2016-01-15 NOTE — Progress Notes (Signed)
1 Day Post-Op  Subjective: C/O HA, denies rhinorrhea  Objective: Vital signs in last 24 hours: Temp:  [97.4 F (36.3 C)-98.4 F (36.9 C)] 98.2 F (36.8 C) (10/10 0800) Pulse Rate:  [58-89] 58 (10/10 0900) Resp:  [8-18] 11 (10/10 0900) BP: (130-195)/(69-126) 178/76 (10/10 0900) SpO2:  [93 %-97 %] 96 % (10/10 0900) Last BM Date: 01/07/16  Intake/Output from previous day: 10/09 0701 - 10/10 0700 In: 2300 [P.O.:560; I.V.:1540; IV Piggyback:200] Out: 1735 [Urine:1450; Drains:282; Blood:3] Intake/Output this shift: Total I/O In: -  Out: 737 [Urine:700; Drains:37]  General appearance: cooperative Head: facial ecchymoses evolvong Neck: collar Resp: clear to auscultation bilaterally Cardio: regular rate and rhythm GI: soft, NT Neuro: oriented, F/C, speech fluent  Lab Results: CBC   Recent Labs  01/14/16 1011  WBC 14.5*  HGB 14.2  HCT 41.8  PLT 320   BMET  Recent Labs  01/14/16 1011  NA 134*  K 4.5  CL 95*  CO2 29  GLUCOSE 138*  BUN 12  CREATININE 0.76  CALCIUM 9.9   PT/INR  Recent Labs  01/14/16 1011  LABPROT 13.4  INR 1.02   ABG No results for input(s): PHART, HCO3 in the last 72 hours.  Invalid input(s): PCO2, PO2  Studies/Results: Ct Head Wo Contrast  Result Date: 01/14/2016 CLINICAL DATA:  Continued surveillance head trauma. Patient reportedly is leaking spinal fluid. EXAM: CT HEAD WITHOUT CONTRAST TECHNIQUE: Contiguous axial images were obtained from the base of the skull through the vertex without intravenous contrast. COMPARISON:  Multiple priors, most recent 01/10/2016. FINDINGS: Brain: Continued involution of multiple parenchymal hemorrhages, and subarachnoid hemorrhage. Largest areas of edema remain associated with contusions in the LEFT frontal and LEFT temporal lobes. No hydrocephalus. No midline shift. No new hemorrhages. Vascular: No hyperdense vessel. Skull: Basilar skull fractures redemonstrated. Pneumocephalus has resolved.  Sinuses/Orbits: Complete opacification of LEFT division sphenoid sinus, partial opacification with air-fluid level RIGHT division sphenoid sinus. Layering fluid both maxillary sinuses, greater on the RIGHT. BILATERAL anterior and posterior ethmoid fluid. LEFT frontal sinus fluid. No orbital findings. Other: Layering fluid both mastoids. IMPRESSION: Resolving intraparenchymal hemorrhages with significant residual LEFT frontotemporal edema. No new findings. No hydrocephalus. Layering fluid/ complete opacification involving the sphenoid sinus, greater on the LEFT. Given the basilar skull fractures, involving the posterior and superior wall of the LEFT sphenoid (images 23-29, series 3), this would appear to be the most likely area of cerebrospinal fluid leak. If further investigation desired, and/or conservative measures fail, CT cisternography could be performed with low-dose intrathecal Isovue-M-300, for more definitive evaluation. Electronically Signed   By: Staci Righter M.D.   On: 01/14/2016 08:02   Dg Lumbar Spine 1 View  Result Date: 01/14/2016 CLINICAL DATA:  CSF rhinorrhea EXAM: LUMBAR SPINE - 1 VIEW; DG C-ARM 61-120 MIN COMPARISON:  None. FINDINGS: Single intraoperative spot image demonstrates catheter tip projecting over the midline of the spine. IMPRESSION: As above. Electronically Signed   By: Rolm Baptise M.D.   On: 01/14/2016 14:45   Dg C-arm 1-60 Min  Result Date: 01/14/2016 CLINICAL DATA:  CSF rhinorrhea EXAM: LUMBAR SPINE - 1 VIEW; DG C-ARM 61-120 MIN COMPARISON:  None. FINDINGS: Single intraoperative spot image demonstrates catheter tip projecting over the midline of the spine. IMPRESSION: As above. Electronically Signed   By: Rolm Baptise M.D.   On: 01/14/2016 14:45    Anti-infectives: Anti-infectives    Start     Dose/Rate Route Frequency Ordered Stop   01/14/16 2130  ceFAZolin (ANCEF) IVPB  2g/100 mL premix     2 g 200 mL/hr over 30 Minutes Intravenous Every 8 hours 01/14/16 1530  01/15/16 0625   01/14/16 1336  ceFAZolin (ANCEF) 2-4 GM/100ML-% IVPB    Comments:  Forte, Lindsi   : cabinet override      01/14/16 1336 01/15/16 0144   01/07/16 0600  ceFAZolin (ANCEF) IVPB 2g/100 mL premix     2 g 200 mL/hr over 30 Minutes Intravenous On call to O.R. 01/06/16 1947 01/07/16 1155      Assessment/Plan: Fall TBI/B frontal ICC/basilar skull FXs including clivus, sphenoid, R pterygoid- per Dr. Arnoldo Morale, exam remains good. Lumbar drain placed 10/9. Continue for several days per Dr. Arnoldo Morale. B max sinus/palate/B TMJ/B orbit FXs- S/P MMF by Dr. Marla Roe 10/2 ETOH abuse- CIWA Occipital condyle FX- collar for 2 weeks then flex ex per Dr. Arnoldo Morale FEN- BMET in AM VTE- SCD's Dispo- ICU for lumbar drain  LOS: 10 days    Georganna Skeans, MD, MPH, FACS Trauma: [phone] General Surgery: [phone]  10/10/2017Patient ID: Patrick [name], male   DOB: [birth year], 60 y.o.   MRN: [id number]
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
## 2016-01-15 NOTE — Progress Notes (Signed)
OT Cancellation Note  Patient Details Name: Patrick [name] MRN: [id number] DOB: 1956/01/21   Cancelled Treatment:    Reason Eval/Treat Not Completed: Patient not medically ready. Pt currently with a drain in the lumbar region with CSF leak. Pt pending possible craniotomy by Dr Audry Riles, Kinnie Feil   OTR/L Pager: [phone] Office: [phone] .  01/15/2016, 10:30 AM
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
## 2016-01-16 LAB — BASIC METABOLIC PANEL
ANION GAP: 10 (ref 5–15)
BUN: 10 mg/dL (ref 6–20)
CALCIUM: 9.5 mg/dL (ref 8.9–10.3)
CHLORIDE: 94 mmol/L — AB (ref 101–111)
CO2: 29 mmol/L (ref 22–32)
Creatinine, Ser: 0.72 mg/dL (ref 0.61–1.24)
GFR calc non Af Amer: >60 mL/min (ref >60)
GLUCOSE: 126 mg/dL — AB (ref 65–99)
Potassium: 4.5 mmol/L (ref 3.5–5.1)
Sodium: 133 mmol/L — ABNORMAL LOW (ref 135–145)
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
## 2016-01-16 NOTE — Progress Notes (Signed)
Trauma Service Note  Subjective: Patient conversant.  No more CSF leakage with lumbar drain in place and the patient at 40 degrees  Objective: Vital signs in last 24 hours: Temp:  [97.8 F (36.6 C)-98.3 F (36.8 C)] 97.9 F (36.6 C) (10/11 0800) Pulse Rate:  [57-83] 63 (10/11 0800) Resp:  [7-20] 14 (10/11 0800) BP: (136-175)/(74-111) 175/94 (10/11 0800) SpO2:  [92 %-99 %] 97 % (10/11 0800) Last BM Date: 01/04/16  Intake/Output from previous day: 10/10 0701 - 10/11 0700 In: 4175 [P.O.:2300; I.V.:1875] Out: 5977 [Urine:5575; Drains:402] Intake/Output this shift: Total I/O In: 450 [P.O.:300; I.V.:150] Out: 35 [Drains:35]  General: No distress  Lungs: Clear  Abd: Soft. Good bowel sounds  Extremities: No changes.  Neuro: Intact  Lab Results: CBC   Recent Labs  01/14/16 1011  WBC 14.5*  HGB 14.2  HCT 41.8  PLT 320   BMET  Recent Labs  01/14/16 1011 01/16/16 0319  NA 134* 133*  K 4.5 4.5  CL 95* 94*  CO2 29 29  GLUCOSE 138* 126*  BUN 12 10  CREATININE 0.76 0.72  CALCIUM 9.9 9.5   PT/INR  Recent Labs  01/14/16 1011  LABPROT 13.4  INR 1.02   ABG No results for input(s): PHART, HCO3 in the last 72 hours.  Invalid input(s): PCO2, PO2  Studies/Results: Dg Lumbar Spine 1 View  Result Date: 01/14/2016 CLINICAL DATA:  CSF rhinorrhea EXAM: LUMBAR SPINE - 1 VIEW; DG C-ARM 61-120 MIN COMPARISON:  None. FINDINGS: Single intraoperative spot image demonstrates catheter tip projecting over the midline of the spine. IMPRESSION: As above. Electronically Signed   By: Rolm Baptise M.D.   On: 01/14/2016 14:45   Dg C-arm 1-60 Min  Result Date: 01/14/2016 CLINICAL DATA:  CSF rhinorrhea EXAM: LUMBAR SPINE - 1 VIEW; DG C-ARM 61-120 MIN COMPARISON:  None. FINDINGS: Single intraoperative spot image demonstrates catheter tip projecting over the midline of the spine. IMPRESSION: As above. Electronically Signed   By: Rolm Baptise M.D.   On: 01/14/2016 14:45     Anti-infectives: Anti-infectives    Start     Dose/Rate Route Frequency Ordered Stop   01/14/16 2130  ceFAZolin (ANCEF) IVPB 2g/100 mL premix     2 g 200 mL/hr over 30 Minutes Intravenous Every 8 hours 01/14/16 1530 01/15/16 0625   01/14/16 1336  ceFAZolin (ANCEF) 2-4 GM/100ML-% IVPB    Comments:  Forte, Lindsi   : cabinet override      01/14/16 1336 01/15/16 0144   01/07/16 0600  ceFAZolin (ANCEF) IVPB 2g/100 mL premix     2 g 200 mL/hr over 30 Minutes Intravenous On call to O.R. 01/06/16 1947 01/07/16 1155      Assessment/Plan: s/p Procedure(s): PLACEMENT OF LUMBAR DRAIN No changes.  Only one bowel movement in 10 days but patient does not feel bloated   LOS: 11 days   Kathryne Eriksson. Dahlia Bailiff, MD, FACS [phone] Trauma Surgeon 01/16/2016

## 2016-01-16 NOTE — Progress Notes (Signed)
Pt pulled lumber drain tubing and white part of tubing (that was sutured to drain tubing) came apart. I was able to reconnect it and tape it to patient's back. Dr. Arnoldo Morale aware. Will cont to monitor.

## 2016-01-17 MED ORDER — ENSURE ENLIVE PO LIQD
237.0000 mL | Freq: Two times a day (BID) | ORAL | Status: DC
  Administered 2016-01-17 – 2016-01-21 (×8): 237 mL via ORAL

## 2016-01-17 NOTE — Progress Notes (Signed)
Trauma Service Note  Subjective: Patient doing okay.  Objective: Vital signs in last 24 hours: Temp:  [97.7 F (36.5 C)-98.6 F (37 C)] 97.9 F (36.6 C) (10/12 0800) Pulse Rate:  [59-88] 62 (10/12 0800) Resp:  [9-18] 12 (10/12 0800) BP: (113-191)/(72-134) 152/96 (10/12 0800) SpO2:  [90 %-100 %] 97 % (10/12 0800) Last BM Date: 01/04/16  Intake/Output from previous day: 10/11 0701 - 10/12 0700 In: 2825 [P.O.:1100; I.V.:1725] Out: 2368 [Urine:2025; Drains:343] Intake/Output this shift: Total I/O In: -  Out: 388 [Urine:375; Drains:13]  General: No distress, but still having significant headaches  Lungs: Clear  Abd: Benign, good bowel sounds.  No BM recently  Extremities: SCDs in place, no Lovenox.  No clinical signs or symptoms of DVT  Neuro: Intact  Lab Results: CBC   Recent Labs  01/14/16 1011  WBC 14.5*  HGB 14.2  HCT 41.8  PLT 320   BMET  Recent Labs  01/14/16 1011 01/16/16 0319  NA 134* 133*  K 4.5 4.5  CL 95* 94*  CO2 29 29  GLUCOSE 138* 126*  BUN 12 10  CREATININE 0.76 0.72  CALCIUM 9.9 9.5   PT/INR  Recent Labs  01/14/16 1011  LABPROT 13.4  INR 1.02   ABG No results for input(s): PHART, HCO3 in the last 72 hours.  Invalid input(s): PCO2, PO2  Studies/Results: No results found.  Anti-infectives: Anti-infectives    Start     Dose/Rate Route Frequency Ordered Stop   01/14/16 2130  ceFAZolin (ANCEF) IVPB 2g/100 mL premix     2 g 200 mL/hr over 30 Minutes Intravenous Every 8 hours 01/14/16 1530 01/15/16 0625   01/14/16 1336  ceFAZolin (ANCEF) 2-4 GM/100ML-% IVPB    Comments:  Forte, Lindsi   : cabinet override      01/14/16 1336 01/15/16 0144   01/07/16 0600  ceFAZolin (ANCEF) IVPB 2g/100 mL premix     2 g 200 mL/hr over 30 Minutes Intravenous On call to O.R. 01/06/16 1947 01/07/16 1155      Assessment/Plan: s/p Procedure(s): PLACEMENT OF LUMBAR DRAIN Ask NS if Lovenox is ok.  No other changes  LOS: 12 days   Patrick [name].  Dahlia Bailiff, MD, FACS [phone] Trauma Surgeon 01/17/2016

## 2016-01-17 NOTE — Progress Notes (Signed)
OT Cancellation Note  Patient Details Name: Patrick [name] MRN: [id number] DOB: Jun 09, 1955   Cancelled Treatment:    Reason Eval/Treat Not Completed: Patient not medically ready.  [name] M.S., OTR/L Pager: [phone]  01/17/2016, 9:55 AM
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
## 2016-01-18 MED ORDER — FLEET ENEMA 7-19 GM/118ML RE ENEM
1.0000 | ENEMA | Freq: Once | RECTAL | Status: DC
Start: 2016-01-18 — End: 2016-01-22

## 2016-01-18 MED ORDER — HYDRALAZINE HCL 20 MG/ML IJ SOLN
10.0000 mg | Freq: Once | INTRAMUSCULAR | Status: AC
  Administered 2016-01-19: 10 mg via INTRAVENOUS
  Filled 2016-01-18: qty 1

## 2016-01-18 NOTE — Progress Notes (Signed)
OT Cancellation Note  Patient Details Name: Patrick [name] MRN: [id number] DOB: [birth year]   Cancelled Treatment:    Reason Eval/Treat Not Completed: Patient not medically ready  Vonita Moss   OTR/L Pager: [phone] Office: [phone] .  01/18/2016, 8:11 AM

## 2016-01-18 NOTE — Progress Notes (Signed)
Patient ID: Patrick [name], male   DOB: 11-18-1955, 60 y.o.   MRN: [id number] Subjective:  The patient is alert and pleasant. He continues to have a moderate headache.  Objective: Vital signs in last 24 hours: Temp:  [97.7 F (36.5 C)-97.9 F (36.6 C)] 97.8 F (36.6 C) (10/13 0334) Pulse Rate:  [62-96] 66 (10/13 0700) Resp:  [8-25] 17 (10/13 0700) BP: (96-190)/(74-123) 145/98 (10/13 0700) SpO2:  [94 %-100 %] 98 % (10/13 0700)  Intake/Output from previous day: 10/12 0701 - 10/13 0700 In: -  Out: 3304 [Urine:2925; Drains:379] Intake/Output this shift: No intake/output data recorded.  Physical exam the patient is alert and pleasant. He is moving all 4 extremities well. He continues to have some cognitive deficits, asking the same questions daily.  Lab Results: No results for input(s): WBC, HGB, HCT, PLT in the last 72 hours. BMET  Recent Labs  01/16/16 0319  NA 133*  K 4.5  CL 94*  CO2 29  GLUCOSE 126*  BUN 10  CREATININE 0.72  CALCIUM 9.5    Studies/Results: No results found.  Assessment/Plan: CSF fistula, traumatic brain injury: The plan is to keep his lumbar drain open until Sunday. We will clamp it on Sunday. If he continues to leak we will plan to do surgery next week.  LOS: 13 days     Fryda Molenda D 01/18/2016, 7:51 AM

## 2016-01-18 NOTE — Progress Notes (Signed)
Trauma Service Note  Subjective: Patient doing okay.  Still has a headache, but he stated that it is mildly better.  Objective: Vital signs in last 24 hours: Temp:  [96.7 F (35.9 C)-97.9 F (36.6 C)] 96.7 F (35.9 C) (10/13 0800) Pulse Rate:  [62-96] 80 (10/13 0800) Resp:  [8-25] 9 (10/13 0800) BP: (96-190)/(74-123) 161/103 (10/13 0800) SpO2:  [94 %-100 %] 96 % (10/13 0800) Last BM Date: 01/04/16  Intake/Output from previous day: 10/12 0701 - 10/13 0700 In: -  Out: 3304 [Urine:2925; Drains:379] Intake/Output this shift: No intake/output data recorded.  General: No distress.  Drain output over 300cc per 24 hours.  Lungs: Clear.  Abd: Benign  Extremities: Intact.  No clinical signs or symptoms of DVT  Neuro: Intact  Lab Results: CBC  No results for input(s): WBC, HGB, HCT, PLT in the last 72 hours. BMET  Recent Labs  01/16/16 0319  NA 133*  K 4.5  CL 94*  CO2 29  GLUCOSE 126*  BUN 10  CREATININE 0.72  CALCIUM 9.5   PT/INR No results for input(s): LABPROT, INR in the last 72 hours. ABG No results for input(s): PHART, HCO3 in the last 72 hours.  Invalid input(s): PCO2, PO2  Studies/Results: No results found.  Anti-infectives: Anti-infectives    Start     Dose/Rate Route Frequency Ordered Stop   01/14/16 2130  ceFAZolin (ANCEF) IVPB 2g/100 mL premix     2 g 200 mL/hr over 30 Minutes Intravenous Every 8 hours 01/14/16 1530 01/15/16 0625   01/14/16 1336  ceFAZolin (ANCEF) 2-4 GM/100ML-% IVPB    Comments:  Forte, Lindsi   : cabinet override      01/14/16 1336 01/15/16 0144   01/07/16 0600  ceFAZolin (ANCEF) IVPB 2g/100 mL premix     2 g 200 mL/hr over 30 Minutes Intravenous On call to O.R. 01/06/16 1947 01/07/16 1155      Assessment/Plan: s/p Procedure(s): PLACEMENT OF LUMBAR DRAIN CPM with lumbar drain.  Possible surgery next week if the symptoms do not resolve or worsen with clamping on Sunday.  LOS: 13 days   Kathryne Eriksson. Dahlia Bailiff, MD,  FACS [phone] Trauma Surgeon 01/18/2016

## 2016-01-19 MED ORDER — BETHANECHOL CHLORIDE 25 MG PO TABS
25.0000 mg | ORAL_TABLET | Freq: Three times a day (TID) | ORAL | Status: DC
  Administered 2016-01-19 – 2016-01-22 (×9): 25 mg via ORAL
  Filled 2016-01-19 (×10): qty 1

## 2016-01-19 NOTE — Progress Notes (Signed)
No acute events Awake and alert Moves all extremities well No CSF leak Stable Continue drain, clamp tomorrow

## 2016-01-20 NOTE — Progress Notes (Signed)
No CSF rhinorrhea Awake, alert, oriented Moving all extremities Clamp drain

## 2016-01-20 NOTE — Progress Notes (Signed)
Trauma Service Note  Subjective: Some headache still  Objective: Vital signs in last 24 hours: HR 95 BP 145/86 RR 20 SpO2 96%  General: NAD  Lungs: Ctab  Abd: soft, NT, ND  Extremities: no edema  Neuro: AOx3  Face: jaw wired together  Lab Results: CBC  No results for input(s): WBC, HGB, HCT, PLT in the last 72 hours. BMET No results for input(s): NA, K, CL, CO2, GLUCOSE, BUN, CREATININE, CALCIUM in the last 72 hours. PT/INR No results for input(s): LABPROT, INR in the last 72 hours. ABG No results for input(s): PHART, HCO3 in the last 72 hours.  Invalid input(s): PCO2, PO2  Studies/Results: No results found.  Anti-infectives: Anti-infectives    Start     Dose/Rate Route Frequency Ordered Stop   01/14/16 2130  ceFAZolin (ANCEF) IVPB 2g/100 mL premix     2 g 200 mL/hr over 30 Minutes Intravenous Every 8 hours 01/14/16 1530 01/15/16 0625   01/14/16 1336  ceFAZolin (ANCEF) 2-4 GM/100ML-% IVPB    Comments:  Forte, Lindsi   : cabinet override      01/14/16 1336 01/15/16 0144   01/07/16 0600  ceFAZolin (ANCEF) IVPB 2g/100 mL premix     2 g 200 mL/hr over 30 Minutes Intravenous On call to O.R. 01/06/16 1947 01/07/16 1155      Medications Scheduled Meds: . bethanechol  25 mg Oral TID  . docusate  100 mg Oral BID  . feeding supplement (ENSURE ENLIVE)  237 mL Oral BID BM  . fentaNYL  50 mcg Transdermal Q72H  . multivitamin  15 mL Oral Daily  . polyethylene glycol  17 g Oral Daily  . sodium chloride flush  3 mL Intravenous Q12H  . sodium phosphate  1 enema Rectal Once   Continuous Infusions:  PRN Meds:.alum & mag hydroxide-simeth, diazepam, HYDROcodone-acetaminophen, HYDROmorphone (DILAUDID) injection, ondansetron **OR** ondansetron (ZOFRAN) IV, phenol, sodium chloride flush  Assessment/Plan: s/p Procedure(s): PLACEMENT OF LUMBAR DRAIN Lumbar drain in for csf leak, more alert today -continue current management -ICU for CSF drain   LOS: 15 days   [place] Surgeon [phone] Surgery 01/19/16
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
## 2016-01-21 MED ORDER — ENSURE ENLIVE PO LIQD
237.0000 mL | Freq: Four times a day (QID) | ORAL | Status: DC
  Administered 2016-01-21 – 2016-01-22 (×4): 237 mL via ORAL

## 2016-01-21 MED ORDER — TAMSULOSIN HCL 0.4 MG PO CAPS
0.4000 mg | ORAL_CAPSULE | Freq: Every day | ORAL | Status: DC
  Administered 2016-01-21 – 2016-01-22 (×2): 0.4 mg via ORAL
  Filled 2016-01-21 (×2): qty 1
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
## 2016-01-21 NOTE — Progress Notes (Signed)
Patient ID: Patrick [name], male   DOB: [birth year], 60 y.o.   MRN: [id number] Subjective:  The patient is alert and pleasant. He is in no apparent distress.  Objective: Vital signs in last 24 hours: Temp:  [97.4 F (36.3 C)-97.9 F (36.6 C)] 97.9 F (36.6 C) (10/16 0800) Pulse Rate:  [63-103] 103 (10/16 1000) Resp:  [7-20] 20 (10/16 1000) BP: (109-159)/(66-100) 139/84 (10/16 1000) SpO2:  [91 %-97 %] 97 % (10/16 1000)  Intake/Output from previous day: 10/15 0701 - 10/16 0700 In: 2587 [P.O.:2577; I.V.:10] Out: 2472 [Urine:2435; Drains:37] Intake/Output this shift: No intake/output data recorded.  Physical exam the patient is alert and oriented. His speech is normal. He is moving all 4 extremities well. He has a left facial palsy.  There is no obvious CSF rhinorrhea. His lumbar drain has been clamped for 24 hours. I removed it.  Lab Results: No results for input(s): WBC, HGB, HCT, PLT in the last 72 hours. BMET No results for input(s): NA, K, CL, CO2, GLUCOSE, BUN, CREATININE, CALCIUM in the last 72 hours.  Studies/Results: No results found.  Assessment/Plan: Traumatic brain injury, cerebral contusions: He seems to be in good progress.  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 We will continue observation. If he begins to leak again he will need a craniotomy for pair of a CSF fistula.  Cervical fractures: The should heal in a collar.  I have spoken with the patient's wife and answered all her questions. He can be mobilized from my point of view.    LOS: 16 days     Delisha Peaden D 01/21/2016, 10:05 AM
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
## 2016-01-21 NOTE — Progress Notes (Signed)
I met with pt at bedside to discuss his progress today with therapy. I also spoke with his wife by phone. They both would like to pursue an inpt rehab admission with [employer]. I will begin authorization. [phone]

## 2016-01-21 NOTE — Progress Notes (Signed)
Trauma Service Note  Subjective: Patient doing well.  No drainage since lumbar drain has been clamped.  Objective: Vital signs in last 24 hours: Temp:  [97.4 F (36.3 C)-97.9 F (36.6 C)] 97.9 F (36.6 C) (10/16 0800) Pulse Rate:  [63-99] 78 (10/16 0800) Resp:  [7-19] 17 (10/16 0800) BP: (109-159)/(66-100) 149/96 (10/16 0800) SpO2:  [91 %-97 %] 94 % (10/16 0800) Last BM Date: 01/20/16  Intake/Output from previous day: 10/15 0701 - 10/16 0700 In: 2587 [P.O.:2577; I.V.:10] Out: 2472 [Urine:2435; Drains:37] Intake/Output this shift: No intake/output data recorded.  General: No distress.  Headaches better  Lungs: Clear  Abd: Benign  Extremities: Intact.  No clinical signs or symptom of DVT  Neuro: Intact  Lab Results: CBC  No results for input(s): WBC, HGB, HCT, PLT in the last 72 hours. BMET No results for input(s): NA, K, CL, CO2, GLUCOSE, BUN, CREATININE, CALCIUM in the last 72 hours. PT/INR No results for input(s): LABPROT, INR in the last 72 hours. ABG No results for input(s): PHART, HCO3 in the last 72 hours.  Invalid input(s): PCO2, PO2  Studies/Results: No results found.  Anti-infectives: Anti-infectives    Start     Dose/Rate Route Frequency Ordered Stop   01/14/16 2130  ceFAZolin (ANCEF) IVPB 2g/100 mL premix     2 g 200 mL/hr over 30 Minutes Intravenous Every 8 hours 01/14/16 1530 01/15/16 0625   01/14/16 1336  ceFAZolin (ANCEF) 2-4 GM/100ML-% IVPB    Comments:  Forte, Lindsi   : cabinet override      01/14/16 1336 01/15/16 0144   01/07/16 0600  ceFAZolin (ANCEF) IVPB 2g/100 mL premix     2 g 200 mL/hr over 30 Minutes Intravenous On call to O.R. 01/06/16 1947 01/07/16 1155      Assessment/Plan: s/p Procedure(s): PLACEMENT OF LUMBAR DRAIN Will start therapy after drain is out according to neurosurgery recommendation.  LOS: 16 days   Kathryne Eriksson. Dahlia Bailiff, MD, FACS [phone] Trauma Surgeon 01/21/2016
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
## 2016-01-22 ENCOUNTER — Encounter (HOSPITAL_COMMUNITY): Payer: Self-pay | Admitting: Nurse Practitioner

## 2016-01-22 ENCOUNTER — Inpatient Hospital Stay (HOSPITAL_COMMUNITY)
Admission: RE | Admit: 2016-01-22 | Discharge: 2016-01-29 | DRG: 560 | Disposition: A | Discharge status: Home / Self Care | Payer: Managed Care, Other (non HMO) | Source: Intra-hospital | Attending: Physical Medicine & Rehabilitation | Admitting: Physical Medicine & Rehabilitation

## 2016-01-22 DIAGNOSIS — E871 Hypo-osmolality and hyponatremia: Secondary | ICD-10-CM

## 2016-01-22 DIAGNOSIS — S02113D Unspecified occipital condyle fracture, subsequent encounter for fracture with routine healing: Secondary | ICD-10-CM

## 2016-01-22 DIAGNOSIS — I2510 Atherosclerotic heart disease of native coronary artery without angina pectoris: Secondary | ICD-10-CM | POA: Diagnosis not present

## 2016-01-22 DIAGNOSIS — R8290 Unspecified abnormal findings in urine: Secondary | ICD-10-CM

## 2016-01-22 DIAGNOSIS — G441 Vascular headache, not elsewhere classified: Secondary | ICD-10-CM | POA: Diagnosis not present

## 2016-01-22 DIAGNOSIS — I10 Essential (primary) hypertension: Secondary | ICD-10-CM | POA: Diagnosis not present

## 2016-01-22 DIAGNOSIS — K592 Neurogenic bowel, not elsewhere classified: Secondary | ICD-10-CM

## 2016-01-22 DIAGNOSIS — K5900 Constipation, unspecified: Secondary | ICD-10-CM | POA: Diagnosis not present

## 2016-01-22 DIAGNOSIS — D72829 Elevated white blood cell count, unspecified: Secondary | ICD-10-CM | POA: Diagnosis not present

## 2016-01-22 DIAGNOSIS — Z79899 Other long term (current) drug therapy: Secondary | ICD-10-CM | POA: Diagnosis not present

## 2016-01-22 DIAGNOSIS — R7301 Impaired fasting glucose: Secondary | ICD-10-CM | POA: Diagnosis not present

## 2016-01-22 DIAGNOSIS — S0452XD Injury of facial nerve, left side, subsequent encounter: Secondary | ICD-10-CM | POA: Diagnosis not present

## 2016-01-22 DIAGNOSIS — Z7982 Long term (current) use of aspirin: Secondary | ICD-10-CM | POA: Diagnosis not present

## 2016-01-22 DIAGNOSIS — S066X9D Traumatic subarachnoid hemorrhage with loss of consciousness of unspecified duration, subsequent encounter: Secondary | ICD-10-CM | POA: Diagnosis not present

## 2016-01-22 DIAGNOSIS — S062X9A Diffuse traumatic brain injury with loss of consciousness of unspecified duration, initial encounter: Secondary | ICD-10-CM

## 2016-01-22 DIAGNOSIS — S12200D Unspecified displaced fracture of third cervical vertebra, subsequent encounter for fracture with routine healing: Secondary | ICD-10-CM | POA: Diagnosis not present

## 2016-01-22 DIAGNOSIS — W109XXD Fall (on) (from) unspecified stairs and steps, subsequent encounter: Secondary | ICD-10-CM | POA: Diagnosis not present

## 2016-01-22 DIAGNOSIS — S02109D Fracture of base of skull, unspecified side, subsequent encounter for fracture with routine healing: Principal | ICD-10-CM

## 2016-01-22 DIAGNOSIS — F4320 Adjustment disorder, unspecified: Secondary | ICD-10-CM

## 2016-01-22 DIAGNOSIS — R339 Retention of urine, unspecified: Secondary | ICD-10-CM

## 2016-01-22 DIAGNOSIS — Z87891 Personal history of nicotine dependence: Secondary | ICD-10-CM

## 2016-01-22 DIAGNOSIS — S12500D Unspecified displaced fracture of sixth cervical vertebra, subsequent encounter for fracture with routine healing: Secondary | ICD-10-CM | POA: Diagnosis not present

## 2016-01-22 DIAGNOSIS — D72820 Lymphocytosis (symptomatic): Secondary | ICD-10-CM

## 2016-01-22 DIAGNOSIS — S0281XD Fracture of other specified skull and facial bones, right side, subsequent encounter for fracture with routine healing: Secondary | ICD-10-CM

## 2016-01-22 DIAGNOSIS — G519 Disorder of facial nerve, unspecified: Secondary | ICD-10-CM | POA: Diagnosis not present

## 2016-01-22 DIAGNOSIS — S02109S Fracture of base of skull, unspecified side, sequela: Secondary | ICD-10-CM

## 2016-01-22 DIAGNOSIS — Z8673 Personal history of transient ischemic attack (TIA), and cerebral infarction without residual deficits: Secondary | ICD-10-CM | POA: Diagnosis not present

## 2016-01-22 DIAGNOSIS — T1490XA Injury, unspecified, initial encounter: Secondary | ICD-10-CM

## 2016-01-22 DIAGNOSIS — S02109A Fracture of base of skull, unspecified side, initial encounter for closed fracture: Secondary | ICD-10-CM | POA: Diagnosis present

## 2016-01-22 DIAGNOSIS — E785 Hyperlipidemia, unspecified: Secondary | ICD-10-CM

## 2016-01-22 DIAGNOSIS — R51 Headache: Secondary | ICD-10-CM

## 2016-01-22 DIAGNOSIS — R52 Pain, unspecified: Secondary | ICD-10-CM

## 2016-01-22 DIAGNOSIS — G510 Bell's palsy: Secondary | ICD-10-CM

## 2016-01-22 DIAGNOSIS — S02101S Fracture of base of skull, right side, sequela: Secondary | ICD-10-CM | POA: Diagnosis not present

## 2016-01-22 LAB — CBC WITH DIFFERENTIAL/PLATELET
BASOS PCT: 0 %
Basophils Absolute: 0 10*3/uL (ref 0.0–0.1)
EOS PCT: 0 %
Eosinophils Absolute: 0 10*3/uL (ref 0.0–0.7)
HEMATOCRIT: 40.3 % (ref 39.0–52.0)
HEMOGLOBIN: 13.4 g/dL (ref 13.0–17.0)
Lymphocytes Relative: 5 %
Lymphs Abs: 0.9 10*3/uL (ref 0.7–4.0)
MCH: 29.7 pg (ref 26.0–34.0)
MCHC: 33.3 g/dL (ref 30.0–36.0)
MCV: 89.4 fL (ref 78.0–100.0)
MONO ABS: 0.7 10*3/uL (ref 0.1–1.0)
MONOS PCT: 4 %
NEUTROS PCT: 91 %
Neutro Abs: 15.5 10*3/uL — ABNORMAL HIGH (ref 1.7–7.7)
PLATELETS: 367 10*3/uL (ref 150–400)
RBC: 4.51 MIL/uL (ref 4.22–5.81)
RDW: 12.8 % (ref 11.5–15.5)
WBC: 17.1 10*3/uL — ABNORMAL HIGH (ref 4.0–10.5)

## 2016-01-22 LAB — COMPREHENSIVE METABOLIC PANEL
ALK PHOS: 82 U/L (ref 38–126)
ALT: 25 U/L (ref 17–63)
AST: 17 U/L (ref 15–41)
Albumin: 3.4 g/dL — ABNORMAL LOW (ref 3.5–5.0)
Anion gap: 12 (ref 5–15)
BILIRUBIN TOTAL: 0.5 mg/dL (ref 0.3–1.2)
BUN: 12 mg/dL (ref 6–20)
CALCIUM: 10 mg/dL (ref 8.9–10.3)
CO2: 29 mmol/L (ref 22–32)
CREATININE: 0.87 mg/dL (ref 0.61–1.24)
Chloride: 95 mmol/L — ABNORMAL LOW (ref 101–111)
Glucose, Bld: 151 mg/dL — ABNORMAL HIGH (ref 65–99)
Potassium: 4 mmol/L (ref 3.5–5.1)
Sodium: 136 mmol/L (ref 135–145)
TOTAL PROTEIN: 6.9 g/dL (ref 6.5–8.1)

## 2016-01-22 MED ORDER — HYDROCODONE-ACETAMINOPHEN 7.5-325 MG/15ML PO SOLN
10.0000 mL | ORAL | Status: DC | PRN
  Administered 2016-01-22 – 2016-01-25 (×13): 20 mL via ORAL
  Administered 2016-01-25 (×2): 15 mL via ORAL
  Administered 2016-01-26 – 2016-01-27 (×6): 20 mL via ORAL
  Administered 2016-01-27: 15 mL via ORAL
  Administered 2016-01-27: 20 mL via ORAL
  Administered 2016-01-27 – 2016-01-28 (×3): 15 mL via ORAL
  Administered 2016-01-28: 20 mL via ORAL
  Administered 2016-01-28 (×3): 15 mL via ORAL
  Administered 2016-01-29 (×4): 20 mL via ORAL
  Filled 2016-01-22 (×4): qty 30
  Filled 2016-01-22: qty 15
  Filled 2016-01-22 (×2): qty 30
  Filled 2016-01-22: qty 15
  Filled 2016-01-22 (×4): qty 30
  Filled 2016-01-22: qty 15
  Filled 2016-01-22 (×2): qty 30
  Filled 2016-01-22: qty 15
  Filled 2016-01-22: qty 30
  Filled 2016-01-22 (×2): qty 15
  Filled 2016-01-22 (×8): qty 30
  Filled 2016-01-22 (×2): qty 15
  Filled 2016-01-22 (×6): qty 30

## 2016-01-22 MED ORDER — GUAIFENESIN-DM 100-10 MG/5ML PO SYRP: 5.0000 mL | ORAL_SOLUTION | Freq: Four times a day (QID) | ORAL | Status: DC | PRN

## 2016-01-22 MED ORDER — TAMSULOSIN HCL 0.4 MG PO CAPS: 0.4000 mg | ORAL_CAPSULE | Freq: Every day | ORAL | Status: DC

## 2016-01-22 MED ORDER — DIPHENHYDRAMINE HCL 12.5 MG/5ML PO ELIX: 12.5000 mg | ORAL_SOLUTION | Freq: Four times a day (QID) | ORAL | Status: DC | PRN

## 2016-01-22 MED ORDER — ACETAMINOPHEN 325 MG PO TABS: 325.0000 mg | ORAL_TABLET | ORAL | Status: DC | PRN

## 2016-01-22 MED ORDER — ENSURE ENLIVE PO LIQD
237.0000 mL | Freq: Four times a day (QID) | ORAL | Status: DC
  Administered 2016-01-22 – 2016-01-28 (×16): 237 mL via ORAL

## 2016-01-22 MED ORDER — TRAZODONE HCL 50 MG PO TABS: 25.0000 mg | ORAL_TABLET | Freq: Every evening | ORAL | Status: DC | PRN

## 2016-01-22 MED ORDER — TRAZODONE HCL 50 MG PO TABS
25.0000 mg | ORAL_TABLET | Freq: Every evening | ORAL | Status: DC | PRN
  Administered 2016-01-25: 25 mg via ORAL
  Administered 2016-01-28: 50 mg via ORAL
  Filled 2016-01-22 (×3): qty 1

## 2016-01-22 MED ORDER — PHENOL 1.4 % MT LIQD: 1.0000 | OROMUCOSAL | Status: DC | PRN

## 2016-01-22 MED ORDER — ONDANSETRON HCL 4 MG/2ML IJ SOLN: 4.0000 mg | Freq: Four times a day (QID) | INTRAMUSCULAR | Status: DC | PRN

## 2016-01-22 MED ORDER — ALUM & MAG HYDROXIDE-SIMETH 200-200-20 MG/5ML PO SUSP: 30.0000 mL | Freq: Four times a day (QID) | ORAL | Status: DC | PRN

## 2016-01-22 MED ORDER — ONDANSETRON HCL 4 MG PO TABS: 4.0000 mg | ORAL_TABLET | Freq: Four times a day (QID) | ORAL | Status: DC | PRN

## 2016-01-22 MED ORDER — FLEET ENEMA 7-19 GM/118ML RE ENEM: 1.0000 | ENEMA | Freq: Once | RECTAL | Status: DC | PRN

## 2016-01-22 MED ORDER — TAMSULOSIN HCL 0.4 MG PO CAPS
0.4000 mg | ORAL_CAPSULE | Freq: Every day | ORAL | Status: DC
  Administered 2016-01-23 – 2016-01-29 (×7): 0.4 mg via ORAL
  Filled 2016-01-22 (×7): qty 1

## 2016-01-22 MED ORDER — BISACODYL 10 MG RE SUPP: 10.0000 mg | Freq: Every day | RECTAL | Status: DC | PRN

## 2016-01-22 MED ORDER — GUAIFENESIN-DM 100-10 MG/5ML PO SYRP
5.0000 mL | ORAL_SOLUTION | Freq: Four times a day (QID) | ORAL | Status: DC | PRN
  Administered 2016-01-28: 10 mL via ORAL
  Filled 2016-01-22: qty 10

## 2016-01-22 MED ORDER — ALUM & MAG HYDROXIDE-SIMETH 200-200-20 MG/5ML PO SUSP: 30.0000 mL | ORAL | Status: DC | PRN

## 2016-01-22 MED ORDER — TOPIRAMATE 25 MG PO TABS
50.0000 mg | ORAL_TABLET | Freq: Every day | ORAL | Status: DC
  Administered 2016-01-22 – 2016-01-29 (×8): 50 mg via ORAL
  Filled 2016-01-22 (×8): qty 2

## 2016-01-22 MED ORDER — PROCHLORPERAZINE 25 MG RE SUPP: 12.5000 mg | Freq: Four times a day (QID) | RECTAL | Status: DC | PRN

## 2016-01-22 MED ORDER — FLEET ENEMA 7-19 GM/118ML RE ENEM
1.0000 | ENEMA | Freq: Once | RECTAL | Status: AC | PRN
  Administered 2016-01-22: 1 via RECTAL
  Filled 2016-01-22: qty 1

## 2016-01-22 MED ORDER — METHOCARBAMOL 500 MG PO TABS
500.0000 mg | ORAL_TABLET | Freq: Four times a day (QID) | ORAL | Status: DC | PRN
  Administered 2016-01-23 – 2016-01-29 (×8): 500 mg via ORAL
  Filled 2016-01-22 (×12): qty 1

## 2016-01-22 MED ORDER — PROCHLORPERAZINE MALEATE 5 MG PO TABS: 5.0000 mg | ORAL_TABLET | Freq: Four times a day (QID) | ORAL | Status: DC | PRN

## 2016-01-22 MED ORDER — BETHANECHOL CHLORIDE 25 MG PO TABS
25.0000 mg | ORAL_TABLET | Freq: Four times a day (QID) | ORAL | Status: DC
  Administered 2016-01-22 – 2016-01-29 (×28): 25 mg via ORAL
  Filled 2016-01-22 (×29): qty 1

## 2016-01-22 MED ORDER — FENTANYL 25 MCG/HR TD PT72
50.0000 ug | MEDICATED_PATCH | TRANSDERMAL | Status: DC
  Administered 2016-01-23: 50 ug via TRANSDERMAL
  Filled 2016-01-22: qty 2

## 2016-01-22 MED ORDER — ADULT MULTIVITAMIN LIQUID CH
15.0000 mL | Freq: Every day | ORAL | Status: DC
  Administered 2016-01-23 – 2016-01-29 (×7): 15 mL via ORAL
  Filled 2016-01-22 (×8): qty 15

## 2016-01-22 MED ORDER — POLYETHYLENE GLYCOL 3350 17 G PO PACK
17.0000 g | PACK | Freq: Two times a day (BID) | ORAL | Status: DC
  Administered 2016-01-23 – 2016-01-25 (×4): 17 g via ORAL
  Filled 2016-01-22 (×5): qty 1

## 2016-01-22 MED ORDER — POLYETHYLENE GLYCOL 3350 17 G PO PACK: 17.0000 g | PACK | Freq: Every day | ORAL | Status: DC

## 2016-01-22 MED ORDER — PROCHLORPERAZINE EDISYLATE 5 MG/ML IJ SOLN: 5.0000 mg | Freq: Four times a day (QID) | INTRAMUSCULAR | Status: DC | PRN

## 2016-01-22 MED ORDER — BISACODYL 10 MG RE SUPP
10.0000 mg | Freq: Every day | RECTAL | Status: DC | PRN
  Administered 2016-01-23: 10 mg via RECTAL
  Filled 2016-01-22: qty 1
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
## 2016-01-22 NOTE — Progress Notes (Signed)
I await insurance decision to possible admit pt to inpt rehab today. [id number]

## 2016-01-22 NOTE — Progress Notes (Signed)
Trauma Service Note  Subjective: Patient sitting up in chair.  No distress, but complaining of headache  Objective: Vital signs in last 24 hours: Temp:  [98.1 F (36.7 C)-98.5 F (36.9 C)] 98.5 F (36.9 C) (10/17 0400) Pulse Rate:  [68-110] 110 (10/17 0800) Resp:  [6-31] 15 (10/17 0700) BP: (103-154)/(63-117) 135/101 (10/17 0800) SpO2:  [93 %-100 %] 95 % (10/17 0800) Last BM Date: 01/20/16  Intake/Output from previous day: 10/16 0701 - 10/17 0700 In: 720 [P.O.:720] Out: 2680 [Urine:2680] Intake/Output this shift: No intake/output data recorded.  General: No acute distress.  No CSF leakage from his nose  Lungs: Clear  Abd: Benign  Extremities: No changes.  No DVT clinical signs or symptoms  Neuro: Intact  Lab Results: CBC  No results for input(s): WBC, HGB, HCT, PLT in the last 72 hours. BMET No results for input(s): NA, K, CL, CO2, GLUCOSE, BUN, CREATININE, CALCIUM in the last 72 hours. PT/INR No results for input(s): LABPROT, INR in the last 72 hours. ABG No results for input(s): PHART, HCO3 in the last 72 hours.  Invalid input(s): PCO2, PO2  Studies/Results: No results found.  Anti-infectives: Anti-infectives    Start     Dose/Rate Route Frequency Ordered Stop   01/14/16 2130  ceFAZolin (ANCEF) IVPB 2g/100 mL premix     2 g 200 mL/hr over 30 Minutes Intravenous Every 8 hours 01/14/16 1530 01/15/16 0625   01/14/16 1336  ceFAZolin (ANCEF) 2-4 GM/100ML-% IVPB    Comments:  Forte, Lindsi   : cabinet override      01/14/16 1336 01/15/16 0144   01/07/16 0600  ceFAZolin (ANCEF) IVPB 2g/100 mL premix     2 g 200 mL/hr over 30 Minutes Intravenous On call to O.R. 01/06/16 1947 01/07/16 1155      Assessment/Plan: s/p Procedure(s): PLACEMENT OF LUMBAR DRAIN Transfer to floor or if bed available, can go directly to Rehab.  LOS: 17 days   Kathryne Eriksson. Dahlia Bailiff, MD, FACS [phone] Trauma Surgeon 01/22/2016
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
## 2016-01-22 NOTE — Care Management Note (Signed)
Case Management Note  Patient Details  Name: Patrick [name] MRN: [id number] Date of Birth: May 31, 1955  Subjective/Objective:  Pt medically stable for discharge today.                    Action/Plan: Pt has been accepted and insurance authorization received; plan dc to [facility] later today.    Expected Discharge Date:    01/22/2016              Expected Discharge Plan:  [name]  In-House Referral:     Discharge planning Services  CM Consult  Post Acute Care Choice:    Choice offered to:     DME Arranged:    DME Agency:     HH Arranged:    HH Agency:     Status of Service:  Completed, signed off  If discussed at [employer], dates discussed:    Additional Comments:  Reinaldo Raddle, RN, BSN  Trauma/Neuro ICU Case Manager [phone]
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
## 2016-01-22 NOTE — Progress Notes (Signed)
Patient ID: Patrick [name], male   DOB: [birth year], 60 y.o.   MRN: [id number] Subjective:  The patient is alert and pleasant. He denies nasal drainage.  Objective: Vital signs in last 24 hours: Temp:  [97.9 F (36.6 C)-98.5 F (36.9 C)] 98.5 F (36.9 C) (10/17 0400) Pulse Rate:  [68-103] 80 (10/17 0700) Resp:  [6-31] 15 (10/17 0700) BP: (103-154)/(63-117) 143/86 (10/17 0700) SpO2:  [93 %-100 %] 96 % (10/17 0700)  Intake/Output from previous day: 10/16 0701 - 10/17 0700 In: 720 [P.O.:720] Out: 2680 [Urine:2680] Intake/Output this shift: No intake/output data recorded.  Physical exam the patient is alert and oriented 3. He has a tarsal left facial nerve palsy. His speech is normal. His strength is normal.  I don't see any evidence of CSF rhinorrhea.  Lab Results: No results for input(s): WBC, HGB, HCT, PLT in the last 72 hours. BMET No results for input(s): NA, K, CL, CO2, GLUCOSE, BUN, CREATININE, CALCIUM in the last 72 hours.  Studies/Results: No results found.  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  Cerebral contusions: He is recovering.  Cervical fractures: I will plan to get flexion-extension x-rays in a few weeks  Left facial nerve palsy: Hopefully this will resolve.  LOS: 17 days     JENKINS,JEFFREY D 01/22/2016, 7:55 AM

## 2016-01-22 NOTE — Interval H&P Note (Signed)
Patrick [name] was admitted today to Inpatient Rehabilitation with the diagnosis of TBI .  The patient's history has been reviewed, patient examined, and there is no change in status.  Patient continues to be appropriate for intensive inpatient rehabilitation.  I have reviewed the patient's chart and labs.  Questions were answered to the patient's satisfaction. The PAPE has been reviewed and assessment remains appropriate.  SWARTZ,ZACHARY T 01/22/2016, 3:31 PM

## 2016-01-22 NOTE — H&P (View-Only) (Signed)
Physical Medicine and Rehabilitation Admission H&P    Chief Complaint  Patient presents with  . TBI    HPI:   Patrick [name] is a 60 y.o. male who fell down 7 stairs and landed on his face.  Patient intoxicated with amnesia of events and ETOH level 285. He was evaluated in ED on 01/05/16 and  CT head done showing multiple facial fractures, basilar skull fracture and acute SAH/IPH along anterior frontal region. Dr. Arnoldo Morale evaluated patient and recommended monitoring as patient at risk for CSF bleed. CT cervical spine revealed minimally displaced right occipital condyle fracture and nondisplaced left occipital condyle fracture, endplate fracture C3 and C6 with gas in spinal canal.  To continue aspen collar for support.  He underwent maxillomandibular fixation by Dr. Marla Roe on 10/2 and to continue on liquid diet.   He developed severe HA as well as clear drainage from nares when up due to CSF leak. He was placed on bedrest without resolution of symptoms and CT head done 10/9 showing resolving IPH with significant left frontotemporal edema, layering fluid/complete opacification of L>R sphenoid sinus most likely area of CSF leak.  He was taken to OR that day for placement of lumbar drain by Dr. Arnoldo Morale.  Lumbar drain removed yesterday and therapy resumed. Mentation has improved but he continues to have severe HA.  Foley discontinued today.   Therapy ongoing and CIR recommended due to cognitive deficits, impulsivity, poor safety due to TBI.     Review of Systems  HENT: Negative for hearing loss.   Eyes: Positive for blurred vision (left due to irritation). Negative for double vision.  Respiratory: Negative for cough and sputum production.   Cardiovascular: Negative for chest pain.  Gastrointestinal: Positive for constipation. Negative for heartburn and nausea.  Genitourinary: Negative for frequency and urgency.  Musculoskeletal: Positive for myalgias and neck pain. Negative for back pain  and joint pain.  Skin: Negative for itching and rash.  Neurological: Positive for headaches ("killing me"). Negative for dizziness and tingling.  Psychiatric/Behavioral: Negative for depression. The patient is not nervous/anxious.       Past Medical History:  Diagnosis Date  . Coronary artery disease    moderate  . Hyperlipidemia   . TIA (transient ischemic attack)     Past Surgical History:  Procedure Laterality Date  . ORIF MANDIBULAR FRACTURE N/A 01/07/2016   Procedure: OPEN REDUCTION INTERNAL FIXATION (ORIF) MANDIBULAR FRACTURE;  Surgeon: Loel Lofty Dillingham, DO;  Location: [facility];  Service: Plastics;  Laterality: N/A;  . PLACEMENT OF LUMBAR DRAIN N/A 01/14/2016   Procedure: PLACEMENT OF LUMBAR DRAIN;  Surgeon: Newman Pies, MD;  Location: [facility];  Service: Neurosurgery;  Laterality: N/A;    Family History  Problem Relation Age of Onset  . Cancer Mother   . Heart disease Father     Social History:  Married. Works as a Hotel manager. Wife works part time.  He reports that he quit smoking about 21 years ago. His smoking use included Cigarettes. He has never used smokeless tobacco. He reports that he drinks alcohol--1-2 mixed drinks daily. He reports that he does not use drugs.    Allergies: No Known Allergies    Medications Prior to Admission  Medication Sig Dispense Refill  . aspirin 325 MG tablet Take 325 mg by mouth daily.    . Coenzyme Q10 (EQL COQ10) 300 MG CAPS Take 1 capsule by mouth daily.    . rosuvastatin (CRESTOR) 20 MG tablet Take 20 mg by  mouth daily.    . tamsulosin (FLOMAX) 0.4 MG CAPS capsule Take 0.4 mg by mouth.      Home: Home Living Family/patient expects to be discharged to:: Private residence Living Arrangements: Spouse/significant other (22yo daughter) Available Help at Discharge: Family, Available 24 hours/day (for about one week. wife works part time) Type of Home: House Home Access: Stairs to enter Technical brewer of Steps: [place]: Two level, Able to live on main level with bedroom/bathroom Bathroom Shower/Tub: Multimedia programmer: Standard Bathroom Accessibility: Yes Home Equipment: None  Lives With: Spouse, Daughter   Functional History: Prior Function Level of Independence: Independent Comments: goes hiking every wkend  Functional Status:  Mobility: Bed Mobility Overal bed mobility: Needs Assistance Bed Mobility: Supine to Sit, Sit to Supine Supine to sit: Supervision Sit to supine: Supervision General bed mobility comments: supervision for safety Transfers Overall transfer level: Needs assistance Equipment used: None Transfers: Sit to/from Stand Sit to Stand: Min guard Stand pivot transfers: Min guard General transfer comment: min guard for safety and stability, no physical assist required Ambulation/Gait Ambulation/Gait assistance: Min assist Ambulation Distance (Feet): 80 Feet Assistive device: None Gait Pattern/deviations: Step-through pattern, Decreased stride length, Drifts right/left, Narrow base of support (multiple balance checks noted) General Gait Details: pt unsteady but with no LOB; min guard for safety; cues required to decrease cervical rotation  Gait velocity: decreased Gait velocity interpretation: Below normal speed for age/gender Stairs: Yes Stairs assistance: Min guard Stair Management: One rail Right, Forwards, Alternating pattern Number of Stairs: 5 General stair comments: cues for safety    ADL: ADL Overall ADL's : Needs assistance/impaired Eating/Feeding: Set up Grooming: Wash/dry hands, Wash/dry face, Minimal assistance, Standing Upper Body Bathing: Minimal assitance, Min guard Lower Body Bathing: Maximal assistance, Sit to/from stand Lower Body Dressing: Min guard Lower Body Dressing Details (indicate cue type and reason): requires cues to sequence and once cued able to long sit and don bil socks Toilet Transfer: Minimal assistance, Regular  Toilet, Grab bars Toileting- Clothing Manipulation and Hygiene: Maximal assistance Toileting - Clothing Manipulation Details (indicate cue type and reason): pt with incontinence of bowel and no awareness. pt total (A) for peri care exiting the bed. pt states can I just have a pan for in the bed. pt educated OOB to bedside with staff. pt states' so you want me to just poop in the bed then?" pt educated on use of 3n1 with RN staff Functional mobility during ADLs: Minimal assistance General ADL Comments: pt demonstrates executive cogntiive deficits and lack of insight to injuries   Cognition: Cognition Overall Cognitive Status: Impaired/Different from baseline Arousal/Alertness: Awake/alert Orientation Level: Oriented X4 Attention: Focused, Sustained, Selective Focused Attention: Appears intact Sustained Attention: Appears intact Selective Attention: Impaired Selective Attention Impairment: Verbal basic, Functional basic Memory: Impaired Memory Impairment: Storage deficit, Retrieval deficit, Decreased short term memory Decreased Short Term Memory: Verbal basic, Functional basic Awareness: Impaired Awareness Impairment: Emergent impairment, Anticipatory impairment Problem Solving: Appears intact Executive Function: Reasoning Reasoning: Impaired Reasoning Impairment: Verbal complex [employer] Scales of Cognitive Functioning: Automatic/appropriate Cognition Arousal/Alertness: Awake/alert Behavior During Therapy: Impulsive Overall Cognitive Status: Impaired/Different from baseline Area of Impairment: Safety/judgement, Awareness, Memory, Attention, Problem solving Orientation Level: Situation (pt reports "they say" and not completely convinced) Current Attention Level: Focused Memory: Decreased short-term memory Following Commands: Follows one step commands consistently Safety/Judgement: Decreased awareness of safety, Decreased awareness of deficits Awareness: Intellectual Problem  Solving: Slow processing, Decreased initiation, Difficulty sequencing General Comments: Pt asking "can  I take this collar off. I dont need it" wife at bedside adn states "the doctor told you want?" Pt states "i dont know" wife states "you have to wear if for few more weeks. he told you that" Pt demonstrates lack of insight to injuries or need for (A).    Blood pressure (!) 149/113, pulse 81, temperature 98.5 F (36.9 C), temperature source Axillary, resp. rate 13, height 6' (1.829 m), weight 82.4 kg (181 lb 10.5 oz), SpO2 99 %. Physical Exam  Constitutional: He is oriented to person, place, and time. He appears well-developed and well-nourished.  HENT:  Head: Normocephalic and atraumatic.  Eyes: Conjunctivae and EOM are normal. Pupils are equal, round, and reactive to light. Right eye exhibits no discharge. Left eye exhibits no discharge.  Bilateral orbital ecchymosis resolving.   Neck:  Neck immobilized by collar  Respiratory: Effort normal and breath sounds normal. No respiratory distress. He has no wheezes.  GI: Soft. Bowel sounds are normal. He exhibits no distension. There is no tenderness.  Musculoskeletal: He exhibits no edema or tenderness.  Neurological: He is alert and oriented to person, place, and time. He displays normal reflexes.  Alert and appropriate. Left facial weakness and decrease left lid closure/brow movement. also with mild dysarthria due to dental bars. Decreased hearing left ear. He is able to follow commands without difficulty. Insight improved but continues to have poor safety awareness. UE strength grossly 5/5 with some pain inhibition proximally.l LE: 4+ hf and ke and 5/5 adf/pf. No sensory findings.   Skin: Skin is warm and dry. No erythema.  Psychiatric: He has a normal mood and affect. His behavior is normal. Thought content normal.      No results found for this or any previous visit (from the past 48 hour(s)). No results found.     Medical Problem List  and Plan: 1.  Functional, mobility, and cognitive deficits secondary to TBI/polytrauma after fall down stairs  -admit to inpatient rehab 2.  DVT Prophylaxis/Anticoagulation: Mechanical: Sequential compression devices, below knee Bilateral lower extremities and mobility  3. HA/Pain Management: Uncontrolled on current regimen--Fentanyl 50 mcg,  hydrocodone 20 mg 4-5 times daily  And prn IV dilaudid. Will add Topamax for pain control and attempt to wean of hydrocodone as pain control improves. D/c dilaudid.  4. Mood: LCSW to follow for evaluation and support.  5. Neuropsych: This patient is not fully capable of making decisions on his own behalf. 6. Skin/Wound Care: Routine pressure relief measures.  7. Fluids/Electrolytes/Nutrition: Monitor I/O. Continue liquid diet.  8. Leucocytosis: Recheck CBC in am. Monitor for signs of infection.  9. Constipation: Add Miralax bid today and repeat enema.  10. Urinary retention?: Check UA/UCS. Monitor voiding with PVR checks. Continue urecholine and flomax. 11. Hyponatremia: Recheck labs in am.  12. Impaired fating glucose: Will check Hgb A1c.  13. Facial fractures/TMJ fracture s/p maxillomandibular fixation: Liquid diet. HOB elevated as able and No nose blowing.   -likely left CN VII injury, ?VII injury also 14. HTN: Blood pressures elevated likely due to pain. Will monitor for now.   Post Admission Physician Evaluation: 1. Functional deficits secondary  to TBI/polytrauma. 2. Patient is admitted to receive collaborative, interdisciplinary care between the physiatrist, rehab nursing staff, and therapy team. 3. Patient's level of medical complexity and substantial therapy needs in context of that medical necessity cannot be provided at a lesser intensity of care such as a SNF. 4. Patient has experienced substantial functional loss from his/her baseline which was  documented above under the "Functional History" and "Functional Status" headings.  Judging by the  patient's diagnosis, physical exam, and functional history, the patient has potential for functional progress which will result in measurable gains while on inpatient rehab.  These gains will be of substantial and practical use upon discharge  in facilitating mobility and self-care at the household level. 5. Physiatrist will provide 24 hour management of medical needs as well as oversight of the therapy plan/treatment and provide guidance as appropriate regarding the interaction of the two. 6. 24 hour rehab nursing will assist with bladder management, bowel management, safety, skin/wound care, disease management, medication administration, pain management and patient education  and help integrate therapy concepts, techniques,education, etc. 7. PT will assess and treat for/with: Lower extremity strength, range of motion, stamina, balance, functional mobility, safety, adaptive techniques and equipment, NMR, vestibular rx/assessment, pain control, ego support.   Goals are: mod I to supervision. 8. OT will assess and treat for/with: ADL's, functional mobility, safety, upper extremity strength, adaptive techniques and equipment, NMR, vestibular rx, pain mgt, community reintegration.   Goals are: mod I to supervision. Therapy may proceed with showering this patient. 9. SLP will assess and treat for/with: cognition, education.  Goals are: mod I. 10. Case Management and Social Worker will assess and treat for psychological issues and discharge planning. 11. Team conference will be held weekly to assess progress toward goals and to determine barriers to discharge. 12. Patient will receive at least 3 hours of therapy per day at least 5 days per week. 13. ELOS: 7-10 days       14. Prognosis:  excellent     Meredith Staggers, MD, [facility] 01/22/2016  01/22/2016

## 2016-01-22 NOTE — Discharge Summary (Signed)
Physician Discharge Summary  Patient ID: Patrick [name] MRN: [id number] DOB/AGE: 1955-12-29 60 y.o.  Admit date: 01/05/2016 Discharge date: 01/22/2016  Discharge Diagnoses Patient Active Problem List   Diagnosis Date Noted  . Fall 01/10/2016  . Fracture of skull base ([place]) 01/10/2016  . Multiple facial fractures ([facility]) 01/10/2016  . Alcohol abuse 01/10/2016  . Fracture of right occipital condyle ([facility]) 01/10/2016  . Subarachnoid hemorrhage following injury with brief loss of consciousness but without open intracranial wound ([place]) 01/05/2016    Consultants Dr. Newman Pies for neurosurgery  Dr. Audelia Hives for plastic surgery  Dr. Alysia Penna for PM&R   Procedures 10/2 -- Maxillomandibular fixation by Dr. Marla Roe  10/9 -- Placement of lumbar drain by Dr. Arnoldo Morale   HPI: [name] was intoxicated and fell down several stairs, landing on his face. He was transported by EMS with full immobilization and was a level 2 trauma code. There was a probable loss of consciousness and he was amnestic for the event. He would follow some commands but was quite lethargic. His workup included CT scans of the head, face, and cervical spine which showed the above-mentioned injuries. He was admitted to the trauma service and neurosurgery and plastic surgery were consulted.   Hospital Course: Plastic surgery recommended operative treatment for his facial fractures and this was performed a few days after admission. In the meantime neurosurgery recommended non-operative treatment of his brain injury and skull fractures. A follow-up head CT the next day was slightly worse but he was unchanged neurologically. The traumatic brain injury therapy team evaluated the patient and recommended inpatient rehabilitation. They were consulted and agreed with admission. As the time for discharge got close he began to have CSF rhinorrhea. This did not stop on its own and he was taken for lumbar drain  placement. This was kept in for about a week and then clamped. His rhinorrhea did not return and the drain was removed. He was then discharged to inpatient rehabilitation in good condition.   Medications Scheduled Meds: . bethanechol  25 mg Oral TID  . docusate  100 mg Oral BID  . feeding supplement (ENSURE ENLIVE)  237 mL Oral QID  . fentaNYL  50 mcg Transdermal Q72H  . multivitamin  15 mL Oral Daily  . polyethylene glycol  17 g Oral Daily  . sodium chloride flush  3 mL Intravenous Q12H  . sodium phosphate  1 enema Rectal Once  . tamsulosin  0.4 mg Oral Daily   Continuous Infusions:  PRN Meds:.alum & mag hydroxide-simeth, diazepam, HYDROcodone-acetaminophen, HYDROmorphone (DILAUDID) injection, ondansetron **OR** ondansetron (ZOFRAN) IV, phenol, sodium chloride flush   Follow-up Information    CLAIRE S DILLINGHAM, DO Follow up in 1 week(s).   Specialty:  Plastic Surgery Contact information: [address] [place] [address] [id number]        Ophelia Charter, MD. Schedule an appointment as soon as possible for a visit today.   Specialty:  Neurosurgery Contact information: [address] [place]  [address] [place] .   Why:  Call as needed Contact information: [address] [id number] [place] [phone]           Signed: Lisette Abu, PA-C Pager: [phone] General Trauma PA Pager: [phone] 01/22/2016, 10:52 AM

## 2016-01-22 NOTE — Progress Notes (Signed)
Patient ID: Patrick [name], male   DOB: 1955-04-08, 60 y.o.   MRN: [id number] Patient admitted to [phone] via wheelchair, escorted by nursing staff.  Patient verbalized understanding of rehab process, signed fall safety agreement.  Patient appears to be in no immediate distress at this time.  Patient complains of not having a BM since admission but that he "leaks" out of his rectum.  Administered fleets enema per MD orders.  Assisted patient to bathroom where large amount of stool resulted.  Brita Romp, RN

## 2016-01-22 NOTE — Progress Notes (Signed)
Speech Language Pathology Treatment: Cognitive-Linquistic  Patient Details Name: Patrick [name] MRN: [id number] DOB: November 24, 1955 Today's Date: 01/22/2016 Time: [id number] SLP Time Calculation (min) (ACUTE ONLY): 8 min  Assessment / Plan / Recommendation Clinical Impression  Skilled treatment session focused on addressing cognition goals. SLP facilitated session by providing extra time for patient to complete a familiar, mildly complex money problem solving task.  Patient utilize self-talk repetitions duirng task to achieve 100% accuracy.  Patient did not require redirection while working in a mildly distracting environment.  At end of session patient required Supervision level verbal cues for recall of time of last administered pain medication.  Continue with current plan of care.     HPI HPI: The patient is a 60 year old white male who was intoxicated with an alcohol level of 285. He fell down some steps landing on his face. Workup included a head CT which demonstrated multiple facial and basilar skull fractures as well as a traumatic subarachnoid hemorrhage. There is fluid in the ethmoid and sphenoid sinuses. Pt is at risk for a CSF leak. CT includes palate and TMJ fractures with maxillary sinus, liquid only diet: no nose blowing, no chewing.      SLP Plan  Continue with current plan of care     Recommendations                   Oral Care Recommendations: Oral care BID Follow up Recommendations: Inpatient Rehab Plan: Continue with current plan of care       GO              Carmelia Roller., CCC-SLP [id number]   [place] 01/22/2016, 10:44 AM
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
## 2016-01-23 ENCOUNTER — Inpatient Hospital Stay (HOSPITAL_COMMUNITY): Payer: Managed Care, Other (non HMO) | Admitting: Speech Pathology

## 2016-01-23 ENCOUNTER — Inpatient Hospital Stay (HOSPITAL_COMMUNITY): Payer: Managed Care, Other (non HMO) | Admitting: Physical Therapy

## 2016-01-23 ENCOUNTER — Inpatient Hospital Stay (HOSPITAL_COMMUNITY): Payer: Self-pay | Admitting: Occupational Therapy

## 2016-01-23 DIAGNOSIS — S02101S Fracture of base of skull, right side, sequela: Secondary | ICD-10-CM

## 2016-01-23 LAB — CBC WITH DIFFERENTIAL/PLATELET
BASOS PCT: 0 %
Basophils Absolute: 0.1 10*3/uL (ref 0.0–0.1)
EOS ABS: 0.1 10*3/uL (ref 0.0–0.7)
EOS PCT: 1 %
HCT: 40.2 % (ref 39.0–52.0)
Hemoglobin: 13.3 g/dL (ref 13.0–17.0)
LYMPHS ABS: 1.6 10*3/uL (ref 0.7–4.0)
Lymphocytes Relative: 14 %
MCH: 29.2 pg (ref 26.0–34.0)
MCHC: 33.1 g/dL (ref 30.0–36.0)
MCV: 88.4 fL (ref 78.0–100.0)
MONOS PCT: 7 %
Monocytes Absolute: 0.8 10*3/uL (ref 0.1–1.0)
NEUTROS PCT: 78 %
Neutro Abs: 9 10*3/uL — ABNORMAL HIGH (ref 1.7–7.7)
PLATELETS: 395 10*3/uL (ref 150–400)
RBC: 4.55 MIL/uL (ref 4.22–5.81)
RDW: 12.9 % (ref 11.5–15.5)
WBC: 11.5 10*3/uL — AB (ref 4.0–10.5)

## 2016-01-23 LAB — COMPREHENSIVE METABOLIC PANEL
ALBUMIN: 3.4 g/dL — AB (ref 3.5–5.0)
ALT: 26 U/L (ref 17–63)
ANION GAP: 11 (ref 5–15)
AST: 18 U/L (ref 15–41)
Alkaline Phosphatase: 81 U/L (ref 38–126)
BUN: 15 mg/dL (ref 6–20)
CHLORIDE: 96 mmol/L — AB (ref 101–111)
CO2: 27 mmol/L (ref 22–32)
Calcium: 9.7 mg/dL (ref 8.9–10.3)
Creatinine, Ser: 0.87 mg/dL (ref 0.61–1.24)
GFR calc non Af Amer: >60 mL/min (ref >60)
GLUCOSE: 124 mg/dL — AB (ref 65–99)
POTASSIUM: 3.9 mmol/L (ref 3.5–5.1)
SODIUM: 134 mmol/L — AB (ref 135–145)
Total Bilirubin: 0.6 mg/dL (ref 0.3–1.2)
Total Protein: 7 g/dL (ref 6.5–8.1)

## 2016-01-23 NOTE — Progress Notes (Signed)
[facility] PHYSICAL MEDICINE & REHABILITATION     PROGRESS NOTE    Subjective/Complaints: Having headaches still. Slept ok. Feels tired. Still can't believe "this happened"  ROS: Pt denies fever, rash/itching,   blurred or double vision, nausea, vomiting, abdominal pain, diarrhea, chest pain, shortness of breath, palpitations, dysuria, dizziness, neck or back pain, bleeding,   or depression   Objective: Vital Signs: Blood pressure 138/65, pulse 73, temperature 98.3 F (36.8 C), temperature source Oral, resp. rate 18, weight 79.7 kg (175 lb 11.3 oz), SpO2 99 %. No results found.  Recent Labs  01/22/16 2032  WBC 17.1*  HGB 13.4  HCT 40.3  PLT 367    Recent Labs  01/22/16 2032  NA 136  K 4.0  CL 95*  GLUCOSE 151*  BUN 12  CREATININE 0.87  CALCIUM 10.0   CBG (last 3)  No results for input(s): GLUCAP in the last 72 hours.  Wt Readings from Last 3 Encounters:  01/22/16 79.7 kg (175 lb 11.3 oz)  01/05/16 82.4 kg (181 lb 10.5 oz)    Physical Exam:  Constitutional: He is oriented to person, place, and time. He appears well-developed and well-nourished.  HENT:  Head: Normocephalic and atraumatic.  Eyes: Conjunctivae and EOM are normal. Pupils are equal, round, and reactive to light. Right eye exhibits no discharge. Left eye exhibits no discharge.  Bilateral orbital ecchymoses.   Neck:  Neck immobilized by collar  Respiratory: Effort normal and breath sounds normal. No respiratory distress. He has no wheezes.  GI: Soft. Bowel sounds are normal. He exhibits no distension. There is no tenderness.  Musculoskeletal: He exhibits no edema or tenderness.  Neurological: He is alert and oriented to person, place, and time. He displays normal reflexes.  Alert and appropriate. Left facial weakness and decrease left lid closure/brow movement. also with mild dysarthria due to dental bars. Decreased hearing left ear. He is able to follow commands without difficulty. A little  impulsive and tangential. UE strength grossly 5/5 with some pain inhibition proximally.l LE: 4+ hf and ke and 5/5 adf/pf. No sensory findings.   Skin: Skin is warm and dry. No erythema.  Psychiatric: He has a normal mood and affect. His behavior is normal. Thought content normal.   Assessment/Plan: 1. Functional, cognitive, and mobility deficits secondary to TBI with polytrauma which require 3+ hours per day of interdisciplinary therapy in a comprehensive inpatient rehab setting. Physiatrist is providing close team supervision and 24 hour management of active medical problems listed below. Physiatrist and rehab team continue to assess barriers to discharge/monitor patient progress toward functional and medical goals.  Function:  Bathing Bathing position      Bathing parts      Bathing assist        Upper Body Dressing/Undressing Upper body dressing                    Upper body assist        Lower Body Dressing/Undressing Lower body dressing                                  Lower body assist        Toileting Toileting   Toileting steps completed by patient: Performs perineal hygiene Toileting steps completed by helper: Performs perineal hygiene, Adjust clothing after toileting Toileting Assistive Devices: Grab bar or rail  Toileting assist Assist level: Touching or steadying assistance (Pt.75%)  Transfers Chair/bed Physiological scientist Comprehension Comprehension assist level: Follows complex conversation/direction with no assist  Expression Expression assist level: Expresses complex ideas: With no assist  Social Interaction Social Interaction assist level: Interacts appropriately with others - No medications needed.  Problem Solving Problem solving assist level: Solves complex problems: Recognizes & self-corrects  Memory Memory assist level: Complete Independence: No helper    Medical Problem List and Plan: 1.  Functional, mobility, and cognitive deficits secondary to TBI/polytrauma after fall down stairs             -beginning therapies today 2.  DVT Prophylaxis/Anticoagulation: Mechanical: Sequential compression devices, below knee Bilateral lower extremities and mobility  3. HA/Pain Management: Uncontrolled on current regimen--Fentanyl 50 mcg,  hydrocodone 20 mg 4-5 times daily  And prn IV dilaudid.   -continue Topamax for headaches 4. Mood: LCSW to follow for evaluation and support.  5. Neuropsych: This patient is not fully capable of making decisions on his own behalf. 6. Skin/Wound Care: Routine pressure relief measures.  7. Fluids/Electrolytes/Nutrition: Monitor I/O. Continue liquid diet.  8. Leucocytosis: labs pending for today.  9. Constipation: Add Miralax bid today and repeat enema.  10. Urinary retention?: ua/cx pending  -Continue urecholine and flomax. 11. Hyponatremia: Recheck labs pending  12. Impaired fating glucose: Will check Hgb A1c.  13. Facial fractures/TMJ fracture s/p maxillomandibular fixation: Liquid diet. HOB elevated as able and No nose blowing.              -likely left CN VII injury, ?VII injury also 14. HTN: Blood pressures elevated likely due to pain.   -improved today  .LOS (Days) 1 A FACE TO FACE EVALUATION WAS PERFORMED  Noemy Hallmon T 01/23/2016 9:35 AM

## 2016-01-23 NOTE — Plan of Care (Signed)
Problem: RH Ambulation Goal: LTG Patient will ambulate in controlled environment (PT) LTG: Patient will ambulate in a controlled environment, # of feet with assistance (PT).  150 ft with LRAD

## 2016-01-23 NOTE — Progress Notes (Signed)
Speech Language Pathology Daily Session Note  Patient Details  Name: Patrick [name] MRN: [id number] Date of Birth: 1956-01-14  Today's Date: 01/23/2016 SLP Individual Time: 1400-1500 SLP Individual Time Calculation (min): 60 min   Short Term Goals: Week 1:  see eval  Skilled Therapeutic Interventions:Pt seen for skilled treatment focusing on cognitive goals. Problem solving and sequencing in functional toileting task was limited and required assist for decision-making. Functional math moderately complex word problems completed with ~90% acc with increased time and occasion repetition of details secondary to difficulty with working memory.    Function:  Eating Eating     Eating Assist Level: Set up assist for (can only drink liquids (no soilds)- uses straw)           Cognition Comprehension Comprehension assist level: Understands basic 90% of the time/cues < 10% of the time  Expression   Expression assist level: Expresses basic needs/ideas: With extra time/assistive device  Social Interaction Social Interaction assist level: Interacts appropriately 75 - 89% of the time - Needs redirection for appropriate language or to initiate interaction.  Problem Solving Problem solving assist level: Solves basic 75 - 89% of the time/requires cueing 10 - 24% of the time  Memory Memory assist level: Recognizes or recalls 75 - 89% of the time/requires cueing 10 - 24% of the time    Pain Pain Assessment Pain Assessment: 0-10 Pain Score: 6  Pain Type: Acute pain Pain Location: Neck Pain Orientation: Left Pain Descriptors / Indicators: Radiating Pain Frequency: Intermittent Pain Onset: On-going Patients Stated Pain Goal: 3 Pain Intervention(s): Medication (See eMAR)  Therapy/Group: Individual Therapy  Vinetta Bergamo MA, CCC-SLP 01/23/2016, 2:59 PM

## 2016-01-23 NOTE — Progress Notes (Signed)
Patient information reviewed and entered into eRehab system by Collie Kittel, RN, CRRN, PPS Coordinator.  Information including medical coding and functional independence measure will be reviewed and updated through discharge.    

## 2016-01-23 NOTE — Progress Notes (Addendum)
Physical Therapy Session Note  Patient Details  Name: Patrick [name] MRN: [id number] Date of Birth: Feb 22, 1956  Today's Date: 01/23/2016 PT Individual Time: [id number]  PT Individual Time Calculation (min): 29 min    Short Term Goals: Week 1:  PT Short Term Goal 1 (Week 1): STG = LTG due to short ELOS.  Skilled Therapeutic Interventions/Progress Updates:  Patient received in bed noting 9/10 headache, RN made aware and notes pt is premedicated. Discussed potential for family/friends to bring pt clothes so he can dress for therapy sessions & pt voiced understanding. Gait training x 150 ft x 2 room<>gym without AD and steady assist with occasional fade to supervision; pt demonstrates significantly long stride length BLE. Pt performed Berg Balance Test & scored 50/56. Patient demonstrates increased fall risk as noted by score of 50/56 on Berg Balance Scale.  (<36= high risk for falls, close to 100%; 37-45 significant >80%; 46-51 moderate >50%; 52-55 lower >25%); educated pt on interpretation of score & importance of calling for assistance when wanting to transfer OOB to reduce risk of falls & pt voiced understanding. At end of session pt left sitting on EOB with dinner tray & all needs within reach.   While ambulating back to room pt noted dizziness; BP = 151/89 mmHg.    Therapy Documentation Precautions:  Precautions Precautions: Fall, Cervical Precaution Comments: no nose blowing Required Braces or Orthoses: Cervical Brace Cervical Brace: Hard collar, At all times Restrictions Weight Bearing Restrictions: No  Balance: Balance Balance Assessed: Yes Standardized Balance Assessment Standardized Balance Assessment: Berg Balance Test Berg Balance Test Sit to Stand: Able to stand without using hands and stabilize independently Standing Unsupported: Able to stand safely 2 minutes Sitting with Back Unsupported but Feet Supported on Floor or Stool: Able to sit safely and securely 2  minutes Stand to Sit: Sits safely with minimal use of hands Transfers: Able to transfer safely, minor use of hands Standing Unsupported with Eyes Closed: Able to stand 10 seconds safely Standing Ubsupported with Feet Together: Able to place feet together independently and stand for 1 minute with supervision From Standing, Reach Forward with Outstretched Arm: Can reach confidently >25 cm (10") From Standing Position, Pick up Object from Floor: Able to pick up shoe, needs supervision From Standing Position, Turn to Look Behind Over each Shoulder: Looks behind from both sides and weight shifts well Turn 360 Degrees: Able to turn 360 degrees safely one side only in 4 seconds or less Standing Unsupported, Alternately Place Feet on Step/Stool: Able to complete 4 steps without aid or supervision (completes 8 steps <20 seconds but requires supervision) Standing Unsupported, One Foot in Front: Able to place foot tandem independently and hold 30 seconds (with close supervision) Standing on One Leg: Able to lift leg independently and hold 5-10 seconds (9 seconds with close supervision) Total Score: 50   See Function Navigator for Current Functional Status.   Therapy/Group: Individual Therapy  Waunita Schooner 01/23/2016, 5:13 PM

## 2016-01-23 NOTE — Evaluation (Signed)
Speech Language Pathology Assessment and Plan  Patient Details  Name: Patrick [name] MRN: [id number] Date of Birth: April 23, 1955  SLP Diagnosis: Cognitive Impairments  Rehab Potential: Excellent ELOS: 10/26    Today's Date: 01/23/2016 SLP Individual Time: 1572-6203 SLP Individual Time Calculation (min): 58 min    Problem List:  Patient Active Problem List   Diagnosis Date Noted  . Diffuse TBI w loss of consciousness of unsp duration, init ([place]) 01/22/2016  . Cranial nerve VII palsy 01/22/2016  . Fall 01/10/2016  . Fracture of skull base ([place]) 01/10/2016  . Multiple facial fractures ([place]) 01/10/2016  . Alcohol abuse 01/10/2016  . Fracture of right occipital condyle ([place]) 01/10/2016  . Subarachnoid hemorrhage following injury with brief loss of consciousness but without open intracranial wound ([place]) 01/05/2016   Past Medical History:  Past Medical History:  Diagnosis Date  . Coronary artery disease    moderate  . Hyperlipidemia   . TIA (transient ischemic attack)    Past Surgical History:  Past Surgical History:  Procedure Laterality Date  . ORIF MANDIBULAR FRACTURE N/A 01/07/2016   Procedure: OPEN REDUCTION INTERNAL FIXATION (ORIF) MANDIBULAR FRACTURE;  Surgeon: Loel Lofty Dillingham, DO;  Location: [place];  Service: Plastics;  Laterality: N/A;  . PLACEMENT OF LUMBAR DRAIN N/A 01/14/2016   Procedure: PLACEMENT OF LUMBAR DRAIN;  Surgeon: Newman Pies, MD;  Location: [place];  Service: Neurosurgery;  Laterality: N/A;    Assessment / Plan / Recommendation Clinical Impression Patientis a 60 y.o.malewho fell down 7 stairs and landed on his face.  Patient intoxicated with amnesia of events and ETOH level 285. He was evaluated in ED on 01/05/16 and CT head done showing multiple facial fractures, basilar skull fracture and acute SAH/IPH along anterior frontal region. Dr. Arnoldo Morale evaluated patient and recommended monitoring as patient at risk for CSF bleed. CT cervical spine  revealed minimally displaced right occipital condyle fracture and nondisplaced left occipital condyle fracture, endplate fracture C3 and C6 with gas in spinal canal.  To continue aspen collar for support. He underwent maxillomandibular fixation by Dr. Marla Roe on 10/2 and to continue on liquid diet. He developed severe HA as well as clear drainage from nares when up due to CSF leak. He was placed on bedrest without resolution of symptoms and CT head done 10/9 showing resolving IPH with significant left frontotemporal edema, layering fluid/complete opacification of L>R sphenoid sinus most likely area of CSF leak.  He was taken to OR that day for placement of lumbar drain by Dr. Arnoldo Morale.  Lumbar drain removed yesterday and therapy resumed. Mentation has improved but he continues to have severe HA.  Foley discontinued today.   Therapy ongoing and CIR recommended due to cognitive deficits, impulsivity, poor safety due to TBI. Patient admitted to [facility] 01/22/16.   Patient administered the Assessment of Language Related Functional Activities (ALFA) and scored between 80% and 100% accuracy in all domains, which is considered functionally independent in all tasks. Although patient is high-functioning in a testing environment, patient demonstrated deficits in the domains of intellectual awareness, complex problem solving, short term recall, and decreased awareness of personal safety with functional tasks. Patient also demonstrated decreased social pragmatic awareness. Patient would benefit from skilled SLP intervention to maximize his cognitive function and overall independence prior to discharge.   Skilled Therapeutic Interventions          Patient administered cognitive-linguistic evaluation, please see above for details.  SLP Assessment  Patient will need skilled [facility] Services during  CIR admission    Recommendations  Oral Care Recommendations: Oral care BID Recommendations for Other  Services: Neuropsych consult Patient destination: Home Follow up Recommendations: Outpatient SLP;24 hour supervision/assistance (TBD) Equipment Recommended: None recommended by SLP    SLP Frequency 3 to 5 out of 7 days   SLP Duration  SLP Intensity  SLP Treatment/Interventions 10/26  Minumum of 1-2 x/day, 30 to 90 minutes  Cognitive remediation/compensation;Cueing hierarchy;Internal/external aids;Patient/family education;Therapeutic Activities;Functional tasks    Pain Pain Assessment Pain Assessment: No/denies pain  Function:  Cognition Comprehension Comprehension assist level: Understands basic 90% of the time/cues < 10% of the time  Expression   Expression assist level: Expresses basic 90% of the time/requires cueing < 10% of the time.  Social Interaction Social Interaction assist level: Interacts appropriately 75 - 89% of the time - Needs redirection for appropriate language or to initiate interaction.  Problem Solving Problem solving assist level: Solves basic 75 - 89% of the time/requires cueing 10 - 24% of the time  Memory Memory assist level: Recognizes or recalls 75 - 89% of the time/requires cueing 10 - 24% of the time   Short Term Goals: Week 1: SLP Short Term Goal 1 (Week 1): Patient will identify 2 cognitive deficits and their impact on personal safety given supervision question cues. SLP Short Term Goal 2 (Week 1): Patient will demonstrate short-term recall of new, daily information given supervision question cues. SLP Short Term Goal 3 (Week 1): Patient will demonstrate mildly complex problem solving given supervision verbal cues.   Refer to Care Plan for Long Term Goals  Recommendations for other services: Neuropsych  Discharge Criteria: Patient will be discharged from SLP if patient refuses treatment 3 consecutive times without medical reason, if treatment goals not met, if there is a change in medical status, if patient makes no progress towards goals or if  patient is discharged from hospital.  The above assessment, treatment plan, treatment alternatives and goals were discussed and mutually agreed upon: by patient  Thornton Papas 01/23/2016, 5:00 PM

## 2016-01-23 NOTE — Patient Care Conference (Signed)
Inpatient RehabilitationTeam Conference and Plan of Care Update Date: 01/23/2016   Time: 2:40 PM    Patient Name: Patrick [name]      Medical Record Number: [id number]  Date of Birth: 19-May-1955 Sex: Male         Room/Bed: 4W05C/4W05C-01 Payor Info: Payor: [employer] / Plan: [employer] MANAGED / Product Type: *No Product type* /    Admitting Diagnosis: TBI  Admit Date/Time:  01/22/2016  3:21 PM Admission Comments: No comment available   Primary Diagnosis:  Diffuse TBI w loss of consciousness of unsp duration, init ([facility]) Principal Problem: Diffuse TBI w loss of consciousness of unsp duration, init ([place])  Patient Active Problem List   Diagnosis Date Noted  . Diffuse TBI w loss of consciousness of unsp duration, init ([place]) 01/22/2016  . Cranial nerve VII palsy 01/22/2016  . Fall 01/10/2016  . Fracture of skull base ([place]) 01/10/2016  . Multiple facial fractures ([place]) 01/10/2016  . Alcohol abuse 01/10/2016  . Fracture of right occipital condyle ([place]) 01/10/2016  . Subarachnoid hemorrhage following injury with brief loss of consciousness but without open intracranial wound ([place]) 01/05/2016    Expected Discharge Date: Expected Discharge Date: 01/31/16  Team Members Present: Physician leading conference: Dr. Delice Lesch Social Worker Present: Lennart Pall, LCSW Nurse Present: Heather Roberts, RN PT Present: Jorge Mandril, PT OT Present: Willeen Cass, Rhetta Mura, OT PPS Coordinator present : Daiva Nakayama, RN, CRRN     Current Status/Progress Goal Weekly Team Focus  Medical   Functional, mobility, and cognitive deficits secondary to TBI/polytrauma after fall down stairs  Improve safety, cognition, headaches, labs, bladder function  See above   Bowel/Bladder   continent of bowel and bladder. LBM 01/24/16  Remain continent with independence  supervision   Swallow/Nutrition/ Hydration             ADL's   min A overall   mod I overall  processing, sequencing, emergent awareness,  functional problem solving and functional mobility   Mobility   min guard overall without AD, poor insight into functional tasks & safety, impaired problem solving  supervision/mod I overall  pt education, safety awareness, problem solving, gait training, balance   Communication             Safety/Cognition/ Behavioral Observations  Min A  Supervision  working memory, complex problem solving, awareness   Pain   8/10 with headache, Hycet 10-24mL q4hr prn   <4 on a 0-10 pain scale  assess pain q 4hr and prn   Skin   bruising to bilateral face, incision to lower back; clean and intack with gauze dressing  remain free from infection and breakdown while on rehab  assess skin q shift and prn    Rehab Goals Patient on target to meet rehab goals: Yes *See Care Plan and progress notes for long and short-term goals.  Barriers to Discharge: Mobility, cogntition, headaches, leukocytosis, urinary retention, hyperglycemia    Possible Resolutions to Barriers:  Therapies, optimize headache meds, follow labs, Ua/Ucx    Discharge Planning/Teaching Needs:  Home with family who can, initally, provide 24/7 supervision (~one week)  Teaching to be scheduled.   Team Discussion:  New eval today.  mutiple facial and occipital fxs, TBI.  Poor complex problem solving.  C/o HA and bowel issues.  Hopeful can reach a mod independent level.  Revisions to Treatment Plan:  None   Continued Need for Acute Rehabilitation Level of Care: The patient requires daily medical management by a physician  with specialized training in physical medicine and rehabilitation for the following conditions: Daily direction of a multidisciplinary physical rehabilitation program to ensure safe treatment while eliciting the highest outcome that is of practical value to the patient.: Yes Daily medical management of patient stability for increased activity during participation in an intensive rehabilitation regime.: Yes Daily analysis of  laboratory values and/or radiology reports with any subsequent need for medication adjustment of medical intervention for : Neurological problems;Mood/behavior problems;Urological problems;Other  Kriti Katayama 01/24/2016, 10:57 AM

## 2016-01-23 NOTE — Plan of Care (Signed)
Problem: RH Ambulation Goal: LTG Patient will ambulate in home environment (PT) LTG: Patient will ambulate in home environment, # of feet with assistance (PT).  83 ft with LRAD

## 2016-01-23 NOTE — Evaluation (Signed)
Physical Therapy Assessment and Plan  Patient Details  Name: Arlando Leisinger MRN: [id number] Date of Birth: 1955/10/17  PT Diagnosis: Abnormality of gait, Difficulty walking, Impaired cognition and Muscle weakness Rehab Potential: Good ELOS: 7-10 days   Today's Date: 01/23/2016 PT Individual Time: 0903-1001 PT Individual Time Calculation (min): 58 min     Problem List:  Patient Active Problem List   Diagnosis Date Noted  . Diffuse TBI w loss of consciousness of unsp duration, init ([place]) 01/22/2016  . Cranial nerve VII palsy 01/22/2016  . Fall 01/10/2016  . Fracture of skull base ([place]) 01/10/2016  . Multiple facial fractures ([place]) 01/10/2016  . Alcohol abuse 01/10/2016  . Fracture of right occipital condyle ([place]) 01/10/2016  . Subarachnoid hemorrhage following injury with brief loss of consciousness but without open intracranial wound ([place]) 01/05/2016    Past Medical History:  Past Medical History:  Diagnosis Date  . Coronary artery disease    moderate  . Hyperlipidemia   . TIA (transient ischemic attack)    Past Surgical History:  Past Surgical History:  Procedure Laterality Date  . ORIF MANDIBULAR FRACTURE N/A 01/07/2016   Procedure: OPEN REDUCTION INTERNAL FIXATION (ORIF) MANDIBULAR FRACTURE;  Surgeon: Loel Lofty Dillingham, DO;  Location: [place];  Service: Plastics;  Laterality: N/A;  . PLACEMENT OF LUMBAR DRAIN N/A 01/14/2016   Procedure: PLACEMENT OF LUMBAR DRAIN;  Surgeon: Newman Pies, MD;  Location: [place];  Service: Neurosurgery;  Laterality: N/A;    Assessment & Plan Clinical Impression: Patient is a 60 y.o. year old malewho fell down 7 stairs and landed on his face.  Patient intoxicated with amnesia of events and ETOH level 285. He was evaluated in ED on 01/05/16 and CT head done showing multiple facial fractures, basilar skull fracture and acute SAH/IPH along anterior frontal region. Dr. Arnoldo Morale evaluated patient and recommended monitoring as patient at risk  for CSF bleed. CT cervical spine revealed minimally displaced right occipital condyle fracture and nondisplaced left occipital condyle fracture, endplate fracture C3 and C6 with gas in spinal canal.  To continue aspen collar for support. He underwent maxillomandibular fixation by Dr. Marla Roe on 10/2 and to continue on liquid diet.   He developed severe HA as well as clear drainage from nares when up due to CSF leak. He was placed on bedrest without resolution of symptoms and CT head done 10/9 showing resolving IPH with significant left frontotemporal edema, layering fluid/complete opacification of L>R sphenoid sinus most likely area of CSF leak.  He was taken to OR that day for placement of lumbar drain by Dr. Arnoldo Morale.  Lumbar drain removed yesterday and therapy resumed. Mentation has improved but he continues to have severe HA.  Foley discontinued today.   Therapy ongoing and CIR recommended due to cognitive deficits, impulsivity, poor safety due to TBI.  Patient transferred to CIR on 01/22/2016 .   Patient currently requires min with mobility secondary to muscle weakness, decreased visual acuity, decreased attention, decreased awareness, decreased problem solving, decreased safety awareness and decreased memory and decreased standing balance, decreased postural control, decreased balance strategies and difficulty maintaining precautions.  Prior to hospitalization, patient was independent  with mobility and lived with Spouse Quintin Alto) in a House home.  Home access is 2Stairs to enter.  Patient will benefit from skilled PT intervention to maximize safe functional mobility, minimize fall risk and decrease caregiver burden for planned discharge home with 24 hour supervision.  Anticipate patient will benefit from follow up OP at discharge.  PT - End of Session Activity Tolerance: Tolerates 30+ min activity with multiple rests Endurance Deficit: Yes Endurance Deficit Description:  (2/2 fatigue) PT  Assessment Rehab Potential (ACUTE/IP ONLY): Good Barriers to Discharge: Decreased caregiver support PT Patient demonstrates impairments in the following area(s): Balance;Behavior;Endurance;Safety;Pain;Sensory;Motor PT Transfers Functional Problem(s): Bed Mobility;Bed to Chair;Furniture;Floor;Car PT Locomotion Functional Problem(s): Ambulation;Stairs PT Plan PT Intensity: Minimum of 1-2 x/day ,45 to 90 minutes PT Frequency: 5 out of 7 days PT Duration Estimated Length of Stay: 7-10 days PT Treatment/Interventions: Ambulation/gait training;Community reintegration;DME/adaptive equipment instruction;Neuromuscular re-education;Psychosocial support;Stair training;UE/LE Strength taining/ROM;UE/LE Coordination activities;Therapeutic Activities;Pain management;Discharge planning;Balance/vestibular training;Cognitive remediation/compensation;Disease management/prevention;Functional mobility training;Patient/family education;Therapeutic Exercise;Visual/perceptual remediation/compensation PT Transfers Anticipated Outcome(s): supervision PT Locomotion Anticipated Outcome(s): supervision PT Recommendation Recommendations for Other Services: Speech consult Follow Up Recommendations: Outpatient PT;24 hour supervision/assistance Patient destination: Home Equipment Recommended: To be determined  Skilled Therapeutic Intervention Pt received in recliner & PT evaluation initiated. Educated pt on safety plan, therapy schedule, weekly CIR interdisciplinary team meetings, and ELOS. Educated pt on cervical precautions & precaution to not blow nose & pt verbalized understanding. Pt ambulated ~250 ft without AD & min assist on this date, ambulated over uneven surfaces, negotiated 12 steps with single rail and performed car transfer with min guard overall. Pt does present with decreased safety awareness and incontinence of bowels. Throughout session pt c/o HA & RN made aware. Pt also noted issues with bowels, as if he  needed to use restroom but was unable to & pt performed toilet transfer with min guard assistance. At end of session pt left sitting on EOB with all needs within reach & bed alarm set. Pt return demonstrated proper use of call bell.   During evaluation pt reported being hot, transferred to supine & BP = 128/84 mmHg. After resting pt noted symptoms had ceased.    PT Evaluation Precautions/Restrictions Precautions Precautions: Fall;Cervical Precaution Comments:  (NO nose blowing) Required Braces or Orthoses: Cervical Brace Cervical Brace: Hard collar;At all times   General Chart Reviewed: Yes Response to Previous Treatment: Patient reporting fatigue but able to participate. Family/Caregiver Present: No   Vital Signs Therapy Vitals Pulse Rate: 79 BP: 128/84 Patient Position (if appropriate): Lying  Pain Pain Assessment Pain Assessment: 0-10 Pain Score: 8  Pain Location: Head Pain Intervention(s):  (pt reported he had already requested pain medication)   Home Living/Prior Functioning Home Living Available Help at Discharge: Family;Available PRN/intermittently (pt reports wife works 2-3days/week & step daughter attends college but lives with pt) Type of Home: House Home Access: Stairs to enter [employer] of Steps: 2 Entrance Stairs-Rails: None Home Layout: Two level;Able to live on main level with bedroom/bathroom  Lives With: Spouse Quintin Alto) Prior Function Level of Independence: Independent with basic ADLs;Independent with homemaking with ambulation;Independent with transfers;Independent with gait  Able to Take Stairs?: Yes Driving: Yes Vocation: Full time employment (works for [employer]) Biomedical scientist:  (Orthoptist) Leisure: Hobbies-yes (Comment) Comments:  (goes to gym (running, lifting weights), hiking mountains)   Vision/Perception  Pt reports he wears glasses for reading only at baseline.  Pt notes "fuzzy" vision in L eye  following hospital admission.  Cognition Overall Cognitive Status: Impaired/Different from baseline Arousal/Alertness: Awake/alert Orientation Level: Oriented to person;Oriented to place;Oriented to time;Oriented to situation Problem Solving: Impaired Problem Solving Impairment: Functional basic Reasoning: Impaired Safety/Judgment: Impaired   Sensation Sensation Light Touch: Appears Intact (BLE) Proprioception: Appears Intact (BLE) Coordination Gross Motor Movements are Fluid and Coordinated: Yes   Motor  Motor Motor:  (general weakness)  Mobility Bed Mobility Bed Mobility: Rolling Right;Rolling Left;Supine to Sit;Sit to Supine (mat table) Rolling Right: 5: Supervision Rolling Left: 5: Supervision Supine to Sit: 5: Supervision Sit to Supine: 5: Supervision Transfers Transfers: Yes Sit to Stand: 4: Min guard Stand to Sit: 4: Min guard   Locomotion  Ambulation Ambulation: Yes Ambulation/Gait Assistance: 4: Min guard Ambulation Distance (Feet): 250 Feet Assistive device: None Gait Gait: Yes Gait Pattern:  (inconsistent step length BLE, lateral sway) Stairs / Additional Locomotion Stairs: Yes Stairs Assistance: 4: Min guard Stair Management Technique: One rail Right Number of Stairs: 12 Height of Stairs: 6 (inches) Ramp: 4: Min Administrator Mobility: No   Extremity Assessment  RLE Assessment RLE Assessment: Within Functional Limits (grossly 4/5, tremors with full R knee extension) LLE Assessment LLE Assessment: Within Functional Limits (grossly 4/5)   See Function Navigator for Current Functional Status.   Refer to Care Plan for Long Term Goals  Recommendations for other services: Other: Speech Therapy  Discharge Criteria: Patient will be discharged from PT if patient refuses treatment 3 consecutive times without medical reason, if treatment goals not met, if there is a change in medical status, if patient makes no progress  towards goals or if patient is discharged from hospital.  The above assessment, treatment plan, treatment alternatives and goals were discussed and mutually agreed upon: by patient  Waunita Schooner 01/23/2016, 10:20 AM

## 2016-01-23 NOTE — Evaluation (Signed)
Occupational Therapy Assessment and Plan  Patient Details  Name: Patrick [name] MRN: [id number] Date of Birth: 11-16-1955  OT Diagnosis: acute pain, cognitive deficits and muscle weakness (generalized) Rehab Potential: Rehab Potential (ACUTE ONLY): Good ELOS: ~9 days   Today's Date: 01/23/2016 OT Individual Time: 1100-1200 OT Individual Time Calculation (min): 60 min      Problem List: Patient Active Problem List   Diagnosis Date Noted  . Diffuse TBI w loss of consciousness of unsp duration, init ([place]) 01/22/2016  . Cranial nerve VII palsy 01/22/2016  . Fall 01/10/2016  . Fracture of skull base ([place]) 01/10/2016  . Multiple facial fractures ([place]) 01/10/2016  . Alcohol abuse 01/10/2016  . Fracture of right occipital condyle ([place]) 01/10/2016  . Subarachnoid hemorrhage following injury with brief loss of consciousness but without open intracranial wound ([place]) 01/05/2016    Past Medical History:  Past Medical History:  Diagnosis Date  . Coronary artery disease    moderate  . Hyperlipidemia   . TIA (transient ischemic attack)    Past Surgical History:  Past Surgical History:  Procedure Laterality Date  . ORIF MANDIBULAR FRACTURE N/A 01/07/2016   Procedure: OPEN REDUCTION INTERNAL FIXATION (ORIF) MANDIBULAR FRACTURE;  Surgeon: Loel Lofty Dillingham, DO;  Location: [facility];  Service: Plastics;  Laterality: N/A;  . PLACEMENT OF LUMBAR DRAIN N/A 01/14/2016   Procedure: PLACEMENT OF LUMBAR DRAIN;  Surgeon: Newman Pies, MD;  Location: [facility];  Service: Neurosurgery;  Laterality: N/A;    Assessment & Plan Clinical Impression: Patient is a 60 y.o. year old male who fell down 7 stairs and landed on his face.  Patient intoxicated with amnesia of events and ETOH level 285. He was evaluated in ED on 01/05/16 and CT head done showing multiple facial fractures, basilar skull fracture and acute SAH/IPH along anterior frontal region. Dr. Arnoldo Morale evaluated patient and recommended monitoring as  patient at risk for CSF bleed. CT cervical spine revealed minimally displaced right occipital condyle fracture and nondisplaced left occipital condyle fracture, endplate fracture C3 and C6 with gas in spinal canal.  To continue aspen collar for support. He underwent maxillomandibular fixation by Dr. Marla Roe on 10/2 and to continue on liquid diet.   He developed severe HA as well as clear drainage from nares when up due to CSF leak. He was placed on bedrest without resolution of symptoms and CT head done 10/9 showing resolving IPH with significant left frontotemporal edema, layering fluid/complete opacification of L>R sphenoid sinus most likely area of CSF leak.  He was taken to OR that day for placement of lumbar drain by Dr. Arnoldo Morale.  Lumbar drain removed yesterday and therapy resumed. Mentation has improved but he continues to have severe HA.  Foley discontinued today. Patient transferred to CIR on 01/22/2016 .    Patient currently requires min with basic self-care skills and functional mobility secondary to muscle weakness, decreased cardiorespiratoy endurance, decreased attention, decreased awareness, decreased problem solving, decreased safety awareness, decreased memory and delayed processing and decreased standing balance, decreased balance strategies and difficulty maintaining precautions and acute pain.  Prior to hospitalization, patient could complete ADL with independent .  Patient will benefit from skilled intervention to decrease level of assist with basic self-care skills and increase independence with basic self-care skills prior to discharge home with care partner.  Anticipate patient will require intermittent supervision and follow up outpatient.  OT - End of Session Activity Tolerance: Tolerates 30+ min activity with multiple rests Endurance Deficit: Yes Endurance Deficit Description:  secondary to fatigue and pain OT Assessment Rehab Potential (ACUTE ONLY): Good OT Patient  demonstrates impairments in the following area(s): Balance;Safety;Behavior;Sensory;Cognition;Edema;Endurance;Motor;Nutrition;Pain OT Basic ADL's Functional Problem(s): Eating;Grooming;Bathing;Dressing;Toileting OT Advanced ADL's Functional Problem(s): Simple Meal Preparation OT Transfers Functional Problem(s): Toilet;Tub/Shower OT Additional Impairment(s): None OT Plan OT Intensity: Minimum of 1-2 x/day, 45 to 90 minutes OT Frequency: 5 out of 7 days OT Duration/Estimated Length of Stay: ~9 days OT Treatment/Interventions: Balance/vestibular training;Discharge planning;Functional electrical stimulation;Pain management;Self Care/advanced ADL retraining;UE/LE Coordination activities;Therapeutic Activities;Visual/perceptual remediation/compensation;Therapeutic Exercise;Skin care/wound managment;Patient/family education;Functional mobility training;Disease mangement/prevention;Cognitive remediation/compensation;Community reintegration;DME/adaptive equipment instruction;Neuromuscular re-education;Psychosocial support;Splinting/orthotics;UE/LE Strength taining/ROM OT Self Feeding Anticipated Outcome(s): mod I  OT Basic Self-Care Anticipated Outcome(s): mod I  OT Toileting Anticipated Outcome(s): mod I  OT Bathroom Transfers Anticipated Outcome(s): mod I  OT Recommendation Recommendations for Other Services: Neuropsych consult Patient destination: Home Follow Up Recommendations: Outpatient OT Equipment Recommended: To be determined   Skilled Therapeutic Intervention Ot eval initiated with Ot goals, purpose and role discussed with pt. Self care retraining at shower level with focus on functional mobility around the room withotu AD with steadying. Pt with incontinent bowel leakage in brief today and required A to clean up. Pt able to shower with min A sit to stand. Pt with difficulty with functional problem solving, emergent to anticipatory awareness needing min instructional cues for awareness and  problem solving. C collar pads changed after showering in supine with total A. Pt with ongoing HA which also impact his engagement and concentration with functional tasks. Oral care with suction toothbrush with steadying A for control of device. Pt left in bed to rest after therapy.   OT Evaluation Precautions/Restrictions  Precautions Precautions: Fall;Cervical Precaution Comments: no nose blowing Required Braces or Orthoses: Cervical Brace Cervical Brace: Hard collar;At all times Restrictions Weight Bearing Restrictions: No General Chart Reviewed: Yes Family/Caregiver Present: No Vital Signs Therapy Vitals Temp: 98.3 F (36.8 C) Temp Source: Oral Pulse Rate: 85 Resp: 16 BP: 131/80 Patient Position (if appropriate): Lying Pain Pain Assessment Pain Assessment: 0-10 Pain Score: 6  Pain Type: Acute pain Pain Location: Neck Pain Orientation: Left Pain Descriptors / Indicators: Radiating Pain Frequency: Intermittent Pain Onset: On-going Patients Stated Pain Goal: 3 Pain Intervention(s): Medication (See eMAR) Home Living/Prior Functioning Home Living Family/patient expects to be discharged to:: Private residence Living Arrangements: Spouse/significant other Available Help at Discharge: Family, Available PRN/intermittently Type of Home: House Home Access: Stairs to enter Technical brewer of Steps: 2 Entrance Stairs-Rails: None Home Layout: Two level, Able to live on main level with bedroom/bathroom Bathroom Shower/Tub: Multimedia programmer: Standard Bathroom Accessibility: Yes  Lives With: Spouse Prior Function Level of Independence: Independent with basic ADLs, Independent with homemaking with ambulation, Independent with transfers, Independent with gait  Able to Take Stairs?: Yes Driving: Yes Vocation: Full time employment (works for [employer]) Biomedical scientist:  (Orthoptist) Leisure: Hobbies-yes (Comment) Comments:  (goes to  gym (running, lifting weights), hiking mountains) ADL ADL ADL Comments: see functional navigator Vision/Perception     Cognition Overall Cognitive Status: Impaired/Different from baseline Arousal/Alertness: Awake/alert Orientation Level: Person;Place;Situation Person: Oriented Place: Oriented Situation: Oriented Year: 2017 Month: October Day of Week: Incorrect (Tuesday) Memory: Impaired Memory Impairment: Storage deficit;Retrieval deficit;Decreased short term memory Decreased Short Term Memory: Verbal basic;Functional basic Immediate Memory Recall: Sock;Blue;Bed Memory Recall: Blue;Sock;Bed Memory Recall Sock: With Cue Memory Recall Blue: With Cue Memory Recall Bed: With Cue Attention: Focused;Sustained;Selective Focused Attention: Appears intact Sustained Attention: Appears intact Selective Attention: Impaired Selective Attention Impairment: Verbal basic;Functional basic  Awareness: Impaired Awareness Impairment: Emergent impairment;Anticipatory impairment Problem Solving: Impaired Problem Solving Impairment: Functional basic Reasoning: Impaired Reasoning Impairment: Functional basic Safety/Judgment: Impaired Rancho Los Amigos Scales of Cognitive Functioning: Automatic/appropriate Sensation Sensation Light Touch: Appears Intact Stereognosis: Appears Intact Hot/Cold: Appears Intact Proprioception: Appears Intact Coordination Gross Motor Movements are Fluid and Coordinated: Yes Fine Motor Movements are Fluid and Coordinated: Yes Motor  Motor Motor - Skilled Clinical Observations: generalized weakness; acute pain Mobility  Transfers Transfers: Sit to Stand;Stand to Sit Sit to Stand: 4: Min guard Stand to Sit: 4: Min guard  Trunk/Postural Assessment  Cervical Assessment Cervical Assessment:  (restricted in c collar) Thoracic Assessment Thoracic Assessment: Within Functional Limits Lumbar Assessment Lumbar Assessment: Within Functional Limits Postural  Control Postural Control:  (delayed righting reactions)  Balance Balance Balance Assessed: Yes Static Standing Balance Static Standing - Balance Support: During functional activity Static Standing - Level of Assistance: 4: Min assist;5: Stand by assistance Dynamic Standing Balance Dynamic Standing - Level of Assistance: 4: Min assist Extremity/Trunk Assessment RUE Assessment RUE Assessment: Within Functional Limits LUE Assessment LUE Assessment: Within Functional Limits   See Function Navigator for Current Functional Status.   Refer to Care Plan for Long Term Goals  Recommendations for other services: Neuropsych for return to work  Discharge Criteria: Patient will be discharged from OT if patient refuses treatment 3 consecutive times without medical reason, if treatment goals not met, if there is a change in medical status, if patient makes no progress towards goals or if patient is discharged from hospital.  The above assessment, treatment plan, treatment alternatives and goals were discussed and mutually agreed upon: by patient  Nicoletta Ba 01/23/2016, 2:52 PM

## 2016-01-23 NOTE — Plan of Care (Signed)
Problem: RH Bed to Chair Transfers Goal: LTG Patient will perform bed/chair transfers w/assist (PT) LTG: Patient will perform bed/chair transfers with assistance, with/without cues (PT). With LRAD  Problem: RH Car Transfers Goal: LTG Patient will perform car transfers with assist (PT) LTG: Patient will perform car transfers with assistance (PT). With LRAD  Problem: RH Furniture Transfers Goal: LTG Patient will perform furniture transfers w/assist (OT/PT LTG: Patient will perform furniture transfers  with assistance (OT/PT). With LRAD  Problem: RH Floor Transfers Goal: LTG Patient will perform floor transfers w/assist (PT) LTG: Patient will perform floor transfers with assistance (PT). With LRAD  Problem: RH Ambulation Goal: LTG Patient will ambulate in controlled environment (PT) LTG: Patient will ambulate in a controlled environment, # of feet with assistance (PT). 150 ft with LRAD Goal: LTG Patient will ambulate in home environment (PT) LTG: Patient will ambulate in home environment, # of feet with assistance (PT). 38 ft with LRAD Goal: LTG Patient will ambulate in community environment (PT) LTG: Patient will ambulate in community environment, # of feet with assistance (PT). 150 ft with LRAD  Problem: RH Stairs Goal: LTG Patient will ambulate up and down stairs w/assist (PT) LTG: Patient will ambulate up and down # of stairs with assistance (PT) 2 steps without rails & LRAD

## 2016-01-24 ENCOUNTER — Inpatient Hospital Stay (HOSPITAL_COMMUNITY): Payer: Self-pay

## 2016-01-24 ENCOUNTER — Inpatient Hospital Stay (HOSPITAL_COMMUNITY): Payer: Managed Care, Other (non HMO) | Admitting: *Deleted

## 2016-01-24 ENCOUNTER — Inpatient Hospital Stay (HOSPITAL_COMMUNITY): Payer: Self-pay | Admitting: Physical Therapy

## 2016-01-24 ENCOUNTER — Inpatient Hospital Stay (HOSPITAL_COMMUNITY): Payer: Managed Care, Other (non HMO) | Admitting: Occupational Therapy

## 2016-01-24 ENCOUNTER — Inpatient Hospital Stay (HOSPITAL_COMMUNITY): Payer: Managed Care, Other (non HMO) | Admitting: Speech Pathology

## 2016-01-24 MED ORDER — SALINE SPRAY 0.65 % NA SOLN
1.0000 | NASAL | Status: DC | PRN
  Administered 2016-01-24 – 2016-01-28 (×4): 1 via NASAL
  Filled 2016-01-24: qty 44

## 2016-01-24 MED ORDER — FLEET ENEMA 7-19 GM/118ML RE ENEM
1.0000 | ENEMA | Freq: Once | RECTAL | Status: AC
  Administered 2016-01-24: 1 via RECTAL
  Filled 2016-01-24: qty 1

## 2016-01-24 NOTE — Progress Notes (Signed)
Occupational Therapy Session Note  Patient Details  Name: Patrick [name] MRN: [id number] Date of Birth: January 16, 1956  Today's Date: 01/24/2016 OT Individual Time: 1000-1055 OT Individual Time Calculation (min): 55 min     Short Term Goals: Week 1:  OT Short Term Goal 1 (Week 1): Pt will demonstrate emergent awareness in context of ADL tasks with min questioning cuing OT Short Term Goal 2 (Week 1): pt will demonstrate selective attention in a moderate distracting environment with min questioning cues OT Short Term Goal 3 (Week 1): Pt will be gather all materials with min questioning cues at ambulatory level. OT Short Term Goal 4 (Week 1): Pt will   Skilled Therapeutic Interventions/Progress Updates:    Pt resting in bed upon arrival with family present.  Pt stated he needed to use the toilet and amb with steady A to toilet in bathroom.  Pt surprised that he had a bowel movement in his hospital brief.  Pt required assistance with hygiene.  Pt stood at sink to continue hygiene.  Pt completed dressing tasks without assistance with sit<>stand from EOB.  Pt amb without AD at supervision level to ADL apartment and practiced shower transfers without assistance.  Pt recalled his room number and was able to find his way back to his room.  Pt remained sitting EOB with bed alarm activated.  Pt agreeable to taking a shower in the morning.  Focus on activity tolerance, sit<>stand, standing balance, functional amb without AD, safety awareness, and cognitive remediation.   Therapy Documentation Precautions:  Precautions Precautions: Fall, Cervical Precaution Comments: no nose blowing Required Braces or Orthoses: Cervical Brace Cervical Brace: Hard collar, At all times Restrictions Weight Bearing Restrictions: No   Pain: Pain Assessment Pain Assessment: 0-10 Pain Score: 8  Pain Type: Acute pain Pain Location: Head Pain Orientation: Anterior;Upper Pain Descriptors / Indicators: Aching Pain  Frequency: Intermittent Pain Onset: On-going Patients Stated Pain Goal: 3 Pain Intervention(s): RN aware  ADL: ADL ADL Comments: see functional navigator  See Function Navigator for Current Functional Status.   Therapy/Group: Individual Therapy  Leroy Libman 01/24/2016, 11:06 AM

## 2016-01-24 NOTE — Progress Notes (Signed)
Social Work  Social Work Assessment and Plan  Patient Details  Name: Patrick [name] MRN: [id number] Date of Birth: 09-13-1955  Today's Date: 01/24/2016  Problem List:  Patient Active Problem List   Diagnosis Date Noted  . Diffuse TBI w loss of consciousness of unsp duration, init ([place]) 01/22/2016  . Cranial nerve VII palsy 01/22/2016  . Fall 01/10/2016  . Fracture of skull base ([place]) 01/10/2016  . Multiple facial fractures ([place]) 01/10/2016  . Alcohol abuse 01/10/2016  . Fracture of right occipital condyle ([place]) 01/10/2016  . Subarachnoid hemorrhage following injury with brief loss of consciousness but without open intracranial wound ([place]) 01/05/2016   Past Medical History:  Past Medical History:  Diagnosis Date  . Coronary artery disease    moderate  . Hyperlipidemia   . TIA (transient ischemic attack)    Past Surgical History:  Past Surgical History:  Procedure Laterality Date  . ORIF MANDIBULAR FRACTURE N/A 01/07/2016   Procedure: OPEN REDUCTION INTERNAL FIXATION (ORIF) MANDIBULAR FRACTURE;  Surgeon: Loel Lofty Dillingham, DO;  Location: [facility];  Service: Plastics;  Laterality: N/A;  . PLACEMENT OF LUMBAR DRAIN N/A 01/14/2016   Procedure: PLACEMENT OF LUMBAR DRAIN;  Surgeon: Newman Pies, MD;  Location: [facility];  Service: Neurosurgery;  Laterality: N/A;   Social History:  reports that he quit smoking about 21 years ago. His smoking use included Cigarettes. He has never used smokeless tobacco. He reports that he drinks alcohol. He reports that he does not use drugs.  Family / Support Systems Marital Status: Married How Long?: 2 yrs Patient Roles: Spouse, Parent Spouse/Significant Other: wife, Cheyene Stude @ (C) [phone] Children: They each have daughters from prior marriages.  Pt has one daughter living out of state and the other two (24, 61 yrs old) living locally.  Wife's 77 yo daughter, Mardene Celeste,  is living in the home with them. Anticipated Caregiver: wife and her 13  yo daughter Ability/Limitations of Caregiver: wife works part time in [place] 2 - 3 days per week as interpreter Caregiver Availability: 24/7 Family Dynamics: Pt describes wife as very supportive and able to adjust work schedule as needed.  Social History Preferred language: English Religion: None Cultural Background: NA Education: college Read: Yes Write: Yes Employment Status: Employed Name of Employer: [employer] of Employment: 15 (yrs) Return to Work Plans: Pt fully expects to return to work once medically cleared to do so. Legal Hisotry/Current Legal Issues: None Guardian/Conservator: None - per MD, pt not yet fully capable of making decisions on his own behalf.   Abuse/Neglect Physical Abuse: Denies Verbal Abuse: Denies Sexual Abuse: Denies Exploitation of patient/patient's resources: Denies Self-Neglect: Denies  Emotional Status Pt's affect, behavior adn adjustment status: Pt pleasant, talkative and able to complete the assessment interview without any difficulty.  He admits frustration with the situation but denies any significant emotional distress.  Speaks of his fall and injuries in a very matter-of-fact manner and reports no recall of events at all. Recent Psychosocial Issues: None Pyschiatric History: None Substance Abuse History: Chart indicates pt +ETOH at time of admit.  Will address when he is alone.  Patient / Family Perceptions, Expectations & Goals Pt/Family understanding of illness & functional limitations: Pt and wife with basic understanding of his multiple injuries.  Good understanding of current functional limitations/ need for CIR. Premorbid pt/family roles/activities: Pt working f/t and very active. Anticipated changes in roles/activities/participation: little change anticipated as goals have been set for mod independent Pt/family expectations/goals: "I hope I can  get back to work pretty quick."  [employer]:  None Premorbid Home Care/DME Agencies: None Transportation available at discharge: yes Resource referrals recommended: Neuropsychology  Discharge Planning Living Arrangements: Spouse/significant other, Children Support Systems: Spouse/significant other, Children, Other relatives, Friends/neighbors Type of Residence: Private residence Insurance underwriter Resources: Multimedia programmer (specify) Scientist, clinical (histocompatibility and immunogenetics)) Financial Resources: Employment Museum/gallery curator Screen Referred: No Living Expenses: Higher education careers adviser Management: Patient Does the patient have any problems obtaining your medications?: No Home Management: pt and family Patient/Family Preliminary Plans: pt to d/c home with wife and step-daughter Social Work Anticipated Follow Up Needs: HH/OP Expected length of stay: 7-10 days  Clinical Impression Unfortunate gentleman here after fall down stairs at home and suffered multiple injuries including TBI.  Wife at bedside and very supportive.  They have a good understanding of his injuries and wife aware he may need 24/7 supervision, however, appears goals are being set for mod independent.  Pt denies any significant emotional distress.  Will continue to follow for support and d/c planning needs.  Jayden Kratochvil 01/24/2016, 3:06 PM

## 2016-01-24 NOTE — Progress Notes (Signed)
[facility] PHYSICAL MEDICINE & REHABILITATION     PROGRESS NOTE    Subjective/Complaints: Complains of constipation, clogged right maxillary sinus. Therapy states he won't get out of bed to urinate  ROS: Pt denies fever, rash/itching,   blurred or double vision, nausea, vomiting, abdominal pain, diarrhea, chest pain, shortness of breath, palpitations, dysuria, dizziness, neck or back pain, bleeding,   or depression   Objective: Vital Signs: Blood pressure 124/81, pulse 80, temperature 98.2 F (36.8 C), temperature source Oral, resp. rate 18, weight 79.1 kg (174 lb 6.9 oz), SpO2 97 %. No results found.  Recent Labs  01/22/16 2032 01/23/16 1020  WBC 17.1* 11.5*  HGB 13.4 13.3  HCT 40.3 40.2  PLT 367 395    Recent Labs  01/22/16 2032 01/23/16 1020  NA 136 134*  K 4.0 3.9  CL 95* 96*  GLUCOSE 151* 124*  BUN 12 15  CREATININE 0.87 0.87  CALCIUM 10.0 9.7   CBG (last 3)  No results for input(s): GLUCAP in the last 72 hours.  Wt Readings from Last 3 Encounters:  01/23/16 79.1 kg (174 lb 6.9 oz)  01/05/16 82.4 kg (181 lb 10.5 oz)    Physical Exam:  Constitutional: He is oriented to person, place, and time. He appears well-developed and well-nourished.  HENT:  Head: Normocephalic and atraumatic.  Eyes: Conjunctivae and EOM are normal. Pupils are equal, round, and reactive to light. Right eye exhibits no discharge. Left eye exhibits no discharge.  Bilateral orbital ecchymoses.   Neck:  Neck immobilized by collar  Respiratory: Effort normal and breath sounds normal. No respiratory distress. He has no wheezes.  GI: Soft. Bowel sounds are normal. He exhibits no distension. There is no tenderness.  Musculoskeletal: He exhibits no edema or tenderness.  Neurological: He is alert and oriented to person, place, and time. He displays normal reflexes.  Alert and appropriate. Left facial weakness and decrease left lid closure/brow movement. also with mild dysarthria due to  dental bars. Decreased hearing left ear. He is able to follow commands without difficulty. Still slightly impulsive and tangential. UE strength grossly 5/5 with some pain inhibition proximally. LE: 4+ hf and ke and 5/5 adf/pf. No sensory findings.   Skin: Skin is warm and dry. No erythema.  Psychiatric: He has a normal mood and affect. His behavior is normal. Thought content normal.   Assessment/Plan: 1. Functional, cognitive, and mobility deficits secondary to TBI with polytrauma which require 3+ hours per day of interdisciplinary therapy in a comprehensive inpatient rehab setting. Physiatrist is providing close team supervision and 24 hour management of active medical problems listed below. Physiatrist and rehab team continue to assess barriers to discharge/monitor patient progress toward functional and medical goals.  Function:  Bathing Bathing position   Position: Shower  Bathing parts Body parts bathed by patient: Right arm, Left arm, Chest, Abdomen, Front perineal area, Buttocks, Right upper leg, Left upper leg, Right lower leg, Left lower leg Body parts bathed by helper: Back  Bathing assist Assist Level: Touching or steadying assistance(Pt > 75%)      Upper Body Dressing/Undressing Upper body dressing   What is the patient wearing?: Hospital gown                Upper body assist Assist Level: Touching or steadying assistance(Pt > 75%)      Lower Body Dressing/Undressing Lower body dressing   What is the patient wearing?: Hospital Gown, Non-skid slipper socks  Non-skid slipper socks- Performed by patient: Don/doff right sock, Don/doff left sock                    Lower body assist Assist for lower body dressing: Touching or steadying assistance (Pt > 75%)      Toileting Toileting   Toileting steps completed by patient: Adjust clothing prior to toileting, Performs perineal hygiene Toileting steps completed by helper: Adjust clothing after  toileting Toileting Assistive Devices: Grab bar or rail  Toileting assist Assist level: Touching or steadying assistance (Pt.75%)   Transfers Chair/bed transfer   Chair/bed transfer method: Anterior/posterior Chair/bed transfer assist level: Touching or steadying assistance (Pt > 75%)       Locomotion Ambulation     Max distance: 150 ft Assist level: Touching or steadying assistance (Pt > 75%)   Wheelchair Wheelchair activity did not occur: N/A        Cognition Comprehension Comprehension assist level: Understands basic 90% of the time/cues < 10% of the time  Expression Expression assist level: Expresses basic 90% of the time/requires cueing < 10% of the time.  Social Interaction Social Interaction assist level: Interacts appropriately 75 - 89% of the time - Needs redirection for appropriate language or to initiate interaction.  Problem Solving Problem solving assist level: Solves basic 75 - 89% of the time/requires cueing 10 - 24% of the time  Memory Memory assist level: Recognizes or recalls 75 - 89% of the time/requires cueing 10 - 24% of the time   Medical Problem List and Plan: 1.  Functional, mobility, and cognitive deficits secondary to TBI/polytrauma after fall down stairs             -continue therapies  -still with issues regarding insight/awareness 2.  DVT Prophylaxis/Anticoagulation: Mechanical: Sequential compression devices, below knee Bilateral lower extremities and mobility  3. HA/Pain Management: some improvement with current regimen--Fentanyl 50 mcg,  hydrocodone 20 mg 4-5 times daily  And prn IV dilaudid.   -continue Topamax for headaches 4. Mood: LCSW to follow for evaluation and support.  5. Neuropsych: This patient is not fully capable of making decisions on his own behalf. 6. Skin/Wound Care: Routine pressure relief measures.  7. Fluids/Electrolytes/Nutrition: Monitor I/O. Continue liquid diet.  8. Leucocytosis: labs pending for today.  9. Constipation:  Add Miralax bid today and repeat enema.  10. Urinary retention: ua/cx not done---reorder  -Continue urecholine and flomax  -stated that he needs to urinate standing or on commode. 11. Hyponatremia: sodium 134 yesterday 12. Impaired fating glucose: Will check Hgb A1c with next labs  13. Facial fractures/TMJ fracture s/p maxillomandibular fixation: Liquid diet.  -HOB elevated as able and No nose blowing.   -ocean nasal spray for nose             -likely left CN VII injury, ?VII injury also 14. HTN:     -improved control  .LOS (Days) 2 A FACE TO FACE EVALUATION WAS PERFORMED  Nabilah Davoli T 01/24/2016 9:23 AM

## 2016-01-24 NOTE — Progress Notes (Signed)
Occupational Therapy Session Note  Patient Details  Name: Starlin Bick MRN: [id number] Date of Birth: 04/02/1956  Today's Date: 01/24/2016 OT Individual Time: [id number] OT Individual Time Calculation (min): 58 min     Short Term Goals: Week 1:  OT Short Term Goal 1 (Week 1): Pt will demonstrate emergent awareness in context of ADL tasks with min questioning cuing OT Short Term Goal 2 (Week 1): pt will demonstrate selective attention in a moderate distracting environment with min questioning cues OT Short Term Goal 3 (Week 1): Pt will be gather all materials with min questioning cues at ambulatory level. OT Short Term Goal 4 (Week 1): Pt will   Skilled Therapeutic Interventions/Progress Updates:    Upon entering the room, pt supine in bed with wife present in room. Pt with no c/o pain this session and agreeable to OT intervention. Pt verbalizing need to urinal and OT encouraged pt to stand at toilet and he was successfully able to urinate. Pt needing close supervision during clothing management for safety. Pt also very focused on having BM and needing redirection. Pt given scavenger list this session of items located in familiar places, varied other items, and room numbers. Pt given task to locate items or ask for assistance as he would in community. Pt performed task with mod multimodal cues to locate items and problem solve during this task. Pt unable to locate room at end of task without max assist/cues. Pt returned to bed with call bell and all needed items within reach. Bed alarm activated.   Therapy Documentation Precautions:  Precautions Precautions: Fall, Cervical Precaution Comments: no nose blowing Required Braces or Orthoses: Cervical Brace Cervical Brace: Hard collar, At all times Restrictions Weight Bearing Restrictions: No Pain: Pain Assessment Pain Score: 0-No pain ADL: ADL ADL Comments: see functional navigator  See Function Navigator for Current Functional  Status.   Therapy/Group: Individual Therapy  Phineas Semen 01/24/2016, 9:28 PM

## 2016-01-24 NOTE — Progress Notes (Signed)
Physical Therapy Session Note  Patient Details  Name: Patrick [name] MRN: [id number] Date of Birth: 1955-12-21  Today's Date: 01/24/2016 PT Individual Time: 1432-1500 PT Individual Time Calculation (min): 28 min    Short Term Goals: Week 1:  PT Short Term Goal 1 (Week 1): STG = LTG due to short ELOS.  Skilled Therapeutic Interventions/Progress Updates:     Patient received supine in bed and agreeable to PT.  Supine>sit without cues or assist from PT. Patient performed upper and lower body dressing with supervision Assist and min cues for safety to done shoes prior to standing.   Gait in hall for 167ft x 2 supervision Assist from PT for safety and direction.   Dynamic balance training through obstacle course x 2 including stepping over 6 inch bolsters, weave through 6 cones, stepping up and over curb as, and tandem walking/side stepping on 1x4 balance board x 10 ft. PT provided supervision-min assist for safety and cues for ankle strategy with LOB on balance board.    Patient returned to room and left sitting EOB with Wife present and call bell in reach.   Therapy Documentation Precautions:  Precautions Precautions: Fall, Cervical Precaution Comments: no nose blowing Required Braces or Orthoses: Cervical Brace Cervical Brace: Hard collar, At all times Restrictions Weight Bearing Restrictions: No   See Function Navigator for Current Functional Status.   Therapy/Group: Individual Therapy  Lorie Phenix 01/24/2016, 3:03 PM

## 2016-01-24 NOTE — Progress Notes (Signed)
Speech Language Pathology Daily Session Note  Patient Details  Name: Patrick [name] MRN: [id number] Date of Birth: [birth year]  Today's Date: 01/24/2016 SLP Individual Time: 0730-0830 SLP Individual Time Calculation (min): 60 min   Short Term Goals: Week 1: SLP Short Term Goal 1 (Week 1): Patient will identify 2 cognitive deficits and their impact on personal safety given supervision question cues. SLP Short Term Goal 2 (Week 1): Patient will demonstrate short-term recall of new, daily information given supervision question cues. SLP Short Term Goal 3 (Week 1): Patient will demonstrate mildly complex problem solving given supervision verbal cues.   Skilled Therapeutic Interventions: Skilled treatment session focused on cognitive goals. SLP facilitated session by providing Mod A verbal cues for recall of the functions to his current medications. SLP initiated utilization of an external aid to maximize recall of his medication schedule in regards to anticipating the next time his pain medication is due. Patient reported he needed to void and attempted to use the urinal while in supine. Patient was unsuccessful and provided education by this clinician but declined attempting to use the commode or to get out of bed to stand to use the urinal. Patient left supine in bed with all needs within reach. Continue with current plan of care.   Function:  Cognition Comprehension Comprehension assist level: Understands basic 90% of the time/cues < 10% of the time  Expression   Expression assist level: Expresses basic 90% of the time/requires cueing < 10% of the time.  Social Interaction Social Interaction assist level: Interacts appropriately 75 - 89% of the time - Needs redirection for appropriate language or to initiate interaction.  Problem Solving Problem solving assist level: Solves basic 50 - 74% of the time/requires cueing 25 - 49% of the time  Memory Memory assist level: Recognizes or recalls 50  - 74% of the time/requires cueing 25 - 49% of the time    Pain No reports of pain  Therapy/Group: Individual Therapy  Anum Palecek 01/24/2016, 3:20 PM

## 2016-01-24 NOTE — Progress Notes (Signed)
Physical Therapy Session Note  Patient Details  Name: Patrick [name] MRN: [id number] Date of Birth: 1955-07-17  Today's Date: 01/24/2016 PT Individual Time: 1107-1202 PT Individual Time Calculation (min): 55 min    Short Term Goals: Week 1:  PT Short Term Goal 1 (Week 1): STG = LTG due to short ELOS.  Skilled Therapeutic Interventions/Progress Updates:    Pt received in bed & agreeable to PT. Pt voiced continued concerns regarding bowels and noted 9/10 headache but reports being premedicated. Pt ambulated room>gym without AD & supervision. Pt assembled multiple pipe tree shapes consisting of 11 pieces + 15 pieces + 13 pieces with occasional/min cuing to fit pieces appropriately & to select correct sized pieces. Recreational therapist arrived & present for remainder of session. Gait training while tossing ball & performing cognitive task throughout unit. Pt required cuing to continue to participate in physical & cognitive tasks simultaneously. Pt with difficulty recalling names of various animals & required significant cuing to do so. At end of session pt left sitting on EOB with alarm set & all needs within reach.   Therapy Documentation Precautions:  Precautions Precautions: Fall, Cervical Precaution Comments: no nose blowing Required Braces or Orthoses: Cervical Brace Cervical Brace: Hard collar, At all times Restrictions Weight Bearing Restrictions: No  See Function Navigator for Current Functional Status.   Therapy/Group: Individual Therapy  Waunita Schooner 01/24/2016, 12:11 PM

## 2016-01-24 NOTE — Evaluation (Signed)
Recreational Therapy Assessment and Plan  Patient Details  Name: Patrick [name] MRN: [id number] Date of Birth: 28-Dec-1955 Today's Date: 01/24/2016  Rehab Potential: Good ELOS: 10 days  Assessment Clinical Impression: Problem List:      Patient Active Problem List   Diagnosis Date Noted  . Diffuse TBI w loss of consciousness of unsp duration, init ([place]) 01/22/2016  . Cranial nerve VII palsy 01/22/2016  . Fall 01/10/2016  . Fracture of skull base ([place]) 01/10/2016  . Multiple facial fractures ([place]) 01/10/2016  . Alcohol abuse 01/10/2016  . Fracture of right occipital condyle ([place]) 01/10/2016  . Subarachnoid hemorrhage following injury with brief loss of consciousness but without open intracranial wound ([place]) 01/05/2016    Past Medical History:      Past Medical History:  Diagnosis Date  . Coronary artery disease    moderate  . Hyperlipidemia   . TIA (transient ischemic attack)    Past Surgical History:       Past Surgical History:  Procedure Laterality Date  . ORIF MANDIBULAR FRACTURE N/A 01/07/2016   Procedure: OPEN REDUCTION INTERNAL FIXATION (ORIF) MANDIBULAR FRACTURE;  Surgeon: Loel Lofty Dillingham, DO;  Location: [facility];  Service: Plastics;  Laterality: N/A;  . PLACEMENT OF LUMBAR DRAIN N/A 01/14/2016   Procedure: PLACEMENT OF LUMBAR DRAIN;  Surgeon: Newman Pies, MD;  Location: [facility];  Service: Neurosurgery;  Laterality: N/A;    Assessment & Plan Clinical Impression: Patient is a 60 y.o. year old malewho fell down 7 stairs and landed on his face. Patient intoxicated with amnesia of events and ETOH level 285. He was evaluated in ED on 01/05/16 and CT head done showing multiple facial fractures, basilar skull fracture and acute SAH/IPH along anterior frontal region. Dr. Arnoldo Morale evaluated patient and recommended monitoring as patient at risk for CSF bleed. CT cervical spine revealed minimally displaced right occipital condyle fracture and nondisplaced left  occipital condyle fracture, endplate fracture C3 and C6 with gas in spinal canal. To continue aspen collar for support. He underwent maxillomandibular fixation by Dr. Marla Roe on 10/2 and to continue on liquid diet. He developed severe HA as well as clear drainage from nares when up due to CSF leak. He was placed on bedrest without resolution of symptoms and CT head done 10/9 showing resolving IPH with significant left frontotemporal edema, layering fluid/complete opacification of L>R sphenoid sinus most likely area of CSF leak. He was taken to OR that day for placement of lumbar drain by Dr. Arnoldo Morale. Lumbar drain removed yesterday and therapy resumed. Mentation has improved but he continues to have severe HA. Foley discontinued today. Therapy ongoing and CIR recommended due to cognitive deficits, impulsivity, poor safety due to TBI.  Patient transferred to CIR on 01/22/2016.   Pt presents with decreased activity tolerance, decreased functional mobility, decreased balance, decreased attention, decreased safety, decreased awareness, decreased memory, decreased problem solving, & difficulty maintaining precautions limiting pt's independence with leisure/community pursuits. Plan Min 1 time per week >20 minutes  Recommendations for other services: None  Discharge Criteria: Patient will be discharged from TR if patient refuses treatment 3 consecutive times without medical reason.  If treatment goals not met, if there is a change in medical status, if patient makes no progress towards goals or if patient is discharged from hospital.  The above assessment, treatment plan, treatment alternatives and goals were discussed and mutually agreed upon: by patient    Session Note: No c/o pain Session focused on functional ambulation negotiating obstacles while performing  a ball toss and naming items in a category.  Pt required supervision for balance but min-mod cuing for decreased cognition throughout  session.  Pt alternately named items found in [employer] in alphabetical order with LRT while walking.  Once seated, pt challenged in naming animals beginning with the last letter of the previous animal names with mod cues.  [place] 01/24/2016, 8:44 AM

## 2016-01-25 ENCOUNTER — Inpatient Hospital Stay (HOSPITAL_COMMUNITY): Payer: Self-pay

## 2016-01-25 ENCOUNTER — Inpatient Hospital Stay (HOSPITAL_COMMUNITY): Payer: Self-pay | Admitting: Occupational Therapy

## 2016-01-25 ENCOUNTER — Inpatient Hospital Stay (HOSPITAL_COMMUNITY): Payer: Self-pay | Admitting: Physical Therapy

## 2016-01-25 ENCOUNTER — Inpatient Hospital Stay (HOSPITAL_COMMUNITY): Payer: Managed Care, Other (non HMO) | Admitting: Speech Pathology

## 2016-01-25 DIAGNOSIS — G441 Vascular headache, not elsewhere classified: Secondary | ICD-10-CM

## 2016-01-25 DIAGNOSIS — T1490XA Injury, unspecified, initial encounter: Secondary | ICD-10-CM

## 2016-01-25 DIAGNOSIS — R339 Retention of urine, unspecified: Secondary | ICD-10-CM

## 2016-01-25 DIAGNOSIS — R52 Pain, unspecified: Secondary | ICD-10-CM

## 2016-01-25 LAB — URINALYSIS, ROUTINE W REFLEX MICROSCOPIC
Bilirubin Urine: NEGATIVE
GLUCOSE, UA: NEGATIVE mg/dL
Hgb urine dipstick: NEGATIVE
KETONES UR: NEGATIVE mg/dL
LEUKOCYTES UA: NEGATIVE
Nitrite: NEGATIVE
PH: 7.5 (ref 5.0–8.0)
Protein, ur: NEGATIVE mg/dL
Specific Gravity, Urine: 1.007 (ref 1.005–1.030)

## 2016-01-25 MED ORDER — POLYETHYLENE GLYCOL 3350 17 G PO PACK
17.0000 g | PACK | Freq: Every day | ORAL | Status: DC
  Administered 2016-01-26 – 2016-01-29 (×4): 17 g via ORAL
  Filled 2016-01-25 (×4): qty 1

## 2016-01-25 MED ORDER — FENTANYL 12 MCG/HR TD PT72
12.5000 ug | MEDICATED_PATCH | TRANSDERMAL | Status: DC
  Administered 2016-01-26: 12.5 ug via TRANSDERMAL
  Filled 2016-01-25: qty 1

## 2016-01-25 NOTE — Progress Notes (Signed)
Occupational Therapy Session Note  Patient Details  Name: Patrick [name] MRN: [id number] Date of Birth: 05/29/1955  Today's Date: 01/25/2016 OT Individual Time: [id number] OT Individual Time Calculation (min): 92 min     Short Term Goals: Week 1:  OT Short Term Goal 1 (Week 1): Pt will demonstrate emergent awareness in context of ADL tasks with min questioning cuing OT Short Term Goal 2 (Week 1): pt will demonstrate selective attention in a moderate distracting environment with min questioning cues OT Short Term Goal 3 (Week 1): Pt will be gather all materials with min questioning cues at ambulatory level. OT Short Term Goal 4 (Week 1): Pt will   Skilled Therapeutic Interventions/Progress Updates:   Pt participated in skilled OT session focusing on short term memory, problem solving, functional mobility, planning, and categorizing during IADL grocery shopping task. Pt was lying in bed at time of arrival and was agreeable to tx. Pt wrote down mock grocery list with extra time. Pt ambulated to dayroom where environment was set up as mock grocery store with pt gathering items on list with 100% accuracy. Afterwards pt ambulated with grocery cart to therapy kitchen where food items were placed in bags for pt to unload and sort into rightful cabinets/pantries. Pt was able to correctly categorize items with min vcs while dividing attention between sustaining conversation with this therapist. Pt did stop at times to verbally process problem solving. Pt then completed a vacuuming task exhibiting increased safety awareness by stooping to pick items off of floor prior to vacuuming and during mgt of vacuum cord. At end of session pt was returned to bed with all needs within reach and bed alarm activated.   Therapy Documentation Precautions:  Precautions Precautions: Fall, Cervical Precaution Comments: no nose blowing Required Braces or Orthoses: Cervical Brace Cervical Brace: Hard collar, At all  times Restrictions Weight Bearing Restrictions: No General:   Vital Signs: Therapy Vitals Temp: 98.5 F (36.9 C) Temp Source: Oral Pulse Rate: 84 Resp: 16 BP: 124/70 Patient Position (if appropriate): Lying Oxygen Therapy SpO2: 98 % O2 Device: Not Delivered Pain: No c/o pain during session  Pain Assessment Pain Score: 9  ADL: ADL ADL Comments: see functional navigator    See Function Navigator for Current Functional Status.   Therapy/Group: Individual Therapy  Patrick [name] [name] 01/25/2016, 4:25 PM

## 2016-01-25 NOTE — Progress Notes (Signed)
[facility] PHYSICAL MEDICINE & REHABILITATION     PROGRESS NOTE    Subjective/Complaints: Pt laying in bed working with SLP.  He has some confusion and perseverated.  He complains of bowel incontinence.  ROS: Denies CP, SOB, N/V/D.   Objective: Vital Signs: Blood pressure 136/76, pulse 77, temperature 98.5 F (36.9 C), temperature source Oral, resp. rate 18, weight 79.1 kg (174 lb 6.9 oz), SpO2 98 %. No results found.  Recent Labs  01/22/16 2032 01/23/16 1020  WBC 17.1* 11.5*  HGB 13.4 13.3  HCT 40.3 40.2  PLT 367 395    Recent Labs  01/22/16 2032 01/23/16 1020  NA 136 134*  K 4.0 3.9  CL 95* 96*  GLUCOSE 151* 124*  BUN 12 15  CREATININE 0.87 0.87  CALCIUM 10.0 9.7   CBG (last 3)  No results for input(s): GLUCAP in the last 72 hours.  Wt Readings from Last 3 Encounters:  01/23/16 79.1 kg (174 lb 6.9 oz)  01/05/16 82.4 kg (181 lb 10.5 oz)    Physical Exam:  Constitutional:  He appears well-developed and well-nourished. NAD. HENT: Normocephalic. Bilateral orbital ecchymoses.   Eyes: Conjunctivae and EOM are normal.  Neck: Neck immobilized by collar  Respiratory: Effort normal and breath sounds normal. No respiratory distress. He has no wheezes.  GI: Soft. Bowel sounds are normal. He exhibits no distension. There is no tenderness.  Musculoskeletal: He exhibits no edema or tenderness.  Neurological: He is alert and oriented.  Left facial weakness and decrease left lid closure/brow movement.  Mild dysarthria due to dental bars.  Decreased hearing left ear.  UE strength grossly 5/5 with some pain inhibition proximally. LE: 4+ hf and ke and 5/5 adf/pf.  Skin: Skin is warm and dry. No erythema.  Psychiatric: Still slightly impulsive and tangential, confused and perseverative.  Assessment/Plan: 1. Functional, cognitive, and mobility deficits secondary to TBI with polytrauma which require 3+ hours per day of interdisciplinary therapy in a comprehensive inpatient  rehab setting. Physiatrist is providing close team supervision and 24 hour management of active medical problems listed below. Physiatrist and rehab team continue to assess barriers to discharge/monitor patient progress toward functional and medical goals.  Function:  Bathing Bathing position   Position: Shower  Bathing parts Body parts bathed by patient: Right arm, Left arm, Chest, Abdomen, Front perineal area, Buttocks, Right upper leg, Left upper leg, Right lower leg, Left lower leg Body parts bathed by helper: Back  Bathing assist Assist Level: Touching or steadying assistance(Pt > 75%)      Upper Body Dressing/Undressing Upper body dressing   What is the patient wearing?: Pull over shirt/dress                Upper body assist Assist Level: Supervision or verbal cues      Lower Body Dressing/Undressing Lower body dressing   What is the patient wearing?: Pants, Non-skid slipper socks, Socks, Shoes     Pants- Performed by patient: Thread/unthread right pants leg, Thread/unthread left pants leg, Pull pants up/down   Non-skid slipper socks- Performed by patient: Don/doff right sock, Don/doff left sock   Socks - Performed by patient: Don/doff right sock, Don/doff left sock   Shoes - Performed by patient: Don/doff right shoe, Don/doff left shoe, Fasten right, Fasten left            Lower body assist Assist for lower body dressing: Touching or steadying assistance (Pt > 75%)      Toileting Toileting  Toileting steps completed by patient: Adjust clothing prior to toileting, Performs perineal hygiene, Adjust clothing after toileting Toileting steps completed by helper: Adjust clothing after toileting, Performs perineal hygiene Toileting Assistive Devices: Grab bar or rail  Toileting assist Assist level: Supervision or verbal cues   Transfers Chair/bed transfer   Chair/bed transfer method: Anterior/posterior Chair/bed transfer assist level: Touching or steadying  assistance (Pt > 75%)       Locomotion Ambulation     Max distance: 200 Assist level: Supervision or verbal cues   Wheelchair Wheelchair activity did not occur: N/A        Cognition Comprehension Comprehension assist level: Understands basic 90% of the time/cues < 10% of the time  Expression Expression assist level: Expresses basic 90% of the time/requires cueing < 10% of the time.  Social Interaction Social Interaction assist level: Interacts appropriately 75 - 89% of the time - Needs redirection for appropriate language or to initiate interaction.  Problem Solving Problem solving assist level: Solves basic 50 - 74% of the time/requires cueing 25 - 49% of the time  Memory Memory assist level: Recognizes or recalls 50 - 74% of the time/requires cueing 25 - 49% of the time   Medical Problem List and Plan: 1.  Functional, mobility, and cognitive deficits secondary to TBI/polytrauma after fall down stairs             -continue therapies  -still with issues regarding insight/awareness 2.  DVT Prophylaxis/Anticoagulation: Mechanical: Sequential compression devices, below knee Bilateral lower extremities and mobility  3. HA/Pain Management: some improvement with current regimen--Fentanyl 50 mcg decreased to 12.5 on 10/21  Hydrocodone 7.5mg  4-5 times daily PRN  -continue Topamax for headaches 4. Mood: LCSW to follow for evaluation and support.  5. Neuropsych: This patient is not fully capable of making decisions on his own behalf. 6. Skin/Wound Care: Routine pressure relief measures.  7. Fluids/Electrolytes/Nutrition: Monitor I/O. Continue liquid diet.  8. Leucocytosis:   WBCs 11.5 on 10/18 (trending down)  Will cont to monitor 9. Neurogenic bowel: Miralax changed to daily on 10/20 10. Urinary retention:   -Continue urecholine and flomax  -stated that he needs to urinate standing or on commode.  -Ua/Ucx ordered 11. Hyponatremia: sodium 134 on 10/18 12. Impaired fating glucose:  Check Hgb A1c with next labs  13. Facial fractures/TMJ fracture s/p maxillomandibular fixation: Liquid diet.    -HOB elevated as able and No nose blowing.   -ocean nasal spray for nose             -likely left CN VII injury 14. HTN:     -improved control  .LOS (Days) 3 A FACE TO FACE EVALUATION WAS PERFORMED  Ankit Lorie Phenix 01/25/2016 8:47 AM

## 2016-01-25 NOTE — Progress Notes (Signed)
Social Work Patient ID: Patrick [name], male   DOB: 23-Jan-1956, 60 y.o.   MRN: [id number]   Have reviewed team conference with pt and wife.  Both aware and agreeable with targeted d/c date of 10/26 and mod independent goals. Wife with questions about insurance coverage and [place], reviewed with her (again).  Continue to follow.  Tamina Cyphers, LCSW

## 2016-01-25 NOTE — Progress Notes (Signed)
Physical Therapy Session Note  Patient Details  Name: Patrick [name] MRN: [id number] Date of Birth: 08-06-1955  Today's Date: 01/25/2016 PT Individual Time: 1103-1200 PT Individual Time Calculation (min): 57 min    Short Term Goals: Week 1:  PT Short Term Goal 1 (Week 1): STG = LTG due to short ELOS.  Skilled Therapeutic Interventions/Progress Updates:     Patient instructed in problem solving in unfamiliar environment to navigate to hospital gift shop and then to coffee shop. Min-mod cues for problem solving and reminders to utilize signs and maps in hospital. Patient able to retain target distension and recall previous locations. Gait on uneven surface around fountain for cement and paved sidewalk. Patient able to recall location of rehab unit and returned to rehab gym with min cues for use of signs. Standing balance onairex to toss ball to self and off trampoline with supervision-min Assist x 2 to prevent posterior LOB. Standing on airex body blade vertical and horizontal x 45 seconds each with supervision Assist.   Patient returned to room and left sitting on EOB with bed alarm activated, call bell in reach, and all needs met.   Therapy Documentation Precautions:  Precautions Precautions: Fall, Cervical Precaution Comments: no nose blowing Required Braces or Orthoses: Cervical Brace Cervical Brace: Hard collar, At all times Restrictions Weight Bearing Restrictions: No   See Function Navigator for Current Functional Status.   Therapy/Group: Individual Therapy  Lorie Phenix 01/25/2016, 12:26 PM

## 2016-01-25 NOTE — Progress Notes (Signed)
Social Work Patient ID: Patrick [name], male   DOB: [birth year], 60 y.o.   MRN: [id number]   Spoke with therapies this afternoon and they feel pt's d/c could be moved to earlier date - recommend 10/24.  MD in agreement as well as pt.  Left message for wife of change in d/c date.  Michaline Kindig, LCSW

## 2016-01-25 NOTE — IPOC Note (Signed)
Overall Plan of Care [facility]) Patient Details Name: Patrick [name] MRN: [id number] DOB: 01-15-1956  Admitting Diagnosis: TBI  Hospital Problems: Principal Problem:   Diffuse TBI w loss of consciousness of unsp duration, init (HCC) Active Problems:   Fracture of skull base (HCC)   Cranial nerve VII palsy   Vascular headache   Trauma   Generalized pain   Urinary retention     Functional Problem List: Nursing Bowel, Bladder, Endurance, Medication Management, Motor, Nutrition, Pain, Safety, Skin Integrity  PT Balance, Behavior, Endurance, Safety, Pain, Sensory, Motor  OT Balance, Safety, Behavior, Sensory, Cognition, Edema, Endurance, Motor, Nutrition, Pain  SLP Behavior, Cognition, Safety  TR Endurance, Motor, Pain, Safety       Basic ADL's: OT Eating, Grooming, Bathing, Dressing, Toileting     Advanced  ADL's: OT Simple Meal Preparation     Transfers: PT Bed Mobility, Bed to Chair, Furniture, Floor, Teacher, early years/pre, Metallurgist: PT Ambulation, Stairs     Additional Impairments: OT None  SLP Social Cognition   Social Interaction, Memory, Awareness, Problem Solving  TR      Anticipated Outcomes Item Anticipated Outcome  Self Feeding mod I   Swallowing      Basic self-care  mod I   Toileting  mod I    Bathroom Transfers mod I   Bowel/Bladder  Min assist  Transfers  supervision  Locomotion  supervision  Communication     Cognition  Supervision   Pain  < 4  Safety/Judgment  Supervision   Therapy Plan: PT Intensity: Minimum of 1-2 x/day ,45 to 90 minutes PT Frequency: 5 out of 7 days PT Duration Estimated Length of Stay: 7-10 days OT Intensity: Minimum of 1-2 x/day, 45 to 90 minutes OT Frequency: 5 out of 7 days OT Duration/Estimated Length of Stay: ~9 days SLP Intensity: Minumum of 1-2 x/day, 30 to 90 minutes SLP Frequency: 3 to 5 out of 7 days SLP Duration/Estimated Length of Stay: 10/26       Team Interventions: Nursing  Interventions Patient/Family Education, Bladder Management, Bowel Management, Medication Management, Pain Management, Skin Care/Wound Management, Cognitive Remediation/Compensation, Psychosocial Support  PT interventions Ambulation/gait training, Community reintegration, DME/adaptive equipment instruction, Neuromuscular re-education, Psychosocial support, Stair training, UE/LE Strength taining/ROM, UE/LE Coordination activities, Therapeutic Activities, Pain management, Discharge planning, Training and development officer, Cognitive remediation/compensation, Disease management/prevention, Functional mobility training, Patient/family education, Therapeutic Exercise, Visual/perceptual remediation/compensation  OT Interventions Balance/vestibular training, Discharge planning, Functional electrical stimulation, Pain management, Self Care/advanced ADL retraining, UE/LE Coordination activities, Therapeutic Activities, Visual/perceptual remediation/compensation, Therapeutic Exercise, Skin care/wound managment, Patient/family education, Functional mobility training, Disease mangement/prevention, Cognitive remediation/compensation, Community reintegration, Engineer, drilling, Neuromuscular re-education, Psychosocial support, Splinting/orthotics, UE/LE Strength taining/ROM  SLP Interventions Cognitive remediation/compensation, English as a second language teacher, Internal/external aids, Patient/family education, Therapeutic Activities, Functional tasks  TR Interventions Adaptive equipment instruction, 1:1 session, Training and development officer, Functional mobility training, Community reintegration, Cognitive remediation/compensation, Barrister's clerk education, Therapeutic activities, Recreation/leisure participation, Therapeutic exercise, UE/LE Coordination activities  SW/CM Interventions Discharge Planning, Barrister's clerk, Patient/Family Education    Team Discharge Planning: Destination: PT-Home ,OT- Home ,  SLP-Home Projected Follow-up: PT-Outpatient PT, 24 hour supervision/assistance, OT-  Outpatient OT, SLP-Outpatient SLP, 24 hour supervision/assistance (TBD) Projected Equipment Needs: PT-To be determined, OT- To be determined, SLP-None recommended by SLP Equipment Details: PT- , OT-  Patient/family involved in discharge planning: PT- Patient,  OT-Patient, SLP-Patient  MD ELOS: 6-9 days. Medical Rehab Prognosis:  Good Assessment:  60 y.o.malewho fell down 7 stairs and landed on his face.  Patient intoxicated with amnesia of events and ETOH level 285. He was evaluated in ED on 01/05/16 and CT head done showing multiple facial fractures, basilar skull fracture and acute SAH/IPH along anterior frontal region. Dr. Arnoldo Morale evaluated patient and recommended monitoring as patient at risk for CSF bleed. CT cervical spine revealed minimally displaced right occipital condyle fracture and nondisplaced left occipital condyle fracture, endplate fracture C3 and C6 with gas in spinal canal.  To continue aspen collar for support. He underwent maxillomandibular fixation by Dr. Marla Roe on 10/2 and to continue on liquid diet.   He developed severe HA as well as clear drainage from nares when up due to CSF leak. He was placed on bedrest without resolution of symptoms and CT head done 10/9 showing resolving IPH with significant left frontotemporal edema, layering fluid/complete opacification of L>R sphenoid sinus most likely area of CSF leak.  He was taken to OR that day for placement of lumbar drain by Dr. Arnoldo Morale.  Lumbar drain removed and therapy resumed. Mentation has improved but he continues to have HA. Functional deficits with impulsivity, poor safety, and cognition.  Will set goals for Mod I for self care and supervision for mobility and cognition.     See Team Conference Notes for weekly updates to the plan of care

## 2016-01-25 NOTE — Progress Notes (Signed)
Speech Language Pathology Daily Session Note  Patient Details  Name: Patrick [name] MRN: [id number] Date of Birth: March 01, 1956  Today's Date: 01/25/2016 SLP Individual Time: 0730-0830 SLP Individual Time Calculation (min): 60 min   Short Term Goals: Week 1: SLP Short Term Goal 1 (Week 1): Patient will identify 2 cognitive deficits and their impact on personal safety given supervision question cues. SLP Short Term Goal 2 (Week 1): Patient will demonstrate short-term recall of new, daily information given supervision question cues. SLP Short Term Goal 3 (Week 1): Patient will demonstrate mildly complex problem solving given supervision verbal cues.   Skilled Therapeutic Interventions: Skilled treatment session focused on cognitive goals. SLP facilitated session by providing extra time and Min A verbal cues for problem solving during a mildly complex scheduling task. Patient asking appropriate questions in regards to current medical issues but continues to demonstrate intermittent confusion in regards to his injuries and their impact on his function. Patient recalled and completed memory task ("homework" from previous day) independently. Patient left supine in bed with all needs within reach. Continue with current plan of care.   Function:   Cognition Comprehension Comprehension assist level: Understands basic 90% of the time/cues < 10% of the time  Expression   Expression assist level: Expresses basic 90% of the time/requires cueing < 10% of the time.  Social Interaction Social Interaction assist level: Interacts appropriately 75 - 89% of the time - Needs redirection for appropriate language or to initiate interaction.  Problem Solving Problem solving assist level: Solves basic 50 - 74% of the time/requires cueing 25 - 49% of the time  Memory Memory assist level: Recognizes or recalls 50 - 74% of the time/requires cueing 25 - 49% of the time    Pain No reports of pain    Therapy/Group: Individual Therapy  Nathifa Ritthaler 01/25/2016, 12:51 PM

## 2016-01-25 NOTE — Progress Notes (Signed)
Occupational Therapy Session Note  Patient Details  Name: Patrick [name] MRN: [id number] Date of Birth: [birth year]  Today's Date: 01/25/2016 OT Individual Time: 1000-1057 OT Individual Time Calculation (min): 57 min     Short Term Goals: Week 1:  OT Short Term Goal 1 (Week 1): Pt will demonstrate emergent awareness in context of ADL tasks with min questioning cuing OT Short Term Goal 2 (Week 1): pt will demonstrate selective attention in a moderate distracting environment with min questioning cues OT Short Term Goal 3 (Week 1): Pt will be gather all materials with min questioning cues at ambulatory level. OT Short Term Goal 4 (Week 1): Pt will   Skilled Therapeutic Interventions/Progress Updates:    Pt engaged in BADL retraining including bathing at shower level and dressing with sit<>stand from EOB.  Pt stood to complete shower with no LOB noted.  Pt stands at toilet to void without LOB.  Pt completed oral care standing at sink.  Pt required more than a reasonable amount of time to complete tasks.  Pt amb to ADL apartment and engaged in simple home mgmt tasks with focus on increase balance.  Pt remembered room # and able to locate room after ambulating in various locations in Rehab unit.  Pt requested to use toilet upon return to room and remained on toilet with instructions to call staff when finished.  Pt verbalized understanding.  Continued discharge planning. Focus on activity tolerance, standing balance, functional amb for home mgmt tasks, cognitive remediation, and safety awreness to increase independence with BADLs.  Therapy Documentation Precautions:  Precautions Precautions: Fall, Cervical Precaution Comments: no nose blowing Required Braces or Orthoses: Cervical Brace Cervical Brace: Hard collar, At all times Restrictions Weight Bearing Restrictions: No General:   Vital Signs:   Pain: Pain Assessment Pain Assessment: 0-10 Pain Score: 5  Pain Location: Head Pain  Descriptors / Indicators: Aching Pain Frequency: Intermittent Pain Onset: On-going Patients Stated Pain Goal: 2 Pain Intervention(s): Medication (See eMAR) ADL: ADL ADL Comments: see functional navigator Exercises:   Other Treatments:    See Function Navigator for Current Functional Status.   Therapy/Group: Individual Therapy  Leroy Libman 01/25/2016, 11:01 AM

## 2016-01-26 ENCOUNTER — Inpatient Hospital Stay (HOSPITAL_COMMUNITY): Payer: Managed Care, Other (non HMO) | Admitting: Occupational Therapy

## 2016-01-26 ENCOUNTER — Inpatient Hospital Stay (HOSPITAL_COMMUNITY): Payer: Self-pay | Admitting: Physical Therapy

## 2016-01-26 ENCOUNTER — Inpatient Hospital Stay (HOSPITAL_COMMUNITY): Payer: Managed Care, Other (non HMO) | Admitting: Speech Pathology

## 2016-01-26 DIAGNOSIS — R8290 Unspecified abnormal findings in urine: Secondary | ICD-10-CM

## 2016-01-26 LAB — URINE CULTURE: Culture: <10000 — AB

## 2016-01-26 NOTE — Progress Notes (Signed)
Physical Therapy Session Note  Patient Details  Name: Hayden Gera MRN: [id number] Date of Birth: 1956-03-29  Today's Date: 01/26/2016 PT Individual Time: 1105-1200 PT Individual Time Calculation (min): 55 min    Short Term Goals: Week 1:  PT Short Term Goal 1 (Week 1): STG = LTG due to short ELOS.  Skilled Therapeutic Interventions/Progress Updates:    Patient received sitting in recliner and agreeable to PT. Patient requested to use restroom at start of session. Distant supervision Assist for toileting and don/doff brief due to incontinence.  Session focused on problem solving and navigation of new environments. Patient able to find coffee shop/main entrance in hospital with only min cues for use of signs in hall way when in new area. Patient able to remember location of main entrance from previous day. Patient able to locate additional entrance to hospital through employee parking deck with only min cues to objective of task.  Standing balance on Wii fit with tilt table x 2 with close supervision Assist and min cues for improved use of ankle strategy to adjust WB through R and L LE.   Patient able to locate room without cues from PT and was noted to appropriately use signs in hall to reach proper wing of hospital.   Patient left sitting in recliner with call bell in reach and tray table at lap for lunch.   Therapy Documentation Precautions:  Precautions Precautions: Fall, Cervical Precaution Comments: no nose blowing Required Braces or Orthoses: Cervical Brace Cervical Brace: Hard collar, At all times Restrictions Weight Bearing Restrictions: No  See Function Navigator for Current Functional Status.   Therapy/Group: Individual Therapy  Lorie Phenix 01/26/2016, 6:04 PM

## 2016-01-26 NOTE — Progress Notes (Signed)
[facility]IC[facility] RE[facility]ABILITATION     PROGRESS NOTE    Subjective/Complaints: Pt sitting up in bed with wife at bedside.  Wife with several questions regarding discharge.    ROS: Denies CP, SOB, N/V/D.  Objective: Vital Signs: Blood pressure (!) 144/63, pulse 69, temperature 98.8 F (37.1 C), temperature source Oral, resp. rate 18, weight 79.1 kg (174 lb 6.9 oz), SpO2 98 %. No results found. No results for input(s): WBC, HGB, HCT, PLT in the last 72 hours. No results for input(s): NA, K, CL, GLUCOSE, BUN, CREATININE, CALCIUM in the last 72 hours.  Invalid input(s): CO CBG (last 3)  No results for input(s): GLUCAP in the last 72 hours.  Wt Readings from Last 3 Encounters:  01/23/16 79.1 kg (174 lb 6.9 oz)  01/05/16 82.4 kg (181 lb 10.5 oz)    Physical Exam:  Constitutional:  He appears well-developed and well-nourished. NAD. HENT: Normocephalic. Bilateral orbital ecchymoses.   Eyes: Conjunctivae and EOM are normal.  Neck: Neck immobilized by collar  Respiratory: Effort normal and breath sounds normal. No respiratory distress. He has no wheezes.  GI: Soft. Bowel sounds are normal. He exhibits no distension. There is no tenderness.  Musculoskeletal: He exhibits no edema or tenderness.  Neurological: He is alert and oriented.  Left facial weakness and decrease left lid closure/brow movement.  Mild dysarthria due to dental bars.  Decreased hearing left ear.  UE strength grossly 5/5 with some pain inhibition proximally. LE: 4+/5 hf and ke and 5/5 adf/pf.  Skin: Skin is warm and dry. No erythema.  Psychiatric: Still slightly impulsive and tangential, perseverative.  Assessment/Plan: 1. Functional, cognitive, and mobility deficits secondary to TBI with polytrauma which require 3+ hours per day of interdisciplinary therapy in a comprehensive inpatient rehab setting. Physiatrist is providing close team supervision and 24 hour management of active medical problems  listed below. Physiatrist and rehab team continue to assess barriers to discharge/monitor patient progress toward functional and medical goals.  Function:  Bathing Bathing position   Position: Shower  Bathing parts Body parts bathed by patient: Right arm, Left arm, Chest, Abdomen, Front perineal area, Buttocks, Right upper leg, Left upper leg, Right lower leg, Left lower leg, Back Body parts bathed by helper: Back  Bathing assist Assist Level: Supervision or verbal cues      Upper Body Dressing/Undressing Upper body dressing   What is the patient wearing?: Pull over shirt/dress     Pull over shirt/dress - Perfomed by patient: Thread/unthread right sleeve, Thread/unthread left sleeve, Put head through opening, Pull shirt over trunk          Upper body assist Assist Level: Supervision or verbal cues      Lower Body Dressing/Undressing Lower body dressing   What is the patient wearing?: Underwear, Non-skid slipper socks Underwear - Performed by patient: Thread/unthread right underwear leg, Thread/unthread left underwear leg, Pull underwear up/down   Pants- Performed by patient: Thread/unthread right pants leg, Thread/unthread left pants leg, Pull pants up/down   Non-skid slipper socks- Performed by patient: Don/doff right sock, Don/doff left sock   Socks - Performed by patient: Don/doff right sock, Don/doff left sock   Shoes - Performed by patient: Don/doff right shoe, Don/doff left shoe, Fasten right, Fasten left            Lower body assist Assist for lower body dressing: Supervision or verbal cues      Toileting Toileting   Toileting steps completed by patient: Adjust clothing prior  to toileting, Performs perineal hygiene, Adjust clothing after toileting Toileting steps completed by helper: Adjust clothing after toileting, Performs perineal hygiene Toileting Assistive Devices: Grab bar or rail  Toileting assist Assist level: Supervision or verbal cues    Transfers Chair/bed transfer   Chair/bed transfer method: Ambulatory Chair/bed transfer assist level: Supervision or verbal cues       Locomotion Ambulation     Max distance: 500+ ft  Assist level: Supervision or verbal cues   Wheelchair Wheelchair activity did not occur: N/A        Cognition Comprehension Comprehension assist level: Understands basic 90% of the time/cues < 10% of the time  Expression Expression assist level: Expresses basic 90% of the time/requires cueing < 10% of the time.  Social Interaction Social Interaction assist level: Interacts appropriately 75 - 89% of the time - Needs redirection for appropriate language or to initiate interaction.  Problem Solving Problem solving assist level: Solves basic 75 - 89% of the time/requires cueing 10 - 24% of the time  Memory Memory assist level: Recognizes or recalls 75 - 89% of the time/requires cueing 10 - 24% of the time   Medical Problem List and Plan: 1.  Functional, mobility, and cognitive deficits secondary to TBI/polytrauma after fall down stairs             -continue therapies  -still with issues regarding insight/awareness 2.  DVT Prophylaxis/Anticoagulation: Mechanical: Sequential compression devices, below knee Bilateral lower extremities and mobility  3. HA/Pain Management:   Fentanyl 50 mcg decreased to 12.5 on 10/21  Hydrocodone 7.5mg  4-5 times daily PRN  -continue Topamax for headaches 4. Mood: LCSW to follow for evaluation and support.  5. Neuropsych: This patient is not fully capable of making decisions on his own behalf. 6. Skin/Wound Care: Routine pressure relief measures.  7. Fluids/Electrolytes/Nutrition: Monitor I/O. Continue liquid diet.  8. Leucocytosis:   WBCs 11.5 on 10/18 (trending down)  Will cont to monitor 9. Neurogenic bowel: Miralax changed to daily on 10/20 10. Urinary retention:   -Continue urecholine and flomax  -stated that he needs to urinate standing or on commode.  -Ua  cloudy, Ucx pending 11. Hyponatremia: sodium 134 on 10/18 12. Impaired fating glucose: Check Hgb A1c with next labs  13. Facial fractures/TMJ fracture s/p maxillomandibular fixation: Liquid diet.    -HOB elevated as able and No nose blowing.   -ocean nasal spray for nose             -likely left CN VII injury 14. HTN:     -improved control  .LOS (Days) 4 A FACE TO FACE EVALUATION WAS PERFORMED  Patrick [name] Lorie Phenix 01/26/2016 10:20 AM

## 2016-01-26 NOTE — Progress Notes (Signed)
Speech Language Pathology Daily Session Note  Patient Details  Name: Patrick [name] MRN: [id number] Date of Birth: September 26, 1955  Today's Date: 01/26/2016 SLP Individual Time: [id number] SLP Individual Time Calculation (min): 45 min   Short Term Goals: Week 1: SLP Short Term Goal 1 (Week 1): Patient will identify 2 cognitive deficits and their impact on personal safety given supervision question cues. SLP Short Term Goal 2 (Week 1): Patient will demonstrate short-term recall of new, daily information given supervision question cues. SLP Short Term Goal 3 (Week 1): Patient will demonstrate mildly complex problem solving given supervision verbal cues.   Skilled Therapeutic Interventions:   Skilled treatment session focused on addressing cognition goals. SLP facilitated session by providing Min assist faded to Supervision level question cues to utilize working memory strategies for recall of given items/locations on unit.  Patient required Supervision with Min assist times one during loss of balance x1 with a turn during route finding task on unit.  Patient required Min question cues to identify 2 cognitive changes.  Continue with current plan of care.    Function:   Cognition Comprehension Comprehension assist level: Understands basic 90% of the time/cues < 10% of the time  Expression   Expression assist level: Expresses basic 90% of the time/requires cueing < 10% of the time.  Social Interaction Social Interaction assist level: Interacts appropriately 75 - 89% of the time - Needs redirection for appropriate language or to initiate interaction.  Problem Solving Problem solving assist level: Solves basic 75 - 89% of the time/requires cueing 10 - 24% of the time  Memory Memory assist level: Recognizes or recalls 75 - 89% of the time/requires cueing 10 - 24% of the time    Pain Pain Assessment Pain Assessment: 0-10 Pain Score: 5  Pain Type: Acute pain Pain Location: Neck Pain  Descriptors / Indicators: Aching Pain Intervention(s): RN made aware Multiple Pain Sites: No  Therapy/Group: Individual Therapy  Carmelia Roller., CCC-SLP [id number]  Connell 01/26/2016, 9:31 AM

## 2016-01-26 NOTE — Progress Notes (Signed)
Occupational Therapy Session Note  Patient Details  Name: Patrick [name] MRN: [id number] Date of Birth: October 17, 1955  Today's Date: 01/26/2016 OT Individual Time: [id number] and 1430-1500 OT Individual Time Calculation (min): 58 min and 30 min     Short Term Goals: Week 1:  OT Short Term Goal 1 (Week 1): Pt will demonstrate emergent awareness in context of ADL tasks with min questioning cuing OT Short Term Goal 2 (Week 1): pt will demonstrate selective attention in a moderate distracting environment with min questioning cues OT Short Term Goal 3 (Week 1): Pt will be gather all materials with min questioning cues at ambulatory level. OT Short Term Goal 4 (Week 1): Pt will   Skilled Therapeutic Interventions/Progress Updates:     Session 1: Upon entering the room, pt seated in recliner chair awaiting therapist. Pt continues to verbalize a feeling that he needs to have a BM but declines to sit on toilet. Pt given directions to attempt to locate main entrance that he visited yesterday. Pt needing mod cues in order to follow signs to correct area. Pt seated to rest and OT discussed pt's progress towards goals and answering questions for pt as needed. Pt returning to unit and taking stairs from floor 2 to floor 4 with supervision overall while holding onto 1 rail. Pt returning to sit in recliner chair at end of session. Call bell and all needed items within reach upon exiting the room.   Session 2: Upon entering the room, pt supine in bed with no c/o pain this session. Pt requesting to shower this session. Pt ambulated in room without use of AD to obtain needed items at supervision level. Pt standing for shower this session with close supervision for safety and min verbal cues for safety. Pt declining to remove briefs until he was in shower and he removed once they were fully saturated with shower water. Pt returned to sit on EOB to don hospital gown and returned to supine for therapist to change  pads on hard collar. Pt reporting fatigue and remained in bed with call bell and all needed items within reach. Bed alarm activated.   Therapy Documentation Precautions:  Precautions Precautions: Fall, Cervical Precaution Comments: no nose blowing Required Braces or Orthoses: Cervical Brace Cervical Brace: Hard collar, At all times Restrictions Weight Bearing Restrictions: No General:   Vital Signs: Therapy Vitals Temp: 98.8 F (37.1 C) Temp Source: Oral Pulse Rate: 69 Resp: 18 BP: (!) 144/63 Patient Position (if appropriate): Lying Oxygen Therapy SpO2: 98 % O2 Device: Not Delivered Pain: Pain Assessment Pain Assessment: 0-10 Pain Score: 5  Pain Type: Acute pain Pain Location: Neck Pain Descriptors / Indicators: Aching Pain Intervention(s): RN made aware Multiple Pain Sites: No ADL: ADL ADL Comments: see functional navigator Exercises:   Other Treatments:    See Function Navigator for Current Functional Status.   Therapy/Group: Individual Therapy  Phineas Semen 01/26/2016, 10:02 AM

## 2016-01-27 NOTE — Progress Notes (Signed)
[name]IC[facility] & RE[facility]ABILITATION     PROGRESS NOTE    Subjective/Complaints: Pt sitting up in bed this AM with wife at bedside.  Pt notes he felt his wire pop last night and is now able to open his mouth.    ROS: Denies CP, SOB, N/V/D.  Objective: Vital Signs: Blood pressure 119/62, pulse 75, temperature 99.4 F (37.4 C), temperature source Oral, resp. rate 17, weight 79.1 kg (174 lb 6.9 oz), SpO2 95 %. No results found. No results for input(s): WBC, HGB, HCT, PLT in the last 72 hours. No results for input(s): NA, K, CL, GLUCOSE, BUN, CREATININE, CALCIUM in the last 72 hours.  Invalid input(s): CO CBG (last 3)  No results for input(s): GLUCAP in the last 72 hours.  Wt Readings from Last 3 Encounters:  01/23/16 79.1 kg (174 lb 6.9 oz)  01/05/16 82.4 kg (181 lb 10.5 oz)    Physical Exam:  Constitutional:  He appears well-developed and well-nourished. NAD. HENT: Normocephalic. Resolving wounds.   Eyes: Conjunctivae and EOM are normal.  Neck: Neck immobilized by collar  Respiratory: Effort normal and breath sounds normal. No respiratory distress. He has no wheezes.  GI: Soft. Bowel sounds are normal. He exhibits no distension. There is no tenderness.  Musculoskeletal: He exhibits no edema or tenderness.  Neurological: He is alert and oriented.  Left facial weakness and decrease left lid closure/brow movement.  Mild dysarthria due to dental bars.  Decreased hearing left ear.  UE strength grossly 5/5 with some pain inhibition proximally. LE: 4+/5 hf and ke and 5/5 adf/pf.  Skin: Skin is warm and dry. No erythema.  Psychiatric: Still slightly impulsive and tangential, perseverative (improving).  Assessment/Plan: 1. Functional, cognitive, and mobility deficits secondary to TBI with polytrauma which require 3+ hours per day of interdisciplinary therapy in a comprehensive inpatient rehab setting. Physiatrist is providing close team supervision and 24 hour management  of active medical problems listed below. Physiatrist and rehab team continue to assess barriers to discharge/monitor patient progress toward functional and medical goals.  Function:  Bathing Bathing position   Position: Shower  Bathing parts Body parts bathed by patient: Right arm, Left arm, Chest, Abdomen, Front perineal area, Buttocks, Right upper leg, Left upper leg, Right lower leg, Left lower leg Body parts bathed by helper: Back  Bathing assist Assist Level: Supervision or verbal cues      Upper Body Dressing/Undressing Upper body dressing   What is the patient wearing?: Hospital gown     Pull over shirt/dress - Perfomed by patient: Thread/unthread right sleeve, Thread/unthread left sleeve, Put head through opening, Pull shirt over trunk          Upper body assist Assist Level: Set up      Lower Body Dressing/Undressing Lower body dressing   What is the patient wearing?: Non-skid slipper socks, Hospital Gown Underwear - Performed by patient: Thread/unthread right underwear leg, Thread/unthread left underwear leg, Pull underwear up/down   Pants- Performed by patient: Thread/unthread right pants leg, Thread/unthread left pants leg, Pull pants up/down   Non-skid slipper socks- Performed by patient: Don/doff right sock, Don/doff left sock   Socks - Performed by patient: Don/doff right sock, Don/doff left sock   Shoes - Performed by patient: Don/doff right shoe, Don/doff left shoe, Fasten right, Fasten left            Lower body assist Assist for lower body dressing: Supervision or verbal cues      Psychologist, occupational  Toileting steps completed by patient: Adjust clothing prior to toileting, Adjust clothing after toileting, Performs perineal hygiene Toileting steps completed by helper: Adjust clothing prior to toileting, Performs perineal hygiene Toileting Assistive Devices: Grab bar or rail  Toileting assist Assist level: Supervision or verbal cues    Transfers Chair/bed transfer   Chair/bed transfer method: Ambulatory Chair/bed transfer assist level: Supervision or verbal cues       Locomotion Ambulation     Max distance: 828ft  Assist level: Supervision or verbal cues   Wheelchair Wheelchair activity did not occur: N/A        Cognition Comprehension Comprehension assist level: Understands complex 90% of the time/cues 10% of the time  Expression Expression assist level: Expresses basic 90% of the time/requires cueing < 10% of the time.  Social Interaction Social Interaction assist level: Interacts appropriately 75 - 89% of the time - Needs redirection for appropriate language or to initiate interaction.  Problem Solving Problem solving assist level: Solves basic 75 - 89% of the time/requires cueing 10 - 24% of the time  Memory Memory assist level: Recognizes or recalls 75 - 89% of the time/requires cueing 10 - 24% of the time   Medical Problem List and Plan: 1.  Functional, mobility, and cognitive deficits secondary to TBI/polytrauma after fall down stairs             -continue therapies  -still with issues regarding insight/awareness 2.  DVT Prophylaxis/Anticoagulation: Mechanical: Sequential compression devices, below knee Bilateral lower extremities and mobility  3. HA/Pain Management:   Fentanyl 50 mcg decreased to 12.5 on 10/21, d/c on 10/23  Hydrocodone 7.5mg  4-5 times daily PRN  -continue Topamax for headaches 4. Mood: LCSW to follow for evaluation and support.  5. Neuropsych: This patient is not fully capable of making decisions on his own behalf. 6. Skin/Wound Care: Routine pressure relief measures.  7. Fluids/Electrolytes/Nutrition: Monitor I/O. Continue liquid diet.  8. Leucocytosis:   WBCs 11.5 on 10/18 (trending down)  Will cont to monitor 9. Neurogenic bowel: Miralax changed to daily on 10/20 10. Urinary retention:   -Continue urecholine and flomax  -stated that he needs to urinate standing or on  commode.  -Ua cloudy, Ucx with insig growth 11. Hyponatremia: sodium 134 on 10/18 12. Impaired fating glucose: Check Hgb A1c with next labs  13. Facial fractures/TMJ fracture s/p maxillomandibular fixation: Liquid diet.    -HOB elevated as able and No nose blowing.   -ocean nasal spray for nose             -likely left CN VII injury  -Wire broke night of 10/21, will consult PRS 14. HTN:     -improved control  .LOS (Days) 5 A FACE TO FACE EVALUATION WAS PERFORMED  Vergia Chea Lorie Phenix 01/27/2016 9:00 AM

## 2016-01-27 NOTE — Progress Notes (Signed)
   Plastic Surgery  Patient is 3 weeks post intermaxillary fixation for stabilization right hard palate fracture by Dr. Marla Roe.  Called this am for report intermaxillary fixation wires broken.  Patient alert, notes ability to fully open mouth and no pain with this. Occlusion reported as normal.  On exam, retained food over arch bars. Both intermaxillary wired broken and removed. Interincisal opening > 30 mm, no open bites   A/P CT reviewed. Right hard palate and ptyergoid plate fracture, no mandibular fracture. On exam occlusion normal.  Continue soft diet for now, encouraged oral care.Ok to open mouth. Dr. Marla Roe will assess in am- at this point 3 weeks of fixation. Will discuss replacement IMF vs removal hardware at this time.  Irene Limbo, MD [facility] [phone], pin [phone]

## 2016-01-28 ENCOUNTER — Inpatient Hospital Stay (HOSPITAL_COMMUNITY): Payer: Self-pay | Admitting: Physical Therapy

## 2016-01-28 ENCOUNTER — Inpatient Hospital Stay (HOSPITAL_COMMUNITY): Payer: Managed Care, Other (non HMO) | Admitting: Speech Pathology

## 2016-01-28 ENCOUNTER — Encounter (HOSPITAL_COMMUNITY): Payer: Self-pay

## 2016-01-28 ENCOUNTER — Inpatient Hospital Stay (HOSPITAL_COMMUNITY): Payer: Managed Care, Other (non HMO) | Admitting: Occupational Therapy

## 2016-01-28 ENCOUNTER — Inpatient Hospital Stay (HOSPITAL_COMMUNITY): Payer: Self-pay

## 2016-01-28 ENCOUNTER — Inpatient Hospital Stay (HOSPITAL_COMMUNITY): Payer: Self-pay | Admitting: Occupational Therapy

## 2016-01-28 DIAGNOSIS — S0280XD Fracture of other specified skull and facial bones, unspecified side, subsequent encounter for fracture with routine healing: Secondary | ICD-10-CM

## 2016-01-28 DIAGNOSIS — G8918 Other acute postprocedural pain: Secondary | ICD-10-CM

## 2016-01-28 NOTE — Progress Notes (Signed)
  Subjective: Patient seen for post operative follow up following MMF for hard palate fracture. His wires had broken over the weekend.  He has been on soft foods, no chew diet.  He reports little pain.   Bands were applied and the patient will need to continue these for the next several weeks. He needs to continue on liquid diet.  May brush teeth and use water pik for dental hygiene.  Will plan for removal of his MMF in about 3 weeks.   Objective: Vital signs in last 24 hours: Temp:  [97.9 F (36.6 C)-99.6 F (37.6 C)] 97.9 F (36.6 C) (10/23 1300) Pulse Rate:  [77-98] 95 (10/23 1300) Resp:  [16-18] 16 (10/23 1300) BP: (137-150)/(80-87) 140/82 (10/23 1300) SpO2:  [96 %-98 %] 98 % (10/23 1300) Last BM Date: 01/28/16  Intake/Output from previous day: 10/22 0701 - 10/23 0700 In: 750 [P.O.:750] Out: 1100 [Urine:1100] Intake/Output this shift: Total I/O In: 700 [P.O.:700] Out: -   General appearance: alert, cooperative and no distress The MMF remains in place. Bands were able to be placed. Dental hygiene is poor and patient was encouraged to brush and use water pik  Lab Results:  No results for input(s): WBC, HGB, HCT, PLT in the last 72 hours. BMET No results for input(s): NA, K, CL, CO2, GLUCOSE, BUN, CREATININE, CALCIUM in the last 72 hours. PT/INR No results for input(s): LABPROT, INR in the last 72 hours. ABG No results for input(s): PHART, HCO3 in the last 72 hours.  Invalid input(s): PCO2, PO2  Studies/Results: No results found.  Anti-infectives: Anti-infectives    None      Assessment/Plan: S/p intermaxillary fixation for hard palate fracture about 3 weeks ago Bands were applied and the patient will need to continue these for the next several weeks. He needs to continue on liquid diet.  May brush teeth and use water pik for dental hygiene.  Will plan for removal of his MMF in about 3 weeks.     LOS: 6 days    RAYBURN,SHAWN,PA-C Plastic  Surgery [phone]

## 2016-01-28 NOTE — Progress Notes (Signed)
Speech Language Pathology Daily Session Note  Patient Details  Name: Patrick [name] MRN: [id number] Date of Birth: 1955/09/16  Today's Date: 01/28/2016 SLP Individual Time: 1400-1430 SLP Individual Time Calculation (min): 30 min   Short Term Goals: Week 1: SLP Short Term Goal 1 (Week 1): Patient will identify 2 cognitive deficits and their impact on personal safety given supervision question cues. SLP Short Term Goal 2 (Week 1): Patient will demonstrate short-term recall of new, daily information given supervision question cues. SLP Short Term Goal 3 (Week 1): Patient will demonstrate mildly complex problem solving given supervision verbal cues.   Skilled Therapeutic Interventions:Skilled therapy intervention focused on cognitive goals. At the start of session patient went to the bathroom and was able to void. Patient independently expressed anticipatory awareness of cognitive deficits and the functional daily modifications that will be necessary upon d/c. Patient and patient's step-daughter educated on cognitive limitations and the importance of establishing a new home routine. Patient and family member expressed understanding and no questions at this time. Patient returned to room with all needs within reach. Continue current plan of care.   Function:  Cognition Comprehension Comprehension assist level: Understands complex 90% of the time/cues 10% of the time  Expression   Expression assist level: Expresses complex 90% of the time/cues < 10% of the time  Social Interaction Social Interaction assist level: Interacts appropriately 90% of the time - Needs monitoring or encouragement for participation or interaction.  Problem Solving Problem solving assist level: Solves basic 90% of the time/requires cueing < 10% of the time  Memory Memory assist level: Recognizes or recalls 90% of the time/requires cueing < 10% of the time    Pain Pain Assessment Pain Assessment: No/denies  pain  Therapy/Group: Individual Therapy  Thornton Papas 01/28/2016, 5:06 PM

## 2016-01-28 NOTE — Progress Notes (Signed)
Occupational Therapy Session Note  Patient Details  Name: Patrick [name] MRN: [id number] Date of Birth: 03-22-1956  Today's Date: 01/28/2016 OT Individual Time: 1131-1204 OT Individual Time Calculation (min): 33 min   Short Term Goals: Week 1:  OT Short Term Goal 1 (Week 1): Pt will demonstrate emergent awareness in context of ADL tasks with min questioning cuing OT Short Term Goal 2 (Week 1): pt will demonstrate selective attention in a moderate distracting environment with min questioning cues OT Short Term Goal 3 (Week 1): Pt will be gather all materials with min questioning cues at ambulatory level. OT Short Term Goal 4 (Week 1): Pt will     Skilled Therapeutic Interventions/Progress Updates:   Pt was with stepdaughter Mardene Celeste at time of arrival with request for family education. Mardene Celeste was provided with handout on method for donning/doffing cervical collar. Hands on training completed with pt  Supine and HOB flat. Mardene Celeste was receptive to training and demonstrated carryover of education with practice. At end of tx, pt was left with Mardene Celeste and all needs within reach.  Therapy Documentation Precautions:  Precautions Precautions: Fall, Cervical Precaution Comments: no nose blowing Required Braces or Orthoses: Cervical Brace Cervical Brace: Hard collar, At all times Restrictions Weight Bearing Restrictions: No  Pain: No c/o pain during session  Pain Assessment Pain Assessment: 0-10 Pain Score: 8  Pain Type: Chronic pain Pain Location: Head Pain Orientation: Right;Left Pain Descriptors / Indicators: Headache Pain Frequency: Constant Pain Onset: On-going Pain Intervention(s): Medication (See eMAR) Multiple Pain Sites: Yes 2nd Pain Site Pain Type: Acute pain Pain Location: Ankle (bilateral) ADL: ADL ADL Comments: see functional navigator    See Function Navigator for Current Functional Status.   Therapy/Group: Individual Therapy  Addeline Calarco A  Ramari Bray 01/28/2016, 12:58 PM

## 2016-01-28 NOTE — Care Management Note (Signed)
[facility] Individual Statement of Services  Patient Name:  Patrick [name]  Date:  01/24/2016  Welcome to the [facility].  Our goal is to provide you with an individualized program based on your diagnosis and situation, designed to meet your specific needs.  With this comprehensive rehabilitation program, you will be expected to participate in at least 3 hours of rehabilitation therapies Monday-Friday, with modified therapy programming on the weekends.  Your rehabilitation program will include the following services:  Physical Therapy (PT), Occupational Therapy (OT), Speech Therapy (ST), 24 hour per day rehabilitation nursing, Therapeutic Recreaction (TR), Neuropsychology, Case Management (Social Worker), Rehabilitation Medicine, Nutrition Services and Pharmacy Services  Weekly team conferences will be held on Wednesdays to discuss your progress.  Your Social Worker will talk with you frequently to get your input and to update you on team discussions.  Team conferences with you and your family in attendance may also be held.  Expected length of stay: 7-10 days  Overall anticipated outcome: supervision  Depending on your progress and recovery, your program may change. Your Social Worker will coordinate services and will keep you informed of any changes. Your Social Worker's name and contact numbers are listed  below.  The following services may also be recommended but are not provided by the [facility] will be made to provide these services after discharge if needed.  Arrangements include referral to agencies that provide these services.  Your insurance has been verified to be:  [employer] Your primary doctor is:  TBD (wife checking)  Pertinent information will be shared with your doctor  and your insurance company.  Social Worker:  [place], [place] or ([phone]   Information discussed with and copy given to patient by: Lennart Pall, 01/24/2016, 1:21 PM

## 2016-01-28 NOTE — Progress Notes (Signed)
Physical Therapy Discharge Summary  Patient Details  Name: Patrick [name] MRN: [id number] Date of Birth: [birth year]  Today's Date: 01/28/2016 PT Individual Time:1309-1400 and 4235-3614 PT Individual Time Calculation (min): 51 min and 20 min   Patient has met 12 of 12 long term goals due to improved balance, improved postural control, increased strength, decreased pain, improved attention and improved awareness.  Patient to discharge at an ambulatory level Modified Independent.   Patient's care partner is independent to provide the necessary cognitive and supervision assistance at discharge.  Reasons goals not met: All goals met  Recommendation:  Patient will benefit from ongoing skilled PT services in HHPT and then outpatient setting once Aspen collar D/C by physician to continue to advance safe functional mobility, address ongoing impairments in UE/cervical ROM and strengthening, safety/cognition, dynamic balance and gait, and minimize fall risk.  Equipment: No equipment provided  Reasons for discharge: treatment goals met and discharge from hospital  Patient/family agrees with progress made and goals achieved: Yes  PT Discharge Pt received seated EOB with daughter present.  Pt participated in skilled PT sessions with focus on re-assessment of LE strength and sensation and balance with BERG balance assessment as documented below.  Demonstrated to pt how to perform floor <> furniture transfer and indications for calling EMS if pt experiences a fall; pt return demonstrated mod I.  Pt demonstrated safe transfer in/out of simulated car Mod I and bed mobility on flat bed, no rail Mod I.  Discussed stairs at home; pt with 2 STE house, no rails and has full flight down to basement, rails not re-installed yet.  Pt states he will not go into the basement until rails re-installed.  Pt performed up/down 2-3 stairs without UE support on rails with supervision and negotiated full flight of stairs x 2  reps with supervision with one UE support on rail.  At end of therapy session pt returned to room and provided with heat pack to place on shoulders for pain management; pt handed off to SLP.  Second session: pt reporting fatigue after OT session but willing to participate.  Provided pt with handout of OTAGO dynamic standing balance exercises, reviewed each exercise with pt and pt gave repeat demonstration of each exercise.  At end of session pt left in recliner with all items within reach.  No further questions or concerns.    Precautions/RestrictionsRestrictions Weight Bearing Restrictions: No Vital Signs Therapy Vitals Temp: 97.9 F (36.6 C) Temp Source: Oral Pulse Rate: 95 Resp: 16 BP: 140/82 Patient Position (if appropriate): Standing Oxygen Therapy SpO2: 98 % O2 Device: Not Delivered Pain Pain Assessment Pain Assessment: 0-10 Pain Score: 8  Pain Type: Acute pain Pain Location: Head Pain Descriptors / Indicators: Constant;Headache Pain Frequency: Constant Pain Onset: On-going Pain Intervention(s): Medication (See eMAR) Cognition Orientation Level: Oriented X4 Sensation Sensation Light Touch: Impaired by gross assessment Stereognosis: Appears Intact Hot/Cold: Appears Intact Proprioception: Appears Intact Additional Comments: Pt reports numbness in lower leg and feet; reports poor circulation Coordination Gross Motor Movements are Fluid and Coordinated: Not tested Fine Motor Movements are Fluid and Coordinated: Not tested Motor  Motor Motor: Within Functional Limits  Mobility Bed Mobility Bed Mobility: Supine to Sit;Sit to Supine Supine to Sit: 6: Modified independent (Device/Increase time) Sit to Supine: 6: Modified independent (Device/Increase time) Transfers Transfers: Yes Stand Pivot Transfers: 6: Modified independent (Device/Increase time) Locomotion  Ambulation Ambulation/Gait Assistance: 6: Modified independent (Device/Increase time) Ambulation Distance  (Feet): 150 Feet Assistive device: None Stairs / Additional  Locomotion Stairs: Yes Stairs Assistance: 5: Supervision Stair Management Technique: One rail Right Number of Stairs: 24 Wheelchair Mobility Wheelchair Mobility: No  Trunk/Postural Assessment  Cervical Assessment Cervical Assessment:  (in aspen collar) Thoracic Assessment Thoracic Assessment: Within Functional Limits Lumbar Assessment Lumbar Assessment: Within Functional Limits Postural Control Postural Control:  (delayed)  Balance Standardized Balance Assessment Standardized Balance Assessment: Berg Balance Test Berg Balance Test Sit to Stand: Able to stand without using hands and stabilize independently Standing Unsupported: Able to stand safely 2 minutes Sitting with Back Unsupported but Feet Supported on Floor or Stool: Able to sit safely and securely 2 minutes Stand to Sit: Sits safely with minimal use of hands Transfers: Able to transfer safely, minor use of hands Standing Unsupported with Eyes Closed: Able to stand 10 seconds safely Standing Ubsupported with Feet Together: Able to place feet together independently and stand 1 minute safely From Standing, Reach Forward with Outstretched Arm: Can reach confidently >25 cm (10") From Standing Position, Pick up Object from Floor: Able to pick up shoe safely and easily From Standing Position, Turn to Look Behind Over each Shoulder: Looks behind from both sides and weight shifts well Turn 360 Degrees: Able to turn 360 degrees safely in 4 seconds or less Standing Unsupported, Alternately Place Feet on Step/Stool: Able to stand independently and safely and complete 8 steps in 20 seconds Standing Unsupported, One Foot in Front: Able to place foot tandem independently and hold 30 seconds Standing on One Leg: Able to lift leg independently and hold > 10 seconds Total Score: 56  Patient demonstrates low fall risk as noted by score of 56/56 on Berg Balance Scale.  (<36= high  risk for falls, close to 100%; 37-45 significant >80%; 46-51 moderate >50%; 52-55 lower >25%)  Extremity Assessment   RLE Assessment RLE Assessment: Within Functional Limits LLE Assessment LLE Assessment: Within Functional Limits   See Function Navigator for Current Functional Status.  Raylene Everts Faucette 01/28/2016, 4:44 PM

## 2016-01-28 NOTE — Progress Notes (Signed)
Occupational Therapy Session Note  Patient Details  Name: Patrick [name] MRN: [id number] Date of Birth: 07-Aug-1955  Today's Date: 01/28/2016 OT Individual Time: 1000-1100 OT Individual Time Calculation (min): 60 min     Short Term Goals: Week 1:  OT Short Term Goal 1 (Week 1): Pt will demonstrate emergent awareness in context of ADL tasks with min questioning cuing OT Short Term Goal 2 (Week 1): pt will demonstrate selective attention in a moderate distracting environment with min questioning cues OT Short Term Goal 3 (Week 1): Pt will be gather all materials with min questioning cues at ambulatory level. OT Short Term Goal 4 (Week 1): Pt will   Skilled Therapeutic Interventions/Progress Updates:    Pt declined bathing/dressing tasks this morning, stating that he needed to walk and exercise since he didn't do much over the weekend.  Pt agreeable to engaging in bathing/dressing tasks during his afternoon OT session.  Pt engaged in functional amb tasks for simple home mgmt tasks with focus on increased balance and activity tolerance.  Pt also engaged in pathfinding activities.  Pt requested to use the toilet X 3 with no positive results.  Focus on activity tolerance, sit<>stand, standing balance, functional amb without AD, cognitive remediation, and safety awareness to increase independence with BADLs.  Therapy Documentation Precautions:  Precautions Precautions: Fall, Cervical Precaution Comments: no nose blowing Required Braces or Orthoses: Cervical Brace Cervical Brace: Hard collar, At all times Restrictions Weight Bearing Restrictions: No General:   Vital Signs:  Pain: Pain Assessment Pain Assessment: 0-10 Pain Score: 8  Pain Type: Chronic pain Pain Location: Head Pain Orientation: Right;Left Pain Descriptors / Indicators: Headache Pain Frequency: Constant Pain Onset: On-going Pain Intervention(s): Medication (See eMAR) Multiple Pain Sites: Yes 2nd Pain Site Pain  Type: Acute pain Pain Location: Ankle (bilateral) ADL: ADL ADL Comments: see functional navigator Exercises:   Other Treatments:    See Function Navigator for Current Functional Status.   Therapy/Group: Individual Therapy  Leroy Libman 01/28/2016, 12:09 PM

## 2016-01-28 NOTE — Progress Notes (Signed)
Occupational Therapy Discharge Summary  Patient Details  Name: Patrick [name] MRN: [id number] Date of Birth: Jun 11, 1955  Today's Date: 01/28/2016 OT Individual Time: 1430-1526 OT Individual Time Calculation (min): 56 min     Patient has met 11 of 11 long term goals due to improved activity tolerance, improved balance, ability to compensate for deficits, improved attention, improved awareness and improved coordination.  Patient to discharge at overall Modified Independent level.     Reasons goals not met: all goals met  Recommendation:  No recommendation for follow up OT services at this time.   Equipment: No equipment provided  Reasons for discharge: treatment goals met  Patient/family agrees with progress made and goals achieved: Yes   OT Intervention: Pt engaged in OT intervention by demonstration ability to perform all self care tasks, transfers, and mobility without AD at mod I level. Pt has been made Mod I in room as well. OT discussed discharge recommendation to pt and pt verbalized understanding. OT answering any questions pt had at this time. Pt returned to supine in bed for OT to change hard collar pads from shower safely. He remained in bed with call bell and all needed items within reach.   OT Discharge Precautions/Restrictions Precautions Precautions: Fall;Cervical Required Braces or Orthoses: Cervical Brace Cervical Brace: Hard collar;At all times Restrictions Weight Bearing Restrictions: No General   Vital Signs   Pain Pain Assessment Pain Assessment: No/denies pain ADL ADL ADL Comments: see functional navigator Vision/Perception  Vision- History Baseline Vision/History: Wears glasses Wears Glasses: Reading only Patient Visual Report: No change from baseline  Cognition Orientation Level: Oriented X4 Sensation Sensation Light Touch: Impaired by gross assessment Stereognosis: Appears Intact Hot/Cold: Appears Intact Proprioception: Appears  Intact Additional Comments: Pt reports numbness in lower leg and feet; reports poor circulation Coordination Gross Motor Movements are Fluid and Coordinated: Not tested Fine Motor Movements are Fluid and Coordinated: Not tested Motor  Motor Motor: Within Functional Limits Mobility  Bed Mobility Bed Mobility: Supine to Sit;Sit to Supine Supine to Sit: 6: Modified independent (Device/Increase time) Sit to Supine: 6: Modified independent (Device/Increase time)  Trunk/Postural Assessment  Cervical Assessment Cervical Assessment: Exceptions to [facility] (hard collar) Thoracic Assessment Thoracic Assessment: Within Functional Limits Lumbar Assessment Lumbar Assessment: Within Functional Limits Postural Control Postural Control: Deficits on evaluation  Balance Balance Balance Assessed: Yes Standardized Balance Assessment Standardized Balance Assessment: Berg Balance Test Berg Balance Test Sit to Stand: Able to stand without using hands and stabilize independently Standing Unsupported: Able to stand safely 2 minutes Sitting with Back Unsupported but Feet Supported on Floor or Stool: Able to sit safely and securely 2 minutes Stand to Sit: Sits safely with minimal use of hands Transfers: Able to transfer safely, minor use of hands Standing Unsupported with Eyes Closed: Able to stand 10 seconds safely Standing Ubsupported with Feet Together: Able to place feet together independently and stand 1 minute safely From Standing, Reach Forward with Outstretched Arm: Can reach confidently >25 cm (10") From Standing Position, Pick up Object from Floor: Able to pick up shoe safely and easily From Standing Position, Turn to Look Behind Over each Shoulder: Looks behind from both sides and weight shifts well Turn 360 Degrees: Able to turn 360 degrees safely in 4 seconds or less Standing Unsupported, Alternately Place Feet on Step/Stool: Able to stand independently and safely and complete 8 steps in 20  seconds Standing Unsupported, One Foot in Front: Able to place foot tandem independently and hold 30 seconds Standing on One  Leg: Able to lift leg independently and hold > 10 seconds Total Score: 56 Static Standing Balance Static Standing - Balance Support: During functional activity Static Standing - Level of Assistance: 6: Modified independent (Device/Increase time) Dynamic Standing Balance Dynamic Standing - Level of Assistance: 6: Modified independent (Device/Increase time) Extremity/Trunk Assessment RUE Assessment RUE Assessment: Within Functional Limits LUE Assessment LUE Assessment: Within Functional Limits   See Function Navigator for Current Functional Status.  Phineas Semen 01/28/2016, 5:03 PM

## 2016-01-28 NOTE — Progress Notes (Signed)
[facility] PHYSICAL MEDICINE & REHABILITATION     PROGRESS NOTE    Subjective/Complaints: Pt sitting up in his chair this AM.  He is keeping his mouth closed, awaiting PRS eval for further plan.   ROS: Denies CP, SOB, N/V/D.  Objective: Vital Signs: Blood pressure (!) 150/80, pulse 77, temperature 99.2 F (37.3 C), temperature source Oral, resp. rate 18, weight 79.1 kg (174 lb 6.9 oz), SpO2 98 %. No results found. No results for input(s): WBC, HGB, HCT, PLT in the last 72 hours. No results for input(s): NA, K, CL, GLUCOSE, BUN, CREATININE, CALCIUM in the last 72 hours.  Invalid input(s): CO CBG (last 3)  No results for input(s): GLUCAP in the last 72 hours.  Wt Readings from Last 3 Encounters:  01/23/16 79.1 kg (174 lb 6.9 oz)  01/05/16 82.4 kg (181 lb 10.5 oz)    Physical Exam:  Constitutional:  He appears well-developed and well-nourished. NAD. HENT: Normocephalic. Resolving wounds.  Able to open mouth now. Eyes: Conjunctivae and EOM are normal.  Neck: Neck immobilized by collar  Respiratory: Effort normal and breath sounds normal. No respiratory distress. He has no wheezes.  GI: Soft. Bowel sounds are normal. He exhibits no distension. There is no tenderness.  Musculoskeletal: He exhibits no edema or tenderness.  Neurological: He is alert and oriented.  Left facial weakness and decrease left lid closure/brow movement.  Mild dysarthria due to dental bars.  Decreased hearing left ear.  UE strength grossly 5/5 with some pain inhibition proximally. LE: 4+/5 hf and ke and 5/5 adf/pf.  Skin: Skin is warm and dry. No erythema.  Psychiatric: Still slightly impulsive and tangential, perseverative (improving).  Assessment/Plan: 1. Functional, cognitive, and mobility deficits secondary to TBI with polytrauma which require 3+ hours per day of interdisciplinary therapy in a comprehensive inpatient rehab setting. Physiatrist is providing close team supervision and 24 hour  management of active medical problems listed below. Physiatrist and rehab team continue to assess barriers to discharge/monitor patient progress toward functional and medical goals.  Function:  Bathing Bathing position   Position: Shower  Bathing parts Body parts bathed by patient: Right arm, Left arm, Chest, Abdomen, Front perineal area, Buttocks, Right upper leg, Left upper leg, Right lower leg, Left lower leg Body parts bathed by helper: Back  Bathing assist Assist Level: Supervision or verbal cues      Upper Body Dressing/Undressing Upper body dressing   What is the patient wearing?: Hospital gown     Pull over shirt/dress - Perfomed by patient: Thread/unthread right sleeve, Thread/unthread left sleeve, Put head through opening, Pull shirt over trunk          Upper body assist Assist Level: Set up      Lower Body Dressing/Undressing Lower body dressing   What is the patient wearing?: Non-skid slipper socks, Hospital Gown Underwear - Performed by patient: Thread/unthread right underwear leg, Thread/unthread left underwear leg, Pull underwear up/down   Pants- Performed by patient: Thread/unthread right pants leg, Thread/unthread left pants leg, Pull pants up/down   Non-skid slipper socks- Performed by patient: Don/doff right sock, Don/doff left sock   Socks - Performed by patient: Don/doff right sock, Don/doff left sock   Shoes - Performed by patient: Don/doff right shoe, Don/doff left shoe, Fasten right, Fasten left            Lower body assist Assist for lower body dressing: Supervision or verbal cues      Toileting Toileting   Toileting steps completed  by patient: Adjust clothing prior to toileting, Adjust clothing after toileting, Performs perineal hygiene Toileting steps completed by helper: Adjust clothing prior to toileting, Performs perineal hygiene Toileting Assistive Devices: Grab bar or rail  Toileting assist Assist level: Supervision or verbal cues    Transfers Chair/bed transfer   Chair/bed transfer method: Ambulatory Chair/bed transfer assist level: Supervision or verbal cues       Locomotion Ambulation     Max distance: 861ft  Assist level: Supervision or verbal cues   Wheelchair Wheelchair activity did not occur: N/A        Cognition Comprehension Comprehension assist level: Understands complex 90% of the time/cues 10% of the time  Expression Expression assist level: Expresses basic 90% of the time/requires cueing < 10% of the time.  Social Interaction Social Interaction assist level: Interacts appropriately 75 - 89% of the time - Needs redirection for appropriate language or to initiate interaction.  Problem Solving Problem solving assist level: Solves basic 75 - 89% of the time/requires cueing 10 - 24% of the time  Memory Memory assist level: Recognizes or recalls 75 - 89% of the time/requires cueing 10 - 24% of the time   Medical Problem List and Plan: 1.  Functional, mobility, and cognitive deficits secondary to TBI/polytrauma after fall down stairs             -continue therapies  -still with issues regarding insight/awareness (improving) 2.  DVT Prophylaxis/Anticoagulation: Mechanical: Sequential compression devices, below knee Bilateral lower extremities and mobility  3. HA/Pain Management:   Fentanyl 50 mcg decreased to 12.5 on 10/21, d/ced on 10/23  Hydrocodone 7.5mg  4-5 times daily PRN  Continue Topamax for headaches 4. Mood: LCSW to follow for evaluation and support.  5. Neuropsych: This patient is not fully capable of making decisions on his own behalf. 6. Skin/Wound Care: Routine pressure relief measures.  7. Fluids/Electrolytes/Nutrition: Monitor I/O. Continue liquid diet.  8. Leucocytosis:   WBCs 11.5 on 10/18 (trending down)  Will cont to monitor 9. Neurogenic bowel: Miralax changed to daily on 10/20 10. Urinary retention:   -Continue urecholine and flomax  -stated that he needs to urinate standing  or on commode.  -Ua cloudy, Ucx with insig growth 11. Hyponatremia: sodium 134 on 10/18 12. Impaired fating glucose: Check Hgb A1c with next labs  13. Facial fractures/TMJ fracture s/p maxillomandibular fixation: Liquid diet.    -HOB elevated as able and No nose blowing.   -ocean nasal spray for nose             -likely left CN VII injury  -Wire broke night of 10/21, appreciate PRS eval, await further plans for wiring vs. Hardware removal 14. HTN:     -improved control  .LOS (Days) 6 A FACE TO FACE EVALUATION WAS PERFORMED  Adriann Ballweg Lorie Phenix 01/28/2016 9:22 AM

## 2016-01-29 DIAGNOSIS — D72820 Lymphocytosis (symptomatic): Secondary | ICD-10-CM

## 2016-01-29 MED ORDER — METHOCARBAMOL 500 MG PO TABS: 500.0000 mg | ORAL_TABLET | Freq: Four times a day (QID) | ORAL | 0 refills | Status: DC | PRN

## 2016-01-29 MED ORDER — TOPIRAMATE 50 MG PO TABS: 50.0000 mg | ORAL_TABLET | Freq: Every day | ORAL | 0 refills | Status: DC

## 2016-01-29 MED ORDER — ADULT MULTIVITAMIN LIQUID CH: 15.0000 mL | Freq: Every day | ORAL | 0 refills | Status: DC

## 2016-01-29 MED ORDER — HYDROCODONE-ACETAMINOPHEN 7.5-325 MG/15ML PO SOLN: 10.0000 mL | ORAL | 0 refills | Status: DC | PRN

## 2016-01-29 MED ORDER — TAMSULOSIN HCL 0.4 MG PO CAPS: 0.4000 mg | ORAL_CAPSULE | Freq: Every day | ORAL | 0 refills | Status: DC

## 2016-01-29 MED ORDER — BETHANECHOL CHLORIDE 25 MG PO TABS: 25.0000 mg | ORAL_TABLET | Freq: Four times a day (QID) | ORAL | 0 refills | Status: DC

## 2016-01-29 NOTE — Progress Notes (Signed)
Patient and family received discharge instructions and prescriptions from this RN with verbal understanding. Patient discharged to home with family and belongings. All discharge questions answered at this time. Marlowe Shores, PA-C left cell phone number with patient's spouse in the case of further questions.

## 2016-01-29 NOTE — Progress Notes (Signed)
[place] PHYSICAL MEDICINE & REHABILITATION     PROGRESS NOTE    Subjective/Complaints: Pt sitting up in bed this AM.  He slept well overnight.  He is ready to go home today.  He was seen by [facility] yesterday and plan for follow up as outpatient, rubber bands, and cont to brush teeth.  ROS: Denies CP, SOB, N/V/D.  Objective: Vital Signs: Blood pressure (!) 141/84, pulse 81, temperature 99.1 F (37.3 C), temperature source Oral, resp. rate 18, weight 79.1 kg (174 lb 6.9 oz), SpO2 97 %. No results found. No results for input(s): WBC, HGB, HCT, PLT in the last 72 hours. No results for input(s): NA, K, CL, GLUCOSE, BUN, CREATININE, CALCIUM in the last 72 hours.  Invalid input(s): CO CBG (last 3)  No results for input(s): GLUCAP in the last 72 hours.  Wt Readings from Last 3 Encounters:  01/23/16 79.1 kg (174 lb 6.9 oz)  01/05/16 82.4 kg (181 lb 10.5 oz)    Physical Exam:  Constitutional:  He appears well-developed and well-nourished. NAD. HENT: Normocephalic. Resolving wounds.  Able to open mouth now. Eyes: Conjunctivae and EOM are normal.  Neck: Neck immobilized by collar  Respiratory: Effort normal and breath sounds normal. No respiratory distress. He has no wheezes.  GI: Soft. Bowel sounds are normal. He exhibits no distension. There is no tenderness.  Musculoskeletal: He exhibits no edema or tenderness.  Neurological: He is alert and oriented.  Left facial weakness and decrease left lid closure/brow movement.  Decreased hearing left ear.  UE strength grossly 5/5 with some pain inhibition proximally.  LE: 4+/5 hf and ke and 5/5 adf/pf.  Skin: Skin is warm and dry. No erythema.  Psychiatric: Still slightly impulsive and tangential, perseverative (improving).  Assessment/Plan: 1. Functional, cognitive, and mobility deficits secondary to TBI with polytrauma which require 3+ hours per day of interdisciplinary therapy in a comprehensive inpatient rehab setting. Physiatrist is  providing close team supervision and 24 hour management of active medical problems listed below. Physiatrist and rehab team continue to assess barriers to discharge/monitor patient progress toward functional and medical goals.  Function:  Bathing Bathing position   Position: Shower  Bathing parts Body parts bathed by patient: Right arm, Left arm, Chest, Abdomen, Front perineal area, Buttocks, Right upper leg, Left upper leg, Right lower leg, Left lower leg Body parts bathed by helper: Back  Bathing assist Assist Level: More than reasonable time      Upper Body Dressing/Undressing Upper body dressing   What is the patient wearing?: Pull over shirt/dress     Pull over shirt/dress - Perfomed by patient: Thread/unthread right sleeve, Thread/unthread left sleeve, Put head through opening, Pull shirt over trunk          Upper body assist Assist Level: More than reasonable time      Lower Body Dressing/Undressing Lower body dressing   What is the patient wearing?: Non-skid slipper socks, Pants Underwear - Performed by patient: Thread/unthread right underwear leg, Thread/unthread left underwear leg, Pull underwear up/down   Pants- Performed by patient: Thread/unthread right pants leg, Thread/unthread left pants leg, Pull pants up/down, Fasten/unfasten pants   Non-skid slipper socks- Performed by patient: Don/doff right sock, Don/doff left sock   Socks - Performed by patient: Don/doff right sock, Don/doff left sock   Shoes - Performed by patient: Don/doff right shoe, Don/doff left shoe, Fasten right, Fasten left            Lower body assist Assist for lower body  dressing: More than reasonable time      Toileting Toileting   Toileting steps completed by patient: Adjust clothing prior to toileting, Adjust clothing after toileting, Performs perineal hygiene Toileting steps completed by helper: Adjust clothing prior to toileting, Performs perineal hygiene Toileting Assistive  Devices: Grab bar or rail  Toileting assist Assist level: More than reasonable time   Transfers Chair/bed transfer   Chair/bed transfer method: Ambulatory Chair/bed transfer assist level: No Help, no cues, assistive device, takes more than a reasonable amount of time Chair/bed transfer assistive device: Orthosis     Locomotion Ambulation     Max distance: 300 Assist level: No help, No cues, assistive device, takes more than a reasonable amount of time   Wheelchair Wheelchair activity did not occur: N/A        Cognition Comprehension Comprehension assist level: Understands complex 90% of the time/cues 10% of the time  Expression Expression assist level: Expresses complex 90% of the time/cues < 10% of the time  Social Interaction Social Interaction assist level: Interacts appropriately 90% of the time - Needs monitoring or encouragement for participation or interaction.  Problem Solving Problem solving assist level: Solves basic 90% of the time/requires cueing < 10% of the time  Memory Memory assist level: Recognizes or recalls 90% of the time/requires cueing < 10% of the time   Medical Problem List and Plan: 1.  Functional, mobility, and cognitive deficits secondary to TBI/polytrauma after fall down stairs  D/c today, will see pt for transitional care management in 1-2 weeks  -still with issues regarding insight/awareness (improving) 2.  DVT Prophylaxis/Anticoagulation: Mechanical: Sequential compression devices, below knee Bilateral lower extremities and mobility  3. HA/Pain Management:   Fentanyl 50 mcg decreased to 12.5 on 10/21, d/ced on 10/23.  Patient appears to be tolerating  Hydrocodone 7.5mg  4-5 times daily PRN  Continue Topamax for headaches 4. Mood: LCSW to follow for evaluation and support.  5. Neuropsych: This patient is not fully capable of making decisions on his own behalf. 6. Skin/Wound Care: Routine pressure relief measures.  7. Fluids/Electrolytes/Nutrition:  Monitor I/O. Continue liquid diet.  8. Leucocytosis:   WBCs 11.5 on 10/18 (trending down)  Will cont to monitor 9. Neurogenic bowel: Miralax changed to daily on 10/20 10. Urinary retention:   -Continue urecholine and flomax  -stated that he needs to urinate standing or on commode.  -Ua cloudy, Ucx with insig growth 11. Hyponatremia: sodium 134 on 10/18 12. Impaired fating glucose  13. Facial fractures/TMJ fracture s/p maxillomandibular fixation: Liquid diet.    -HOB elevated as able and No nose blowing.   -ocean nasal spray for nose             -likely left CN VII injury  -Wire broke night of 10/21, appreciate PRS eval, plan follow up as outpt for hardware removal.  Pt provided rubber bands 14. HTN:     -stable at present  .LOS (Days) 7 A FACE TO FACE EVALUATION WAS PERFORMED  Mark Hassey Lorie Phenix 01/29/2016 10:02 AM

## 2016-01-29 NOTE — Consult Note (Signed)
  NEUROCOGNITIVE TESTING - [facility]   MEDICAL NECESSITY:  Patrick [name] was seen on the [facility] [facility] for neurocognitive testing owing to the patient's diagnosis of TBI.   Records indicate that Patrick [name] is a "60 y.o. male who fell down 7 stairs and landed on his face.  Patient intoxicated with amnesia of events and ETOH level 285. He was evaluated in ED on 01/05/16 and CT head done showing multiple facial fractures, basilar skull fracture and acute SAH/IPH along anterior frontal region. Dr. Arnoldo Morale evaluated patient and recommended monitoring as patient at risk for CSF bleed.He developed severe HA as well as clear drainage from [nose] due to CSF leak. He was placed on bedrest without resolution of symptoms and CT head done 10/9 showing resolving IPH with significant left frontotemporal edema, layering fluid/complete opacification of L>R sphenoid sinus most likely area of CSF leak.Therapy ongoing and CIR recommended due to cognitive deficits, impulsivity, poor safety due to TBI."    During today's visit, Patrick [name] reported having no memory of the fall and TBI that he sustained. No major RA endorsed, though greater than 24 hours of PTA was reported with LOC of several hours. Since then, he has been experiencing mild memory difficulties that he feels may also be related to certain medication side effects (i.e., pain management drugs).   From an emotional standpoint, Patrick [name] expressed concern about his job status, as he was not paid last pay period. He has been generally stressed and mildly anxious. He is not being treated in any fashion for mood symptoms. However, in general, he feels that he has been doing well. He has no history of depression or anxiety. Of note, Patrick [name] was intoxicated when the event took place. He was out with his wife and then at a party. Upon query, he mentioned that he is unsure whether he has any issues with  alcohol. He drinks alcohol regularly but in small quantities, though he will sometime drink more on weekends or at parties.   Patrick [name] feels that he has been making progress in therapy. No major barriers to therapy identified. He described the rehab staff as "good." He has his wife, family and friends to support him throughout this endeavor. He was working in [employer]. He is unsure of what is happening with that situation and he planned to follow-up with work today.   The patient was referred for neuropsychological consultation given the possibility of cognitive sequelae subsequent to the current medical status and in order to assist in treatment planning.   PROCEDURES: [2 units of 96118]  Diagnostic Interview Medical record review Behavioral observations  Neuropsychological testing   IMPRESSION: Overall, Patrick [name] endorsed experiencing mild memory issues. Neurocognitive testing indicated intact language, praxis, visuospatial/constructional abilities, and learning. Free recall, processing speed, attention, and letter fluency were reduced (or impaired). This issues indicative deficits with frontal/subcortical systems and are consistent with his injury. Emotionally, he is experiencing a mild adjustment reaction to his present circumstances. Given these findings, I recommend undergoing comprehensive neuropsychological evaluation upon discharge. I think that mood symptoms will naturally abate as he recovers.      Rutha Bouchard, Psy.D., [place] Neuropsychologist  Rehabilitation Psychologist

## 2016-01-29 NOTE — Progress Notes (Signed)
Speech Language Pathology Discharge Summary  Patient Details  Name: Arlin Sass MRN: [id number] Date of Birth: [birth year]  Patient has met 3 of 3 long term goals.  Patient to discharge at overall Supervision level.   Reasons goals not met: N/A   Clinical Impression/Discharge Summary:  Patient has made functional gains and has met 3 of 3 LTG's this reporting period. Currently, patient demonstrates behaviors consistent with a Rancho Level VIII and is Mod I-Supervision to complete functional tasks safely in regards to recall, problem solving and awareness. Patient and family education is complete and patient will discharge home with 24 hour intermittent supervision from family. Patient would benefit from f/u SLP services to maximize cognitive function and overall functional independence in order to reduce caregiver burden.   Care Partner:     Type of Caregiver Assistance: Cognitive  Recommendation:  Outpatient SLP;24 hour supervision/assistance  Rationale for SLP Follow Up: Maximize cognitive function and independence   Equipment: N/A   Reasons for discharge: Treatment goals met;Discharged from hospital   Patient/Family Agrees with Progress Made and Goals Achieved: Yes       [place], [place] 01/29/2016, 7:00 AM

## 2016-01-29 NOTE — Discharge Instructions (Signed)
Inpatient Rehab Discharge Instructions  Patrick [name] Discharge date and time: No discharge date for patient encounter.   Activities/Precautions/ Functional Status: Activity: activity as tolerated Diet: liquid diet Wound Care: none needed Functional status:  ___ No restrictions     ___ Walk up steps independently ___ 24/7 supervision/assistance   ___ Walk up steps with assistance ___ Intermittent supervision/assistance  ___ Bathe/dress independently ___ Walk with walker     _x__ Bathe/dress with assistance ___ Walk Independently    ___ Shower independently ___ Walk with assistance    ___ Shower with assistance ___ No alcohol     ___ Return to work/school ________    COMMUNITY REFERRALS UPON DISCHARGE:    Outpatient:   Speech Therapy                Agency: [facility] Phone: [phone]              Appointment Date/Time: 10/26 @ 9:30 am (please arrive at 9:00 am)                       ( IF YOU NEED TO CHANGE THIS APPOINTMENT, [place] TO DO SO.)   GENERAL COMMUNITY RESOURCES FOR PATIENT/FAMILY:  Support Groups: [place] [facility] (flyer)      Special Instructions: NO DRIVING OR ALCOHOL  Do not take aspirin until follow-up with Dr. Newman Pies neurosurgery   My questions have been answered and I understand these instructions. I will adhere to these goals and the provided educational materials after my discharge from the hospital.  Patient/Caregiver Signature _______________________________ Date __________  Clinician Signature _______________________________________ Date __________  Please bring this form and your medication list with you to all your follow-up doctor's appointments.

## 2016-01-29 NOTE — Progress Notes (Signed)
Social Work  Discharge Note  The overall goal for the admission was met for:   Discharge location: Yes - home with wife and step-daughter who can provide intermittent support.  Length of Stay: Yes - 7 days  Discharge activity level: Yes - supervision/ mod independent  Home/community participation: Yes  Services provided included: MD, RD, PT, OT, SLP, RN, TR, Pharmacy, Neuropsych and SW  Financial Services: Private Insurance: [employer]  Follow-up services arranged: Outpatient: ST via [facility]  and Patient/Family has no preference for HH/DME agencies  Comments (or additional information): provided information on the local TBI support group as well as mental health resources (including SA resources)  Patient/Family verbalized understanding of follow-up arrangements: Yes  Individual responsible for coordination of the follow-up plan: pt  Confirmed correct DME delivered: NA - no needs  Cortnee Steinmiller, LCSW

## 2016-01-29 NOTE — Discharge Summary (Signed)
Physician Discharge Summary  Patient ID: Patrick [name] MRN: [id number] DOB/AGE: 61-Apr-1957 61 y.o.  Admit date: 01/22/2016 Discharge date: 01/29/2016  Discharge Diagnoses:  Principal Problem:   Diffuse TBI w loss of consciousness of unsp duration, init (HCC) Active Problems:   Fracture of skull base (HCC)   Cranial nerve VII palsy   Vascular headache   Trauma   Generalized pain   Urinary retention   Abnormal urinalysis   Lymphocytosis   Discharged Condition: stable   Labs:  Basic Metabolic Panel: BMP Latest Ref Rng & Units 01/23/2016 01/22/2016 01/16/2016  Glucose 65 - 99 mg/dL 124(H) 151(H) 126(H)  BUN 6 - 20 mg/dL 15 12 10   Creatinine 0.61 - 1.24 mg/dL 0.87 0.87 0.72  Sodium 135 - 145 mmol/L 134(L) 136 133(L)  Potassium 3.5 - 5.1 mmol/L 3.9 4.0 4.5  Chloride 101 - 111 mmol/L 96(L) 95(L) 94(L)  CO2 22 - 32 mmol/L 27 29 29   Calcium 8.9 - 10.3 mg/dL 9.7 10.0 9.5    CBC: CBC Latest Ref Rng & Units 01/23/2016 01/22/2016 01/14/2016  WBC 4.0 - 10.5 K/uL 11.5(H) 17.1(H) 14.5(H)  Hemoglobin 13.0 - 17.0 g/dL 13.3 13.4 14.2  Hematocrit 39.0 - 52.0 % 40.2 40.3 41.8  Platelets 150 - 400 K/uL 395 367 320     CBG: No results for input(s): GLUCAP in the last 168 hours.  Brief HPI:   Patrick [name] a 60 y.o.malewho fell down 7 stairs and landed on his face on 01/05/16. He was evaluated in ED and noted to be intoxicated with amnesia of events and ETOH level 285. CT head done showing multiple facial fractures, basilar skull fracture and acute SAH/IPH along anterior frontal region. CT cervical spine revealed minimally displaced right occipital condyle fracture and nondisplaced left occipital condyle fracture and he is to continue aspen collar for support. He underwent maxillomandibular fixation by Dr. Marla Roe on 10/2 and to continue on liquid diet.  He developed severe HA as well as clear drainage from nares due to CSF leak and was taken to OR 10/9 for placement of  lumbar drain by Dr. Arnoldo Morale.  Lumbar drain removed 10/16 and patient noted to have  improvement in mentation.   Therapy ongoing and CIR recommended due to cognitive deficits, impulsivity, poor safety due to TBI.    Hospital Course: Patrick [name] was admitted to rehab 01/22/2016 for inpatient therapies to consist of PT, ST and OT at least three hours five days a week.  Past admission physiatrist, therapy team and rehab RN have worked together to provide customized collaborative inpatient rehab.  Topamax was added to help manage HA. Blood pressures have improved with improvement in pain control. He was maintained on urecholine and Flomax during his stay and has been voiding without difficulty. Reactive leucocytosis is resolving and mild hyponatremia is stable. Urine culture showed insignificant growth.  He was weaned off Duragesic patch by discharge and continues on hydrocodone prn for pain. He continues to have left facial weakness with decrease in lid closure. He continues on liquid diet with good po intake. Mentation has improved with decrease in perseveration. He has progressed to modified independent to intermittent supervision. His wires broke and he was evaluated by PRS.  Rubber bands placed and pt to follow up in 3 weeks for hardware removal.   Rehab course: During patient's stay in rehab weekly team conferences were held to monitor patient's progress, set goals and discuss barriers to discharge. At admission, patient required min assist for mobility  and min assist for basic self care tasks. He  has had improvement in activity tolerance, balance, postural control, as well as ability to compensate for deficits.  He is able to complete ADL tasks at modified independent level. He is modified independent for transfers and is able to ambulate 150' without AD. He requires supervision with stairs. BERG balance score is 56/56.  He is able to complete functional tasks at modified independent to  supervision for problem solving and awareness.  Family education was completed regarding all aspects of care as well as need for intermittent supervision after discharge.     Disposition: 01-Home or Self Care  Diet: Liquid    Special Instructions: 1. No alcohol or driving. 2. NO aspirin till follow up with NS.    Discharge Instructions    Ambulatory referral to Physical Medicine Rehab    Complete by:  As directed    Moderate complexity follow up 1-2 weeks TBI       Medication List    STOP taking these medications   aspirin 325 MG tablet   EQL COQ10 300 MG Caps Generic drug:  Coenzyme Q10   rosuvastatin 20 MG tablet Commonly known as:  CRESTOR     TAKE these medications   bethanechol 25 MG tablet Commonly known as:  URECHOLINE Take 1 tablet (25 mg total) by mouth 4 (four) times daily.   HYDROcodone-acetaminophen 7.5-325 mg/15 ml solution--Rx 120 ml Commonly known as:  HYCET Take 10-20 mLs by mouth every 4 (four) hours as needed for moderate pain.   methocarbamol 500 MG tablet Commonly known as:  ROBAXIN Take 1 tablet (500 mg total) by mouth every 6 (six) hours as needed for muscle spasms.   multivitamin Liqd Take 15 mLs by mouth daily.   tamsulosin 0.4 MG Caps capsule Commonly known as:  FLOMAX Take 1 capsule (0.4 mg total) by mouth daily. What changed:  when to take this   topiramate 50 MG tablet Commonly known as:  TOPAMAX Take 1 tablet (50 mg total) by mouth daily after supper.      Follow-up Information    Yamira Papa Lorie Phenix, MD .   Specialty:  Physical Medicine and Rehabilitation Why:  office to call for appointment Contact information: [address] [place] [address] [phone]        Ophelia Charter, MD .   Specialty:  Neurosurgery Why:  call for appointment Contact information: [address] [place] [address] [phone]        Wallace Going, DO .   Specialty:  Plastic Surgery Why:   call for appointment Contact information: [place] [address] [id number]           Signed: Bary Leriche 01/29/2016, 9:43 PM

## 2016-02-07 ENCOUNTER — Telehealth: Payer: Self-pay

## 2016-02-07 NOTE — Telephone Encounter (Signed)
Patient called wanting a refill on pain medication Hydrocodone. He recently had a prescription filled on 01/29/2016 for 120 mLs by mouth every four hours as needed for moderate pain. He does have an upcoming appointment with Dr. Posey Pronto for 11/09. Please advise.

## 2016-02-07 NOTE — Telephone Encounter (Signed)
We may prescribe 428ml Hydrocodone to bridge him until his next appointment.  Thank you.

## 2016-02-08 MED ORDER — HYDROCODONE-ACETAMINOPHEN 7.5-325 MG/15ML PO SOLN: 10.0000 mL | ORAL | 0 refills | Status: DC | PRN

## 2016-02-08 NOTE — Telephone Encounter (Signed)
Patrick [name] notified.  His wife Patrick [name] or daughter Patrick [name] will pick up RX.  I instructed him to tell them to be sure to have photo ID license with them.

## 2016-02-13 ENCOUNTER — Telehealth: Payer: Self-pay | Admitting: Physical Medicine & Rehabilitation

## 2016-02-13 NOTE — Telephone Encounter (Signed)
Patient is calling to request a refill on his pain medication.  Please call patient at [phone].

## 2016-02-13 NOTE — Telephone Encounter (Signed)
I spoke with Mr Patrick [name] and explained that he has an appt with Dr Posey Pronto tomorrow and the medication refill was supposed to last until the appt.  (I cautioned him with last refill that it needs to last and he should try to start slowing down on the hourly dosing intervals) He needs to make what he has left last until tomorrow.

## 2016-02-14 ENCOUNTER — Encounter: Payer: Self-pay | Admitting: Physical Medicine & Rehabilitation

## 2016-02-14 ENCOUNTER — Encounter
Payer: Managed Care, Other (non HMO) | Attending: Physical Medicine & Rehabilitation | Admitting: Physical Medicine & Rehabilitation

## 2016-02-14 ENCOUNTER — Encounter (HOSPITAL_BASED_OUTPATIENT_CLINIC_OR_DEPARTMENT_OTHER): Payer: Self-pay | Admitting: *Deleted

## 2016-02-14 VITALS — BP 131/89 | HR 77 | Resp 14

## 2016-02-14 DIAGNOSIS — G510 Bell's palsy: Secondary | ICD-10-CM

## 2016-02-14 DIAGNOSIS — Z8673 Personal history of transient ischemic attack (TIA), and cerebral infarction without residual deficits: Secondary | ICD-10-CM | POA: Insufficient documentation

## 2016-02-14 DIAGNOSIS — I2510 Atherosclerotic heart disease of native coronary artery without angina pectoris: Secondary | ICD-10-CM | POA: Insufficient documentation

## 2016-02-14 DIAGNOSIS — G4459 Other complicated headache syndrome: Secondary | ICD-10-CM | POA: Diagnosis not present

## 2016-02-14 DIAGNOSIS — E785 Hyperlipidemia, unspecified: Secondary | ICD-10-CM | POA: Insufficient documentation

## 2016-02-14 DIAGNOSIS — G4489 Other headache syndrome: Secondary | ICD-10-CM | POA: Insufficient documentation

## 2016-02-14 DIAGNOSIS — S069X0A Unspecified intracranial injury without loss of consciousness, initial encounter: Secondary | ICD-10-CM | POA: Diagnosis not present

## 2016-02-14 DIAGNOSIS — F068 Other specified mental disorders due to known physiological condition: Secondary | ICD-10-CM | POA: Diagnosis not present

## 2016-02-14 DIAGNOSIS — N189 Chronic kidney disease, unspecified: Secondary | ICD-10-CM | POA: Diagnosis present

## 2016-02-14 DIAGNOSIS — Z87891 Personal history of nicotine dependence: Secondary | ICD-10-CM | POA: Insufficient documentation

## 2016-02-14 DIAGNOSIS — T1490XA Injury, unspecified, initial encounter: Secondary | ICD-10-CM | POA: Diagnosis not present

## 2016-02-14 DIAGNOSIS — Z9889 Other specified postprocedural states: Secondary | ICD-10-CM | POA: Insufficient documentation

## 2016-02-14 DIAGNOSIS — N319 Neuromuscular dysfunction of bladder, unspecified: Secondary | ICD-10-CM

## 2016-02-14 DIAGNOSIS — R4189 Other symptoms and signs involving cognitive functions and awareness: Secondary | ICD-10-CM

## 2016-02-14 DIAGNOSIS — W109XXA Fall (on) (from) unspecified stairs and steps, initial encounter: Secondary | ICD-10-CM | POA: Diagnosis not present

## 2016-02-14 DIAGNOSIS — R200 Anesthesia of skin: Secondary | ICD-10-CM | POA: Diagnosis not present

## 2016-02-14 DIAGNOSIS — R42 Dizziness and giddiness: Secondary | ICD-10-CM | POA: Insufficient documentation

## 2016-02-14 DIAGNOSIS — S069X0S Unspecified intracranial injury without loss of consciousness, sequela: Secondary | ICD-10-CM

## 2016-02-14 DIAGNOSIS — G519 Disorder of facial nerve, unspecified: Secondary | ICD-10-CM

## 2016-02-14 DIAGNOSIS — G8918 Other acute postprocedural pain: Secondary | ICD-10-CM

## 2016-02-14 DIAGNOSIS — S02609A Fracture of mandible, unspecified, initial encounter for closed fracture: Secondary | ICD-10-CM | POA: Insufficient documentation

## 2016-02-14 MED ORDER — HYDROCODONE-ACETAMINOPHEN 7.5-325 MG/15ML PO SOLN: 10.0000 mL | ORAL | 0 refills | Status: DC | PRN

## 2016-02-14 NOTE — Progress Notes (Signed)
Subjective:    Patient ID: Patrick [name], male    DOB: 1955-12-11, 60 y.o.   MRN: [id number]  HPI 60 y.o. male with pmh of TIA, CKD who fell down 7 stairs and landed on his face on 01/05/16 presents for hospital follow up after receiving CIR for polytrauma/TBI  At discharge he was instructed to abstain from [place], which he has. He was cleared by Neurosurg to drive and go to work.  His C-collar was d/ced.  He seen PRS next week.  He complains of pain in his jaw mainly.  He continues to take hydrocodone.  He also notes headache. He continues to have memory deficits.  He is still in therapy.  He does not have issues with math at work.  Denies headache with increased cognitive activity.  Bowel movements have improved.  Urinary symptoms have resolved.  Overall, pt doing well, but is limited by pain.    Pain Inventory Average Pain 8 Pain Right Now 8 My pain is sharp  In the last 24 hours, has pain interfered with the following? General activity 7 Relation with others 7 Enjoyment of life 7 What TIME of day is your pain at its worst? all Sleep (in general) Poor  Pain is worse with: unsure Pain improves with: medication Relief from Meds: ?  Mobility walk without assistance ability to climb steps?  yes do you drive?  yes  Function employed # of hrs/week 30-40  Neuro/Psych numbness dizziness  Prior Studies hospital f/u  Physicians involved in your care hospital f/u   Family History  Problem Relation Age of Onset  . Cancer Mother   . Heart disease Father    Social History   Social History  . Marital status: Married    Spouse name: N/A  . Number of children: N/A  . Years of education: N/A   Social History Main Topics  . Smoking status: Former Smoker    Types: Cigarettes    Quit date: 01/06/1995  . Smokeless tobacco: Never Used  . Alcohol use Yes  . Drug use: No  . Sexual activity: Not Asked   Other Topics Concern  . None   Social History Narrative  . None     Past Surgical History:  Procedure Laterality Date  . ORIF MANDIBULAR FRACTURE N/A 01/07/2016   Procedure: OPEN REDUCTION INTERNAL FIXATION (ORIF) MANDIBULAR FRACTURE;  Surgeon: Loel Lofty Dillingham, DO;  Location: [facility];  Service: Plastics;  Laterality: N/A;  . PLACEMENT OF LUMBAR DRAIN N/A 01/14/2016   Procedure: PLACEMENT OF LUMBAR DRAIN;  Surgeon: Newman Pies, MD;  Location: [place];  Service: Neurosurgery;  Laterality: N/A;   Past Medical History:  Diagnosis Date  . Coronary artery disease    moderate  . Hyperlipidemia   . TIA (transient ischemic attack)    BP 131/89   Pulse 77   Resp 14   SpO2 98%   Opioid Risk Score:   Fall Risk Score:  `1  Depression screen PHQ 2/9  No flowsheet data found.  Review of Systems  Constitutional: Positive for unexpected weight change.  HENT: Negative.   Eyes: Negative.   Respiratory: Negative.   Cardiovascular: Negative.   Gastrointestinal: Negative.   Endocrine: Negative.   Genitourinary: Negative.   Musculoskeletal: Positive for back pain, myalgias, neck pain and neck stiffness.  Skin: Negative.   Neurological: Positive for dizziness and numbness.       Tingling   Hematological: Negative.   Psychiatric/Behavioral: Negative.  Objective:   Physical Exam Constitutional:  He appears well-developed and well-nourished. NAD. HENT: Normocephalic. Resolving wounds.  Eyes: Conjunctivae and EOM are normal.  Neck: Neck immobilized by collar  Respiratory: Effort normal and breath sounds normal. No respiratory distress. He has no wheezes.  GI: Soft. Bowel sounds are normal. He exhibits no distension. There is no tenderness.  Musculoskeletal: He exhibits no edema or tenderness.  Neurological: He is alert and oriented. Left facial weakness and decrease left lid closure/brow movement (stable).  Decreased hearing left ear.  UE strength grossly 5/5 with some pain inhibition proximally.  LE: 5/5 hf and ke and 5/5 adf/pf.  Skin: Skin  is warm and dry. No erythema.  Psychiatric: Mood, affect, behavior normal.    Assessment & Plan:  59 y.o. male with pmh of TIA, CKD who fell down 7 stairs and landed on his face on 01/05/16 presents for hospital follow up after receiving CIR for polytrauma/TBI    1.  TBI/polytrauma after fall down stairs with CN VII injury  Persistent memory deficits, however, improved overall function and has returned to work  Does not appears to have post-concussive syndrome  Cont SLP  2. Post-op pain with headache  Wife feels pt is getting addicted to pain meds  Will order hydrocodone 7.5mg  q4 PRN daily PRN, one time, pt to wean after surgery.  Cont Topamax, will consider weaning in future  3. Neurogenic bowel/bladder appear to have resolved  Will wean bethanechol  Cont Flomax  4. Facial fractures/TMJ fracture s/p maxillomandibular fixation: Liquid diet.             No nose blowing.   With left CN VII injury  Pt to follow up with PRS eval for hardware removal.    Pt has been noncompliant with rubber bands

## 2016-02-17 ENCOUNTER — Ambulatory Visit: Payer: Self-pay | Admitting: Plastic Surgery

## 2016-02-17 DIAGNOSIS — S02609D Fracture of mandible, unspecified, subsequent encounter for fracture with routine healing: Secondary | ICD-10-CM

## 2016-02-21 ENCOUNTER — Ambulatory Visit (HOSPITAL_BASED_OUTPATIENT_CLINIC_OR_DEPARTMENT_OTHER)
Admission: RE | Admit: 2016-02-21 | Discharge: 2016-02-21 | Disposition: A | Discharge status: Home / Self Care | Payer: Managed Care, Other (non HMO) | Source: Ambulatory Visit | Attending: Plastic Surgery | Admitting: Plastic Surgery

## 2016-02-21 ENCOUNTER — Ambulatory Visit (HOSPITAL_BASED_OUTPATIENT_CLINIC_OR_DEPARTMENT_OTHER): Payer: Managed Care, Other (non HMO) | Admitting: Anesthesiology

## 2016-02-21 ENCOUNTER — Encounter (HOSPITAL_BASED_OUTPATIENT_CLINIC_OR_DEPARTMENT_OTHER)
Admission: RE | Disposition: A | Discharge status: Home / Self Care | Payer: Self-pay | Source: Ambulatory Visit | Attending: Plastic Surgery

## 2016-02-21 ENCOUNTER — Encounter (HOSPITAL_BASED_OUTPATIENT_CLINIC_OR_DEPARTMENT_OTHER): Payer: Self-pay | Admitting: Anesthesiology

## 2016-02-21 DIAGNOSIS — Z472 Encounter for removal of internal fixation device: Secondary | ICD-10-CM | POA: Diagnosis present

## 2016-02-21 DIAGNOSIS — Z79899 Other long term (current) drug therapy: Secondary | ICD-10-CM | POA: Diagnosis not present

## 2016-02-21 DIAGNOSIS — Z8673 Personal history of transient ischemic attack (TIA), and cerebral infarction without residual deficits: Secondary | ICD-10-CM | POA: Diagnosis not present

## 2016-02-21 DIAGNOSIS — I2510 Atherosclerotic heart disease of native coronary artery without angina pectoris: Secondary | ICD-10-CM | POA: Insufficient documentation

## 2016-02-21 DIAGNOSIS — Z87891 Personal history of nicotine dependence: Secondary | ICD-10-CM | POA: Diagnosis not present

## 2016-02-21 DIAGNOSIS — S02609D Fracture of mandible, unspecified, subsequent encounter for fracture with routine healing: Secondary | ICD-10-CM

## 2016-02-21 HISTORY — PX: MANDIBULAR HARDWARE REMOVAL: SHX5205

## 2016-02-21 SURGERY — REMOVAL, HARDWARE, MANDIBLE
Anesthesia: General | Site: Mouth

## 2016-02-21 MED ORDER — MEPERIDINE HCL 25 MG/ML IJ SOLN: 6.2500 mg | INTRAMUSCULAR | Status: DC | PRN

## 2016-02-21 MED ORDER — PROPOFOL 10 MG/ML IV BOLUS
INTRAVENOUS | Status: DC | PRN
  Administered 2016-02-21: 50 mg via INTRAVENOUS
  Administered 2016-02-21: 100 mg via INTRAVENOUS

## 2016-02-21 MED ORDER — METOCLOPRAMIDE HCL 5 MG/ML IJ SOLN: 10.0000 mg | Freq: Once | INTRAMUSCULAR | Status: DC | PRN

## 2016-02-21 MED ORDER — DEXAMETHASONE SODIUM PHOSPHATE 4 MG/ML IJ SOLN
INTRAMUSCULAR | Status: DC | PRN
  Administered 2016-02-21: 10 mg via INTRAVENOUS

## 2016-02-21 MED ORDER — MIDAZOLAM HCL 2 MG/2ML IJ SOLN
INTRAMUSCULAR | Status: AC
  Filled 2016-02-21: qty 2

## 2016-02-21 MED ORDER — CEFAZOLIN SODIUM-DEXTROSE 2-4 GM/100ML-% IV SOLN
INTRAVENOUS | Status: AC
  Filled 2016-02-21: qty 100

## 2016-02-21 MED ORDER — FENTANYL CITRATE (PF) 100 MCG/2ML IJ SOLN
INTRAMUSCULAR | Status: AC
  Filled 2016-02-21: qty 2

## 2016-02-21 MED ORDER — FENTANYL CITRATE (PF) 100 MCG/2ML IJ SOLN
50.0000 ug | INTRAMUSCULAR | Status: DC | PRN
  Administered 2016-02-21: 50 ug via INTRAVENOUS
  Administered 2016-02-21: 100 ug via INTRAVENOUS

## 2016-02-21 MED ORDER — SCOPOLAMINE 1 MG/3DAYS TD PT72: 1.0000 | MEDICATED_PATCH | Freq: Once | TRANSDERMAL | Status: DC | PRN

## 2016-02-21 MED ORDER — BACITRACIN-NEOMYCIN-POLYMYXIN 400-5-5000 EX OINT
TOPICAL_OINTMENT | CUTANEOUS | Status: DC | PRN
  Administered 2016-02-21: 1 via TOPICAL

## 2016-02-21 MED ORDER — CEFAZOLIN SODIUM-DEXTROSE 2-4 GM/100ML-% IV SOLN
2.0000 g | INTRAVENOUS | Status: AC
  Administered 2016-02-21: 2 g via INTRAVENOUS

## 2016-02-21 MED ORDER — LACTATED RINGERS IV SOLN: INTRAVENOUS | Status: DC

## 2016-02-21 MED ORDER — MIDAZOLAM HCL 2 MG/2ML IJ SOLN
1.0000 mg | INTRAMUSCULAR | Status: DC | PRN
  Administered 2016-02-21: 2 mg via INTRAVENOUS

## 2016-02-21 MED ORDER — ONDANSETRON HCL 4 MG/2ML IJ SOLN
INTRAMUSCULAR | Status: DC | PRN
  Administered 2016-02-21: 4 mg via INTRAVENOUS

## 2016-02-21 MED ORDER — DIPHENHYDRAMINE HCL 50 MG/ML IJ SOLN
INTRAMUSCULAR | Status: DC | PRN
  Administered 2016-02-21: 25 mg via INTRAVENOUS

## 2016-02-21 MED ORDER — LIDOCAINE HCL (CARDIAC) 20 MG/ML IV SOLN
INTRAVENOUS | Status: DC | PRN
  Administered 2016-02-21: 50 mg via INTRAVENOUS

## 2016-02-21 MED ORDER — LIDOCAINE-EPINEPHRINE 1 %-1:100000 IJ SOLN
INTRAMUSCULAR | Status: DC | PRN
  Administered 2016-02-21: 2 mL

## 2016-02-21 MED ORDER — FENTANYL CITRATE (PF) 100 MCG/2ML IJ SOLN
25.0000 ug | INTRAMUSCULAR | Status: DC | PRN
  Administered 2016-02-21: 50 ug via INTRAVENOUS
  Administered 2016-02-21: 25 ug via INTRAVENOUS

## 2016-02-21 MED ORDER — LACTATED RINGERS IV SOLN
INTRAVENOUS | Status: DC
  Administered 2016-02-21: 13:00:00 via INTRAVENOUS

## 2016-02-21 SURGICAL SUPPLY — 29 items
BLADE SURG 15 STRL LF DISP TIS (BLADE) ×1 IMPLANT
BLADE SURG 15 STRL SS (BLADE) ×1
CANISTER SUCT 1200ML W/VALVE (MISCELLANEOUS) ×2 IMPLANT
COVER MAYO STAND STRL (DRAPES) IMPLANT
DECANTER SPIKE VIAL GLASS SM (MISCELLANEOUS) ×2 IMPLANT
DERMABOND ADVANCED (GAUZE/BANDAGES/DRESSINGS)
DERMABOND ADVANCED .7 DNX12 (GAUZE/BANDAGES/DRESSINGS) IMPLANT
ELECT COATED BLADE 2.86 ST (ELECTRODE) IMPLANT
ELECT REM PT RETURN 9FT ADLT (ELECTROSURGICAL) ×2
ELECTRODE REM PT RTRN 9FT ADLT (ELECTROSURGICAL) ×1 IMPLANT
GLOVE BIO SURGEON STRL SZ 6.5 (GLOVE) ×4 IMPLANT
GLOVE BIOGEL PI IND STRL 6.5 (GLOVE) ×1 IMPLANT
GLOVE BIOGEL PI INDICATOR 6.5 (GLOVE) ×1
GOWN STRL REUS W/ TWL LRG LVL3 (GOWN DISPOSABLE) ×2 IMPLANT
GOWN STRL REUS W/TWL LRG LVL3 (GOWN DISPOSABLE) ×2
MARKER SKIN DUAL TIP RULER LAB (MISCELLANEOUS) IMPLANT
NEEDLE PRECISIONGLIDE 27X1.5 (NEEDLE) ×2 IMPLANT
PACK BASIN DAY SURGERY FS (CUSTOM PROCEDURE TRAY) ×2 IMPLANT
PENCIL FOOT CONTROL (ELECTRODE) IMPLANT
SCISSORS WIRE ANG 4 3/4 DISP (INSTRUMENTS) IMPLANT
SHEET MEDIUM DRAPE 40X70 STRL (DRAPES) ×2 IMPLANT
SPONGE GAUZE 4X4 12PLY STER LF (GAUZE/BANDAGES/DRESSINGS) ×4 IMPLANT
SUT CHROMIC 3 0 PS 2 (SUTURE) IMPLANT
SUT CHROMIC 4 0 PS 2 18 (SUTURE) IMPLANT
SYR CONTROL 10ML LL (SYRINGE) ×2 IMPLANT
TOWEL OR 17X24 6PK STRL BLUE (TOWEL DISPOSABLE) ×2 IMPLANT
TRAY DSU PREP LF (CUSTOM PROCEDURE TRAY) IMPLANT
TUBE CONNECTING 20X1/4 (TUBING) ×2 IMPLANT
YANKAUER SUCT BULB TIP NO VENT (SUCTIONS) ×2 IMPLANT

## 2016-02-21 NOTE — Transfer of Care (Signed)
Immediate Anesthesia Transfer of Care Note  Patient: Patrick [name]  Procedure(s) Performed: Procedure(s): MANDIBULAR HARDWARE REMOVAL (N/A)  Patient Location: PACU  Anesthesia Type:General  Level of Consciousness: sedated  Airway & Oxygen Therapy: Patient Spontanous Breathing and Patient connected to face mask oxygen  Post-op Assessment: Report given to RN and Post -op Vital signs reviewed and stable  Post vital signs: Reviewed and stable  Last Vitals:  Vitals:   02/21/16 1220 02/21/16 1343  BP: 134/87 (!) 148/88  Pulse: 69 95  Resp: 16 (!) 9  Temp: 36.7 C     Last Pain:  Vitals:   02/21/16 1220  TempSrc: Oral  PainSc: 7       Patients Stated Pain Goal: 3 ([id number])  Complications: No apparent anesthesia complications

## 2016-02-21 NOTE — Anesthesia Procedure Notes (Signed)
Procedure Name: LMA Insertion Date/Time: 02/21/2016 1:13 PM Performed by: Melynda Ripple D Pre-anesthesia Checklist: Patient identified, Emergency Drugs available, Suction available and Patient being monitored Patient Re-evaluated:Patient Re-evaluated prior to inductionOxygen Delivery Method: Circle system utilized Preoxygenation: Pre-oxygenation with 100% oxygen Intubation Type: IV induction Ventilation: Mask ventilation without difficulty LMA: LMA inserted LMA Size: 4.0 Number of attempts: 1 Airway Equipment and Method: Bite block Placement Confirmation: positive ETCO2 Tube secured with: Tape Dental Injury: Teeth and Oropharynx as per pre-operative assessment

## 2016-02-21 NOTE — Op Note (Addendum)
Operative Note   DATE OF OPERATION: 02/21/2016  LOCATION: [facility]  SURGICAL DIVISION: Plastic Surgery  PREOPERATIVE DIAGNOSES:  Hardware from Maxilla and mandible from treatment of a TMJ fracture.  POSTOPERATIVE DIAGNOSES:  same  PROCEDURE:  Removal of maxillomandibular hardware  SURGEON: Theodoro Kos, DO  ANESTHESIA:  General.   COMPLICATIONS: None.   INDICATIONS FOR PROCEDURE:  The patient, Patrick [name] is a 60 y.o. male born on [birth year], is here for treatment of a skull base fracture involving the sphenoid bones, clivus and temporomandibular joints. MRN: [id number]  CONSENT:  Informed consent was obtained directly from the patient. Risks, benefits and alternatives were fully discussed. Specific risks including but not limited to bleeding, infection, hematoma, seroma, scarring, pain, infection, asymmetry, wound healing problems, and need for further surgery were all discussed. The patient did have an ample opportunity to have questions answered to satisfaction.   DESCRIPTION OF PROCEDURE:  The patient was taken to the operating room. SCDs were placed. The patient's operative site was prepped and draped in a sterile fashion. A time out was performed and all information was confirmed to be correct.  Anesthesia was administered.  The maxillomandibular fixation hardware was removed completely.  There was good alignment of the dentition.  The patient tolerated the procedure well.  There were no complications. The patient was allowed to wake from anesthesia and taken to the recovery room in satisfactory condition.

## 2016-02-21 NOTE — Discharge Instructions (Signed)
Soft diet only  No chewing Keep teeth clean - good oral hygiene     Post Anesthesia Home Care Instructions  Activity: Get plenty of rest for the remainder of the day. A responsible adult should stay with you for 24 hours following the procedure.  For the next 24 hours, DO NOT: -Drive a car -Paediatric nurse -Drink alcoholic beverages -Take any medication unless instructed by your physician -Make any legal decisions or sign important papers.  Meals: Start with liquid foods such as gelatin or soup. Progress to regular foods as tolerated. Avoid greasy, spicy, heavy foods. If nausea and/or vomiting occur, drink only clear liquids until the nausea and/or vomiting subsides. Call your physician if vomiting continues.  Special Instructions/Symptoms: Your throat may feel dry or sore from the anesthesia or the breathing tube placed in your throat during surgery. If this causes discomfort, gargle with warm salt water. The discomfort should disappear within 24 hours.  If you had a scopolamine patch placed behind your ear for the management of post- operative nausea and/or vomiting:  1. The medication in the patch is effective for 72 hours, after which it should be removed.  Wrap patch in a tissue and discard in the trash. Wash hands thoroughly with soap and water. 2. You may remove the patch earlier than 72 hours if you experience unpleasant side effects which may include dry mouth, dizziness or visual disturbances. 3. Avoid touching the patch. Wash your hands with soap and water after contact with the patch.

## 2016-02-21 NOTE — Brief Op Note (Addendum)
02/21/2016  1:13 PM  PATIENT:  Patrick [name]  60 y.o. male  PRE-OPERATIVE DIAGNOSIS:  MAXILLARY FRACTURE  POST-OPERATIVE DIAGNOSIS:  same  PROCEDURE:  Procedure(s): MANDIBULAR HARDWARE REMOVAL (N/A)  SURGEON:  Surgeon(s) and Role:    * Spurgeon Gancarz S Braylon Lemmons, DO - Primary  PHYSICIAN ASSISTANT: none  ASSISTANTS: none   ANESTHESIA:   General  EBL:  No intake/output data recorded.  BLOOD ADMINISTERED:none  DRAINS: none   LOCAL MEDICATIONS USED:  NONE  SPECIMEN:  No Specimen  DISPOSITION OF SPECIMEN:  N/A  COUNTS:  YES  TOURNIQUET:  * No tourniquets in log *  DICTATION: .Dragon Dictation  PLAN OF CARE: Discharge to home after PACU  PATIENT DISPOSITION:  PACU - hemodynamically stable.   Delay start of Pharmacological VTE agent (>24hrs) due to surgical blood loss or risk of bleeding: no

## 2016-02-21 NOTE — Anesthesia Postprocedure Evaluation (Signed)
Anesthesia Post Note  Patient: Patrick [name]  Procedure(s) Performed: Procedure(s) (LRB): MANDIBULAR HARDWARE REMOVAL (N/A)  Patient location during evaluation: PACU Anesthesia Type: General Level of consciousness: awake and alert Pain management: pain level controlled Vital Signs Assessment: post-procedure vital signs reviewed and stable Respiratory status: spontaneous breathing, nonlabored ventilation, respiratory function stable and patient connected to nasal cannula oxygen Cardiovascular status: blood pressure returned to baseline and stable Postop Assessment: no signs of nausea or vomiting Anesthetic complications: no    Last Vitals:  Vitals:   02/21/16 1430 02/21/16 1515  BP: (!) 147/87 (!) 149/91  Pulse: 67 69  Resp: 14 18  Temp:  36.8 C    Last Pain:  Vitals:   02/21/16 1515  TempSrc:   PainSc: 2                  Effie Berkshire

## 2016-02-21 NOTE — Anesthesia Preprocedure Evaluation (Signed)
Anesthesia Evaluation  Patient identified by MRN, date of birth, ID band Patient awake    Reviewed: Allergy & Precautions, NPO status , Patient's Chart, lab work & pertinent test results  Airway Mallampati: II  TM Distance: >3 FB Neck ROM: Full  Mouth opening: Limited Mouth Opening  Dental no notable dental hx.    Pulmonary former smoker,    Pulmonary exam normal breath sounds clear to auscultation       Cardiovascular + CAD (medical management)  Normal cardiovascular exam Rhythm:Regular Rate:Normal     Neuro/Psych negative neurological ROS  negative psych ROS   GI/Hepatic negative GI ROS, Neg liver ROS,   Endo/Other  negative endocrine ROS  Renal/GU negative Renal ROS  negative genitourinary   Musculoskeletal negative musculoskeletal ROS (+)   Abdominal   Peds negative pediatric ROS (+)  Hematology negative hematology ROS (+)   Anesthesia Other Findings S/p head trauma from fall with CSF leak and c spine injury.  Reproductive/Obstetrics negative OB ROS                             Anesthesia Physical Anesthesia Plan  ASA: II  Anesthesia Plan: General   Post-op Pain Management:    Induction: Intravenous  Airway Management Planned: LMA  Additional Equipment:   Intra-op Plan:   Post-operative Plan: Extubation in OR  Informed Consent: I have reviewed the patients History and Physical, chart, labs and discussed the procedure including the risks, benefits and alternatives for the proposed anesthesia with the patient or authorized representative who has indicated his/her understanding and acceptance.   Dental advisory given  Plan Discussed with: CRNA  Anesthesia Plan Comments:         Anesthesia Quick Evaluation

## 2016-02-21 NOTE — H&P (Signed)
Patrick [name] is an 60 y.o. male.   Chief Complaint: facial fractures HPI: The patient is a 60 yrs old wm who is here for removal of his hardware.  He fell 6 weeks ago down the stairs and sustained basilar skull fractures.  He has not been compliant with his MMF.  He was seen in the clinic and was not able to give any indication that he would be if the hardware was left in place.  He is not having any mandible pain and he has normal occlusion for his baseline.  Past Medical History:  Diagnosis Date  . Coronary artery disease    moderate  . Hyperlipidemia   . TIA (transient ischemic attack)     Past Surgical History:  Procedure Laterality Date  . ORIF MANDIBULAR FRACTURE N/A 01/07/2016   Procedure: OPEN REDUCTION INTERNAL FIXATION (ORIF) MANDIBULAR FRACTURE;  Surgeon: Loel Lofty Delphina Schum, DO;  Location: [place];  Service: Plastics;  Laterality: N/A;  . PLACEMENT OF LUMBAR DRAIN N/A 01/14/2016   Procedure: PLACEMENT OF LUMBAR DRAIN;  Surgeon: Newman Pies, MD;  Location: [place];  Service: Neurosurgery;  Laterality: N/A;    Family History  Problem Relation Age of Onset  . Cancer Mother   . Heart disease Father    Social History:  reports that he quit smoking about 21 years ago. His smoking use included Cigarettes. He has never used smokeless tobacco. He reports that he drinks alcohol. He reports that he does not use drugs.  Allergies:  Allergies  Allergen Reactions  . Acetaminophen Other (See Comments)    GI upset GI upset    Medications Prior to Admission  Medication Sig Dispense Refill  . HYDROcodone-acetaminophen (HYCET) 7.5-325 mg/15 ml solution Take 10 mLs by mouth every 4 (four) hours as needed for moderate pain. 1800 mL 0  . methocarbamol (ROBAXIN) 500 MG tablet Take 1 tablet (500 mg total) by mouth every 6 (six) hours as needed for muscle spasms. 60 tablet 0  . Multiple Vitamin (MULTIVITAMIN) LIQD Take 15 mLs by mouth daily. 1 Bottle 0  . tamsulosin (FLOMAX) 0.4 MG  CAPS capsule Take 1 capsule (0.4 mg total) by mouth daily. 30 capsule 0  . topiramate (TOPAMAX) 50 MG tablet Take 1 tablet (50 mg total) by mouth daily after supper. 30 tablet 0    No results found for this or any previous visit (from the past 48 hour(s)). No results found.  Review of Systems  Constitutional: Negative.   HENT: Negative.   Respiratory: Negative.   Cardiovascular: Negative.   Gastrointestinal: Negative.   Genitourinary: Negative.   Musculoskeletal: Negative.   Skin: Negative.     Blood pressure 134/87, pulse 69, temperature 98 F (36.7 C), temperature source Oral, resp. rate 16, height 6' (1.829 m), weight 77.1 kg (170 lb), SpO2 100 %. Physical Exam  Constitutional: He is oriented to person, place, and time. He appears well-developed and well-nourished.  HENT:  Head: Normocephalic.  Eyes: EOM are normal.  Cardiovascular: Normal rate.   Respiratory: Effort normal.  GI: Soft. He exhibits no distension.  Neurological: He is alert and oriented to person, place, and time.  Skin: Skin is warm. No rash noted. No erythema. No pallor.  Psychiatric: He has a normal mood and affect.     Assessment/Plan Removal of MMF hardware.  Wallace Going, DO 02/21/2016, 12:45 PM

## 2016-02-22 ENCOUNTER — Encounter (HOSPITAL_BASED_OUTPATIENT_CLINIC_OR_DEPARTMENT_OTHER): Payer: Self-pay | Admitting: Plastic Surgery

## 2016-02-26 ENCOUNTER — Telehealth: Payer: Self-pay | Admitting: *Deleted

## 2016-02-26 MED ORDER — HYDROCODONE-ACETAMINOPHEN 7.5-325 MG/15ML PO SOLN: 10.0000 mL | ORAL | 0 refills | Status: DC | PRN

## 2016-02-26 MED ORDER — HYDROCODONE-ACETAMINOPHEN 7.5-325 MG/15ML PO SOLN
10.0000 mL | ORAL | 0 refills | Status: DC | PRN
Start: 2016-02-26 — End: 2016-02-26

## 2016-02-26 NOTE — Telephone Encounter (Signed)
Yes, that is fine.  Thank you, but please remind patient the plan was to taper the medication, so that he should use less overnight time and will not be requiring a refill after the initial prescription.  Thanks.

## 2016-02-26 NOTE — Telephone Encounter (Signed)
Patrick [name] called and says that his pharmacy did not have enough of the liquid pain medication and only filled with what they had which was 930 ml (confirmed by [place]). Because they filled the Rx 02/14/16 with half the amount it cancelled out the remaining ~900 ml and he will need a new Rx for the remaining amount.  Is it ok to write new Rx for remaining amount? Please advise.

## 2016-02-26 NOTE — Telephone Encounter (Signed)
Patrick [name] was notifed

## 2016-03-19 ENCOUNTER — Encounter
Payer: Managed Care, Other (non HMO) | Attending: Physical Medicine & Rehabilitation | Admitting: Physical Medicine & Rehabilitation

## 2016-03-19 ENCOUNTER — Encounter: Payer: Self-pay | Admitting: Physical Medicine & Rehabilitation

## 2016-03-19 VITALS — BP 154/94 | HR 74 | Temp 98.0°F | Resp 14

## 2016-03-19 DIAGNOSIS — Z87891 Personal history of nicotine dependence: Secondary | ICD-10-CM | POA: Insufficient documentation

## 2016-03-19 DIAGNOSIS — I2510 Atherosclerotic heart disease of native coronary artery without angina pectoris: Secondary | ICD-10-CM | POA: Diagnosis not present

## 2016-03-19 DIAGNOSIS — Z8673 Personal history of transient ischemic attack (TIA), and cerebral infarction without residual deficits: Secondary | ICD-10-CM | POA: Diagnosis present

## 2016-03-19 DIAGNOSIS — S069X0S Unspecified intracranial injury without loss of consciousness, sequela: Secondary | ICD-10-CM

## 2016-03-19 DIAGNOSIS — G510 Bell's palsy: Secondary | ICD-10-CM

## 2016-03-19 DIAGNOSIS — N400 Enlarged prostate without lower urinary tract symptoms: Secondary | ICD-10-CM

## 2016-03-19 DIAGNOSIS — G8918 Other acute postprocedural pain: Secondary | ICD-10-CM | POA: Insufficient documentation

## 2016-03-19 DIAGNOSIS — G519 Disorder of facial nerve, unspecified: Secondary | ICD-10-CM

## 2016-03-19 DIAGNOSIS — E785 Hyperlipidemia, unspecified: Secondary | ICD-10-CM | POA: Insufficient documentation

## 2016-03-19 DIAGNOSIS — G4489 Other headache syndrome: Secondary | ICD-10-CM | POA: Diagnosis not present

## 2016-03-19 DIAGNOSIS — S0292XS Unspecified fracture of facial bones, sequela: Secondary | ICD-10-CM

## 2016-03-19 DIAGNOSIS — S069X0A Unspecified intracranial injury without loss of consciousness, initial encounter: Secondary | ICD-10-CM | POA: Diagnosis present

## 2016-03-19 DIAGNOSIS — S02609A Fracture of mandible, unspecified, initial encounter for closed fracture: Secondary | ICD-10-CM | POA: Diagnosis not present

## 2016-03-19 DIAGNOSIS — R42 Dizziness and giddiness: Secondary | ICD-10-CM | POA: Insufficient documentation

## 2016-03-19 DIAGNOSIS — H9042 Sensorineural hearing loss, unilateral, left ear, with unrestricted hearing on the contralateral side: Secondary | ICD-10-CM

## 2016-03-19 DIAGNOSIS — Z9889 Other specified postprocedural states: Secondary | ICD-10-CM | POA: Insufficient documentation

## 2016-03-19 DIAGNOSIS — N189 Chronic kidney disease, unspecified: Secondary | ICD-10-CM | POA: Insufficient documentation

## 2016-03-19 DIAGNOSIS — T1490XA Injury, unspecified, initial encounter: Secondary | ICD-10-CM | POA: Diagnosis not present

## 2016-03-19 DIAGNOSIS — F068 Other specified mental disorders due to known physiological condition: Secondary | ICD-10-CM

## 2016-03-19 DIAGNOSIS — R200 Anesthesia of skin: Secondary | ICD-10-CM | POA: Diagnosis not present

## 2016-03-19 NOTE — Progress Notes (Signed)
Subjective:    Patient ID: Patrick [name], male    DOB: 14-Apr-1955, 61 y.o.   MRN: [id number]  HPI 60 y.o. male with pmh of TIA, CKD who fell down 7 stairs and landed on his face on 01/05/16 presents for follow up for polytrauma/TBI  Last clinic visit 02/14/16. He is still refraining from [place]. He saw PRS and all of his hardware was removed.  His pain has improved.  It is mainly at night. He has completed therapies.  He no longer has difficulty with math at work.  He stopped taking Topamax. He has stopped bethanechol.  Overall, pt states he is doing better.   Pain Inventory Average Pain 8 Pain Right Now 8 My pain is sharp  In the last 24 hours, has pain interfered with the following? General activity 7 Relation with others 7 Enjoyment of life 7 What TIME of day is your pain at its worst? all Sleep (in general) Poor  Pain is worse with: unsure Pain improves with: medication Relief from Meds: ?  Mobility walk without assistance ability to climb steps?  yes do you drive?  yes  Function employed # of hrs/week 30-40  Neuro/Psych numbness dizziness  Prior Studies hospital f/u  Physicians involved in your care hospital f/u   Family History  Problem Relation Age of Onset  . Cancer Mother   . Heart disease Father    Social History   Social History  . Marital status: Married    Spouse name: N/A  . Number of children: N/A  . Years of education: N/A   Social History Main Topics  . Smoking status: Former Smoker    Types: Cigarettes    Quit date: 01/06/1995  . Smokeless tobacco: Never Used  . Alcohol use Yes  . Drug use: No  . Sexual activity: Not Asked   Other Topics Concern  . None   Social History Narrative  . None   Past Surgical History:  Procedure Laterality Date  . MANDIBULAR HARDWARE REMOVAL N/A 02/21/2016   Procedure: MANDIBULAR HARDWARE REMOVAL;  Surgeon: Wallace Going, DO;  Location: [place];  Service: Plastics;   Laterality: N/A;  . ORIF MANDIBULAR FRACTURE N/A 01/07/2016   Procedure: OPEN REDUCTION INTERNAL FIXATION (ORIF) MANDIBULAR FRACTURE;  Surgeon: Loel Lofty Dillingham, DO;  Location: [place];  Service: Plastics;  Laterality: N/A;  . PLACEMENT OF LUMBAR DRAIN N/A 01/14/2016   Procedure: PLACEMENT OF LUMBAR DRAIN;  Surgeon: Newman Pies, MD;  Location: [place];  Service: Neurosurgery;  Laterality: N/A;   Past Medical History:  Diagnosis Date  . Coronary artery disease    moderate  . Hyperlipidemia   . TIA (transient ischemic attack)    BP (!) 154/94 (BP Location: Left Arm, Patient Position: Sitting, Cuff Size: Large)   Pulse 74   Temp 98 F (36.7 C) (Oral)   Resp 14   SpO2 96%   Opioid Risk Score:   Fall Risk Score:  `1  Depression screen PHQ 2/9  No flowsheet data found.  Review of Systems  HENT: Negative.   Eyes: Negative.   Respiratory: Negative.   Cardiovascular: Negative.   Gastrointestinal: Negative.   Endocrine: Negative.   Genitourinary: Negative.   Musculoskeletal: Positive for back pain, myalgias, neck pain and neck stiffness.  Skin: Negative.   Neurological: Positive for dizziness and numbness.       Tingling   Hematological: Negative.   Psychiatric/Behavioral: Negative.       Objective:  Physical Exam Constitutional:  He appears well-developed and well-nourished. NAD. HENT: Normocephalic. Atraumatic.  Eyes: EOMI. No discharge.   Respiratory: Effort normal and breath sounds normal.  GI: Soft. Bowel sounds are normal.   Musculoskeletal: He exhibits no edema or tenderness.  Neurological: Left facial weakness and decrease left lid closure/brow movement (slightly improved).   Decreased hearing left ear.  UE strength grossly 5/5.  LE: 5/5 hf and ke and 5/5 adf/pf.  Skin: Skin is warm and dry. No erythema.  Psychiatric: Mood, affect, behavior normal.    Assessment & Plan:  60 y.o. male with pmh of TIA, CKD who fell down 7 stairs and landed on his face on  01/05/16 presents for follow up for polytrauma/TBI.  1. TBI/polytrauma after fall down stairs with CN VII injury  Cognitively improving, back at work, still with deficits with memory of passwords at times  Follow Neurosurg  Cont HEP  Cont to have left hearing impairment  2. Post-op pain  Currently managed with Tylenol  Weaned from topomax  3. Neurogenic bowel/bladder appear to have resolved with BPH  Weaned bethanechol  Cont Flomax  4. Facial fractures/TMJ fracture s/p maxillomandibular fixation s/p hardware removal  Cont soft diet, no issues  With left CN VII injury  All questions answered regarding hardware, diet, medications, letter for determining sound cognition etc.

## 2016-06-18 ENCOUNTER — Encounter
Payer: Managed Care, Other (non HMO) | Attending: Physical Medicine & Rehabilitation | Admitting: Physical Medicine & Rehabilitation

## 2016-06-18 DIAGNOSIS — Z9889 Other specified postprocedural states: Secondary | ICD-10-CM | POA: Insufficient documentation

## 2016-06-18 DIAGNOSIS — Z8673 Personal history of transient ischemic attack (TIA), and cerebral infarction without residual deficits: Secondary | ICD-10-CM | POA: Insufficient documentation

## 2016-06-18 DIAGNOSIS — I2510 Atherosclerotic heart disease of native coronary artery without angina pectoris: Secondary | ICD-10-CM | POA: Insufficient documentation

## 2016-06-18 DIAGNOSIS — S02609A Fracture of mandible, unspecified, initial encounter for closed fracture: Secondary | ICD-10-CM | POA: Insufficient documentation

## 2016-06-18 DIAGNOSIS — S069X0A Unspecified intracranial injury without loss of consciousness, initial encounter: Secondary | ICD-10-CM | POA: Insufficient documentation

## 2016-06-18 DIAGNOSIS — R42 Dizziness and giddiness: Secondary | ICD-10-CM | POA: Insufficient documentation

## 2016-06-18 DIAGNOSIS — Z87891 Personal history of nicotine dependence: Secondary | ICD-10-CM | POA: Insufficient documentation

## 2016-06-18 DIAGNOSIS — R200 Anesthesia of skin: Secondary | ICD-10-CM | POA: Insufficient documentation

## 2016-06-18 DIAGNOSIS — E785 Hyperlipidemia, unspecified: Secondary | ICD-10-CM | POA: Insufficient documentation

## 2016-06-18 DIAGNOSIS — N189 Chronic kidney disease, unspecified: Secondary | ICD-10-CM | POA: Insufficient documentation

## 2016-06-18 DIAGNOSIS — G4489 Other headache syndrome: Secondary | ICD-10-CM | POA: Insufficient documentation

## 2016-06-18 DIAGNOSIS — G8918 Other acute postprocedural pain: Secondary | ICD-10-CM | POA: Insufficient documentation

## 2016-12-05 IMAGING — CT CT CERVICAL SPINE W/O CM
2 of 18 series · 4 of 33 positions shown, 5 images · non-contrast
Comparison: None.

CLINICAL DATA: Trip and fall injury down steps. Injuries to the
head. Increasingly lethargic since the fall. Alcohol use.

EXAM:
CT HEAD WITHOUT CONTRAST
CT MAXILLOFACIAL WITHOUT CONTRAST
CT CERVICAL SPINE WITHOUT CONTRAST
TECHNIQUE: Multidetector CT imaging of the head, cervical spine, and
maxillofacial structures were performed using the standard protocol
without intravenous contrast. Multiplanar CT image reconstructions
of the cervical spine and maxillofacial structures were also
generated.

[Series 12: facialbone 2.0 st · axial · 0.39mm/px · z∈[-353,-149]mm · 2 of 103 slices shown, 3 images]
[im 1/103  soft-tissue]
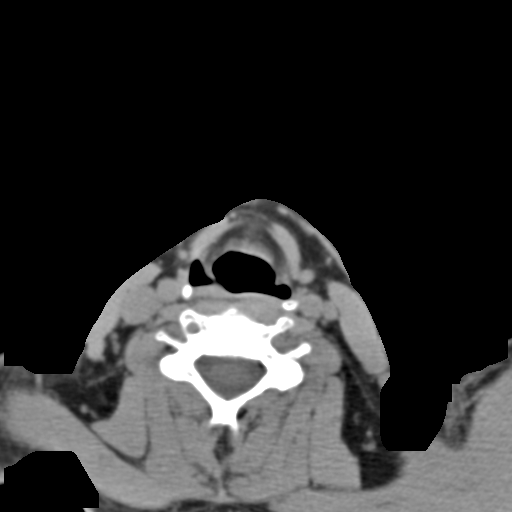
[im 1/103  bone]
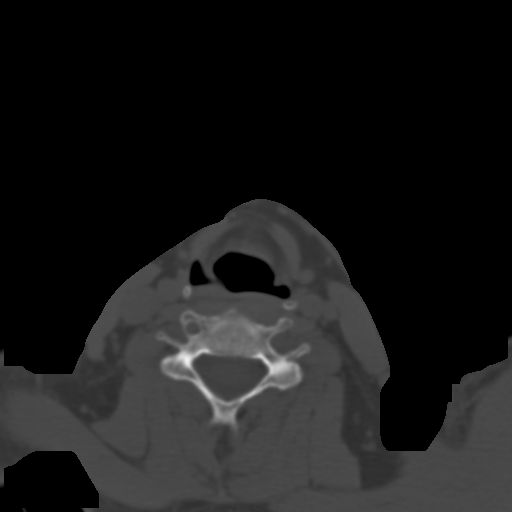
[im 103/103  bone]
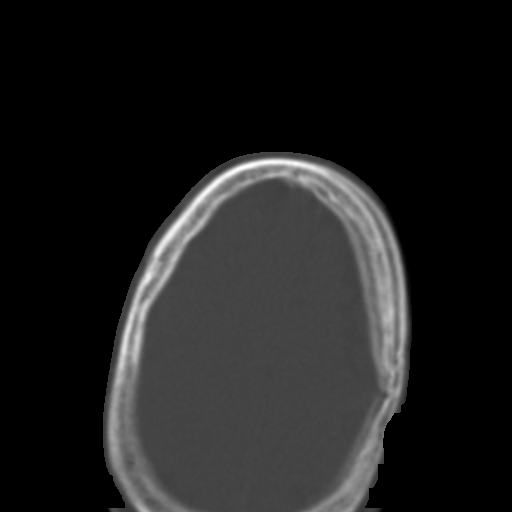

[Series 17: facialbone 2.0 sag st · sagittal · 0.37mm/px · 2 of 86 slices shown]
[im 29/86  bone]
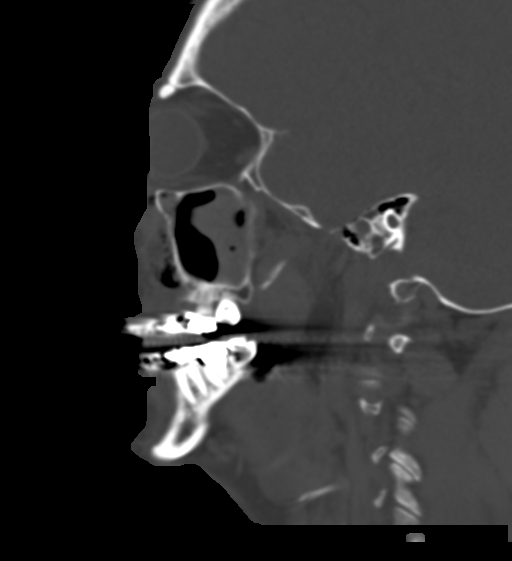
[im 57/86  bone]
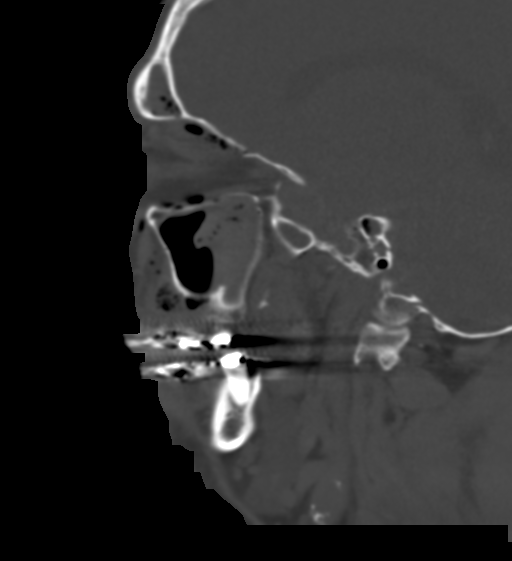

[4 of 33 positions shown; findings below may reference images not displayed]

FINDINGS: CT HEAD FINDINGS

Brain: Pneumocephalus in the skullbase. Acute subarachnoid
hemorrhage filling suprasellar cisterns and anterior basal
subarachnoid spaces. Can't exclude small intraparenchymal hematomas
around the periphery. No mass effect or midline shift. Gray-white
matter junctions are distinct. Basal cisterns are not effaced.
Ventricles are not dilated.

Vascular: No hyperdense vessel or unexpected calcification.

Skull: Mildly depressed basal skull fractures involving the greater
wing of the sphenoid bone extending to the anterior temporal bone at
the temporomandibular joint. Fractures across the base of the clivus
extending from right to left. Non depressed fractures of the left
sphenoid bone involving the greater wing, extending to the sphenoid
sinus and to the temporomandibular joint. Calvarium appears intact.

Sinuses/Orbits: See below. Mastoid air cells are not opacified.
External and middle ear cavities are not opacified.

Other: Examination is technically limited due to significant motion
artifact throughout the examination.

CT MAXILLOFACIAL FINDINGS

Osseous: Depressed anterior nasal bone fractures. Fractures of the
medial orbital walls bilaterally and of the left superior and
inferior orbital walls. Fractures of the lateral, anterior, and
inferior maxillary antral walls bilaterally. Fractures of the
pterygoid plates on the right. Displaced fractures of the posterior
nasal septum. Fracture of the right hard palate and maxilla.
Mandibles and temporomandibular joints appear intact.

Orbits: Globes and extraocular muscles appear intact and
symmetrical. The no evidence of herniation or entrapment of the
extraocular muscles. Bilateral periorbital hematomas and
emphysematous changes. Bilateral extraconal retrobulbar emphysema,
greater on the left.

Sinuses: Opacification of the left frontal, bilateral ethmoid,
bilateral sphenoid, and bilateral maxillary sinuses.

Soft tissues: Prominent soft tissue swelling and subcutaneous
emphysema in the facial regions inferior to the orbits and along the
cheeks bilaterally. Soft tissue emphysema demonstrated in the
masticator muscle spaces and along the right mandible.

Limited intracranial: See above.

Other:  Examination is limited due to motion artifact.

CT CERVICAL SPINE FINDINGS

Alignment: Normal

Skull base and vertebrae: Fractures of the skullbase as previously
discussed. No vertebral compression deformities. Ununited ossicles
are demonstrated at the anterior inferior endplate of C3 and at the
anterior superior endplate of C6 which are not definitely corticated
and could represent corner fractures. The posterior elements appear
intact. C1-2 articulation appears intact.

Soft tissues and spinal canal: Gas bubbles demonstrated within the
central canal at the level of C2 through C5. This likely represents
gas within the CSF space, tracking down from the intracranial
contents. No prevertebral soft tissue swelling. No central canal
hematoma demonstrated.

Disc levels: Mild degenerative changes in the cervical spine with
mild endplate hypertrophic changes present.

Upper chest: Emphysematous changes in the lung apices.

Other: Examination is limited due to motion artifact.
IMPRESSION: CT head: Acute intracranial subarachnoid and possibly
intraparenchymal hemorrhages along the anterior frontal region.
Acute hemorrhage in the suprasellar cisterns. Pneumocephalus. No
mass-effect or midline shift.

CT maxillary: Fractures as described throughout the skull base
involving the sphenoid bones, clivus, and temporomandibular joints.
Fractures of bilateral orbits and maxillary antral walls as
described. Fractures also involve the pterygoid plates on the right.
Associated soft tissue hematomas and soft tissue emphysema in the
orbital and facial regions as described.

CT cervical spine: Normal alignment. No acute displaced fractures,
but cannot exclude anterior superior endplate corner fracture at C6
and anterior inferior endplate on a fracture at C3. Gas within the
spinal canal, probably within the CSF space.

These results were called by telephone at the time of interpretation
on 01/05/2016 at [DATE] to Dr. NAZARETH JUMPER , who verbally
acknowledged these results.

## 2016-12-14 IMAGING — RF DG LUMBAR SPINE 1V
1 series · 1 of 1 positions shown · non-contrast
Comparison: None.

CLINICAL DATA: CSF rhinorrhea

EXAM:
LUMBAR SPINE - 1 VIEW; DG C-ARM 61-120 MIN

[Series 1: run · 1 of 1 slices shown]
[im 1/1]
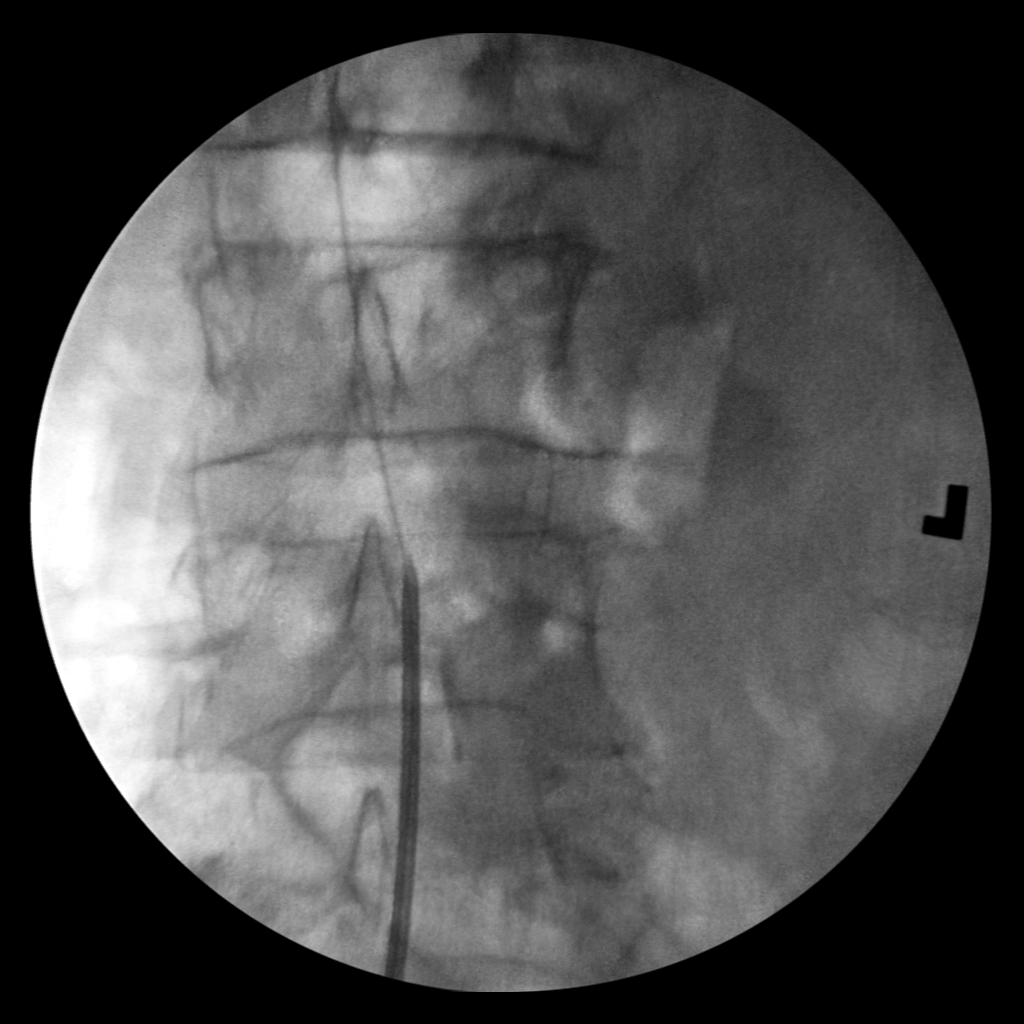

[1 of 1 positions shown; findings below may reference images not displayed]

FINDINGS: Single intraoperative spot image demonstrates catheter tip
projecting over the midline of the spine.
IMPRESSION: As above.

## 2018-11-09 ENCOUNTER — Other Ambulatory Visit: Payer: Self-pay

## 2018-11-09 DIAGNOSIS — Z20822 Contact with and (suspected) exposure to covid-19: Secondary | ICD-10-CM

## 2018-11-10 LAB — NOVEL CORONAVIRUS, NAA: SARS-CoV-2, NAA: NOT DETECTED

## 2019-10-20 ENCOUNTER — Other Ambulatory Visit: Payer: Self-pay

## 2019-10-21 ENCOUNTER — Telehealth: Payer: Self-pay | Admitting: Family Medicine

## 2019-10-21 ENCOUNTER — Ambulatory Visit: Payer: Self-pay | Admitting: Family Medicine

## 2019-10-21 ENCOUNTER — Encounter: Payer: Self-pay | Admitting: Family Medicine

## 2019-10-21 VITALS — BP 132/78 | HR 64 | Temp 98.1°F | Ht 71.0 in | Wt 187.7 lb

## 2019-10-21 DIAGNOSIS — R310 Gross hematuria: Secondary | ICD-10-CM | POA: Insufficient documentation

## 2019-10-21 LAB — PSA: PSA: 0.3 ng/mL (ref <=4.0)

## 2019-10-21 NOTE — Patient Instructions (Signed)
Hematuria, Adult Hematuria is blood in the urine. Blood may be visible in the urine, or it may be identified with a test. This condition can be caused by infections of the bladder, urethra, kidney, or prostate. Other possible causes include:  Kidney stones.  Cancer of the urinary tract.  Too much calcium in the urine.  Conditions that are passed from parent to child (inherited conditions).  Exercise that requires a lot of energy. Infections can usually be treated with medicine, and a kidney stone usually will pass through your urine. If neither of these is the cause of your hematuria, more tests may be needed to identify the cause of your symptoms. It is very important to tell your health care provider about any blood in your urine, even if it is painless or the blood stops without treatment. Blood in the urine, when it happens and then stops and then happens again, can be a symptom of a very serious condition, including cancer. There is no pain in the initial stages of many urinary cancers. Follow these instructions at home: Medicines  Take over-the-counter and prescription medicines only as told by your health care provider.  If you were prescribed an antibiotic medicine, take it as told by your health care provider. Do not stop taking the antibiotic even if you start to feel better. Eating and drinking  Drink enough fluid to keep your urine clear or pale yellow. It is recommended that you drink 3-4 quarts (2.8-3.8 L) a day. If you have been diagnosed with an infection, it is recommended that you drink cranberry juice in addition to large amounts of water.  Avoid caffeine, tea, and carbonated beverages. These tend to irritate the bladder.  Avoid alcohol because it may irritate the prostate (men). General instructions  If you have been diagnosed with a kidney stone, follow your health care provider's instructions about straining your urine to catch the stone.  Empty your bladder  often. Avoid holding urine for long periods of time.  If you are male: ? After a bowel movement, wipe from front to back and use each piece of toilet paper only once. ? Empty your bladder before and after sex.  Pay attention to any changes in your symptoms. Tell your health care provider about any changes or any new symptoms.  It is your responsibility to get your test results. Ask your health care provider, or the department performing the test, when your results will be ready.  Keep all follow-up visits as told by your health care provider. This is important. Contact a health care provider if:  You develop back pain.  You have a fever.  You have nausea or vomiting.  Your symptoms do not improve after 3 days.  Your symptoms get worse. Get help right away if:  You develop severe vomiting and are unable take medicine without vomiting.  You develop severe pain in your back or abdomen even though you are taking medicine.  You pass a large amount of blood in your urine.  You pass blood clots in your urine.  You feel very weak or like you might faint.  You faint. Summary  Hematuria is blood in the urine. It has many possible causes.  It is very important that you tell your health care provider about any blood in your urine, even if it is painless or the blood stops without treatment.  Take over-the-counter and prescription medicines only as told by your health care provider.  Drink enough fluid to keep   your urine clear or pale yellow. This information is not intended to replace advice given to you by your health care provider. Make sure you discuss any questions you have with your health care provider. Document Revised: 08/18/2018 Document Reviewed: 04/26/2016 Elsevier Patient Education  2020 [employer]  

## 2019-10-21 NOTE — Telephone Encounter (Signed)
Patient is calling and wanted to let Dr. Ethelene Hal know that there were a few medications on his medication list that he wasn't taking anymore. Medications are methocarbamol and topiramate, please advise. CB is [phone].

## 2019-10-21 NOTE — Progress Notes (Signed)
New Patient Office Visit  Subjective:  Patient ID: Patrick [name], male    DOB: 08-11-1955  Age: 64 y.o. MRN: [id number]  CC:  Chief Complaint  Patient presents with  . Establish Care    HPI Patrick [name] presents for establishment of care and follow-up of intermittent episodes of hematuria with clots that have been asymptomatic.  History of renal lithiasis accompanied by renal colic with hematuria.  Denies renal colic symptoms with his current hematuria.  Admits BPH symptoms with hesitancy of urination that are worse at sometimes than others.  Patient has episodes of urinary frequency with incomplete emptying as well.  Patient denies nausea vomiting fever chills weight loss.  Quit smoking 30 years ago.  Past Medical History:  Diagnosis Date  . Coronary artery disease    moderate  . Hyperlipidemia   . TIA (transient ischemic attack)     Past Surgical History:  Procedure Laterality Date  . MANDIBULAR HARDWARE REMOVAL N/A 02/21/2016   Procedure: MANDIBULAR HARDWARE REMOVAL;  Surgeon: Wallace Going, DO;  Location: [facility];  Service: Plastics;  Laterality: N/A;  . ORIF MANDIBULAR FRACTURE N/A 01/07/2016   Procedure: OPEN REDUCTION INTERNAL FIXATION (ORIF) MANDIBULAR FRACTURE;  Surgeon: Loel Lofty Dillingham, DO;  Location: [facility];  Service: Plastics;  Laterality: N/A;  . PLACEMENT OF LUMBAR DRAIN N/A 01/14/2016   Procedure: PLACEMENT OF LUMBAR DRAIN;  Surgeon: Newman Pies, MD;  Location: [facility];  Service: Neurosurgery;  Laterality: N/A;    Family History  Problem Relation Age of Onset  . Cancer Mother   . Heart disease Father     Social History   Socioeconomic History  . Marital status: Married    Spouse name: Not on file  . Number of children: Not on file  . Years of education: Not on file  . Highest education level: Not on file  Occupational History  . Not on file  Tobacco Use  . Smoking status: Former Smoker    Types: Cigarettes     Quit date: 01/06/1995    Years since quitting: 24.8  . Smokeless tobacco: Never Used  Substance and Sexual Activity  . Alcohol use: Yes  . Drug use: No  . Sexual activity: Not on file  Other Topics Concern  . Not on file  Social History Narrative  . Not on file   Social Determinants of Health   Financial Resource Strain:   . Difficulty of Paying Living Expenses:   Food Insecurity:   . Worried About Charity fundraiser in the Last Year:   . Arboriculturist in the Last Year:   Transportation Needs:   . Film/video editor (Medical):   Marland Kitchen Lack of Transportation (Non-Medical):   Physical Activity:   . Days of Exercise per Week:   . Minutes of Exercise per Session:   Stress:   . Feeling of Stress :   Social Connections:   . Frequency of Communication with Friends and Family:   . Frequency of Social Gatherings with Friends and Family:   . Attends Religious Services:   . Active Member of Clubs or Organizations:   . Attends Archivist Meetings:   Marland Kitchen Marital Status:   Intimate Partner Violence:   . Fear of Current or Ex-Partner:   . Emotionally Abused:   Marland Kitchen Physically Abused:   . Sexually Abused:     ROS Review of Systems  Constitutional: Negative for chills, diaphoresis, fatigue, fever and unexpected  weight change.  Respiratory: Negative.   Cardiovascular: Negative.   Gastrointestinal: Negative for abdominal pain, anal bleeding, blood in stool, nausea and vomiting.  Endocrine: Negative for polyphagia and polyuria.  Genitourinary: Positive for difficulty urinating and hematuria. Negative for discharge, dysuria and flank pain.  Allergic/Immunologic: Negative for immunocompromised state.  Neurological: Negative for light-headedness and headaches.  Hematological: Does not bruise/bleed easily.  Psychiatric/Behavioral: Negative.     Objective:   Today's Vitals: BP 132/78   Pulse 64   Temp 98.1 F (36.7 C) (Tympanic)   Ht 5\' 11"  (1.803 m)   Wt 187 lb 11.2 oz  (85.1 kg)   SpO2 96%   BMI 26.18 kg/m   Physical Exam Vitals and nursing note reviewed.  Constitutional:      General: He is not in acute distress.    Appearance: Normal appearance. He is not ill-appearing, toxic-appearing or diaphoretic.  HENT:     Head: Normocephalic and atraumatic.     Right Ear: External ear normal.     Left Ear: External ear normal.  Eyes:     General: No scleral icterus.       Right eye: No discharge.        Left eye: No discharge.     Extraocular Movements: Extraocular movements intact.     Conjunctiva/sclera: Conjunctivae normal.     Pupils: Pupils are equal, round, and reactive to light.  Cardiovascular:     Rate and Rhythm: Normal rate and regular rhythm.  Pulmonary:     Effort: Pulmonary effort is normal.     Breath sounds: Normal breath sounds.  Abdominal:     General: Bowel sounds are normal.     Tenderness: There is no right CVA tenderness or left CVA tenderness.  Genitourinary:    Prostate: Enlarged. Not tender and no nodules present.     Rectum: Guaiac result negative. External hemorrhoid present. No mass, tenderness, anal fissure or internal hemorrhoid. Normal anal tone.  Skin:    General: Skin is warm and dry.     Coloration: Skin is not jaundiced.  Neurological:     Mental Status: He is alert and oriented to person, place, and time.  Psychiatric:        Mood and Affect: Mood normal.        Behavior: Behavior normal.     Assessment & Plan:   Problem List Items Addressed This Visit      Genitourinary   Gross hematuria - Primary   Relevant Orders   Ambulatory referral to Urology   PSA   Urinalysis, Routine w reflex microscopic   Urine Culture      Outpatient Encounter Medications as of 10/21/2019  Medication Sig  . amLODipine (NORVASC) 5 MG tablet Take by mouth.  Marland Kitchen aspirin 81 MG EC tablet Take by mouth.  . ezetimibe (ZETIA) 10 MG tablet Take by mouth.  . Omega-3 Fatty Acids (FISH OIL) 1000 MG CAPS Take by mouth.  .  rosuvastatin (CRESTOR) 40 MG tablet Take by mouth.  . tamsulosin (FLOMAX) 0.4 MG CAPS capsule Take 1 capsule (0.4 mg total) by mouth daily.  . vitamin C (ASCORBIC ACID) 250 MG tablet Take 250 mg by mouth daily.  . methocarbamol (ROBAXIN) 500 MG tablet Take 1 tablet (500 mg total) by mouth every 6 (six) hours as needed for muscle spasms. (Patient not taking: Reported on 03/19/2016)  . Multiple Vitamin (MULTIVITAMIN) LIQD Take 15 mLs by mouth daily. (Patient not taking: Reported on 03/19/2016)  . topiramate (  TOPAMAX) 50 MG tablet Take 1 tablet (50 mg total) by mouth daily after supper. (Patient not taking: Reported on 03/19/2016)   No facility-administered encounter medications on file as of 10/21/2019.    Follow-up: Return in about 5 months (around 03/22/2020).   Libby Maw, MD

## 2019-10-22 LAB — URINE CULTURE
MICRO NUMBER:: 10715139
Result:: NO GROWTH
SPECIMEN QUALITY:: ADEQUATE

## 2019-10-22 LAB — URINALYSIS, ROUTINE W REFLEX MICROSCOPIC
Bacteria, UA: NONE SEEN /HPF
Bilirubin Urine: NEGATIVE
Glucose, UA: NEGATIVE
Hyaline Cast: NONE SEEN /LPF
Ketones, ur: NEGATIVE
Leukocytes,Ua: NEGATIVE
Nitrite: NEGATIVE
Protein, ur: NEGATIVE
RBC / HPF: NONE SEEN /HPF (ref 0–2)
Specific Gravity, Urine: 1.008 (ref 1.001–1.03)
WBC, UA: NONE SEEN /HPF (ref 0–5)
pH: 6.5 (ref 5.0–8.0)

## 2019-10-24 NOTE — Telephone Encounter (Signed)
Medication showing as patient not taking tried to call and inform patient, no answer

## 2019-11-18 ENCOUNTER — Ambulatory Visit: Payer: Self-pay | Admitting: Family Medicine

## 2019-11-18 ENCOUNTER — Telehealth: Payer: Self-pay | Admitting: Family Medicine

## 2019-11-18 NOTE — Telephone Encounter (Signed)
They may want to do cystoscopy first. MRI not indicated right now.

## 2019-11-18 NOTE — Telephone Encounter (Signed)
Please advise message below  °

## 2019-11-18 NOTE — Telephone Encounter (Signed)
Patient called and stated that Dr. Ethelene Hal referred him to see a urologist but is not able to be seen until mid September. Patient wanted to see if Dr. Ethelene Hal could put an order for him to get a MRI, please advise. CB is [phone]

## 2019-11-22 NOTE — Telephone Encounter (Signed)
Spoke with patient who states that he called them back and was able to get a sooner appointment this week so no more concerns at this time.

## 2020-04-17 DIAGNOSIS — R350 Frequency of micturition: Secondary | ICD-10-CM | POA: Diagnosis not present

## 2020-04-17 DIAGNOSIS — N401 Enlarged prostate with lower urinary tract symptoms: Secondary | ICD-10-CM | POA: Diagnosis not present

## 2020-04-17 DIAGNOSIS — N200 Calculus of kidney: Secondary | ICD-10-CM | POA: Diagnosis not present

## 2020-08-31 ENCOUNTER — Telehealth: Payer: Self-pay | Admitting: Family Medicine

## 2020-08-31 NOTE — Telephone Encounter (Signed)
Nicole Kindred from [facility] is informing Dr Ethelene Hal that Patrick [name] went to a urologist a month ago for prostate issues and during the scan an abdominal aortic aneurysm was found. He was suppose to contact his PCP, he did not. Should we call and schedule an appointment?

## 2020-09-04 NOTE — Telephone Encounter (Signed)
Yes please give patient a call to schedule follow up visit.

## 2020-09-04 NOTE — Telephone Encounter (Signed)
I left pt a vm to cb to schedule a f/u appointment.

## 2020-09-07 NOTE — Telephone Encounter (Signed)
I scheduled pt an appointment.

## 2020-09-12 ENCOUNTER — Ambulatory Visit (INDEPENDENT_AMBULATORY_CARE_PROVIDER_SITE_OTHER): Payer: PPO | Admitting: Family Medicine

## 2020-09-12 ENCOUNTER — Other Ambulatory Visit: Payer: Self-pay

## 2020-09-12 ENCOUNTER — Encounter: Payer: Self-pay | Admitting: Family Medicine

## 2020-09-12 VITALS — BP 120/68 | HR 56 | Temp 97.7°F | Ht 71.0 in | Wt 190.8 lb

## 2020-09-12 DIAGNOSIS — I714 Abdominal aortic aneurysm, without rupture, unspecified: Secondary | ICD-10-CM | POA: Insufficient documentation

## 2020-09-12 DIAGNOSIS — I2583 Coronary atherosclerosis due to lipid rich plaque: Secondary | ICD-10-CM

## 2020-09-12 DIAGNOSIS — Z0000 Encounter for general adult medical examination without abnormal findings: Secondary | ICD-10-CM

## 2020-09-12 DIAGNOSIS — Z7289 Other problems related to lifestyle: Secondary | ICD-10-CM

## 2020-09-12 DIAGNOSIS — I10 Essential (primary) hypertension: Secondary | ICD-10-CM | POA: Diagnosis not present

## 2020-09-12 DIAGNOSIS — Z789 Other specified health status: Secondary | ICD-10-CM | POA: Insufficient documentation

## 2020-09-12 DIAGNOSIS — I2510 Atherosclerotic heart disease of native coronary artery without angina pectoris: Secondary | ICD-10-CM | POA: Diagnosis not present

## 2020-09-12 NOTE — Progress Notes (Addendum)
Established Patient Office Visit  Subjective:  Patient ID: Patrick [name], male    DOB: [birth year]  Age: 65 y.o. MRN: [id number]  CC:  Chief Complaint  Patient presents with   Referral    Patient would like referral to cardiologist for abdominal aortic aneurysm.     HPI Patrick [name] presents for follow-up of BPH and hematuria, hypertension, coronary artery disease,  abdominal aortic aneurysm,, alcohol use.  Wife is monitoring alcohol use.  She gives him a sixpack to drink over the weekend and no more.  Tells me that her AAA was noted on a CT scan obtained by urology.  History of coronary artery disease diagnosed 9 years ago.  He had gone in for a treadmill test and was immediately taken to the Cath Lab.  Moderate coronary artery disease was noted at that time.  No stents were placed.  Intensive statin therapy was recommended.  He has never had an MI to his knowledge.  Following up with urology for BPH.  He is taking finasteride and tamsulosin.  Still has symptoms.  Blood pressure appears to be well controlled on current regimen.   I  Past Medical History:  Diagnosis Date   Coronary artery disease    moderate   Hyperlipidemia    TIA (transient ischemic attack)     Past Surgical History:  Procedure Laterality Date   MANDIBULAR HARDWARE REMOVAL N/A 02/21/2016   Procedure: MANDIBULAR HARDWARE REMOVAL;  Surgeon: Wallace Going, DO;  Location: [facility];  Service: Plastics;  Laterality: N/A;   ORIF MANDIBULAR FRACTURE N/A 01/07/2016   Procedure: OPEN REDUCTION INTERNAL FIXATION (ORIF) MANDIBULAR FRACTURE;  Surgeon: Loel Lofty Dillingham, DO;  Location: [facility];  Service: Plastics;  Laterality: N/A;   PLACEMENT OF LUMBAR DRAIN N/A 01/14/2016   Procedure: PLACEMENT OF LUMBAR DRAIN;  Surgeon: Newman Pies, MD;  Location: [facility];  Service: Neurosurgery;  Laterality: N/A;    Family History  Problem Relation Age of Onset   Cancer Mother    Heart disease Father      Social History   Socioeconomic History   Marital status: Married    Spouse name: Not on file   Number of children: Not on file   Years of education: Not on file   Highest education level: Not on file  Occupational History   Not on file  Tobacco Use   Smoking status: Former    Pack years: 0.00    Types: Cigarettes    Quit date: 01/06/1995    Years since quitting: 25.7   Smokeless tobacco: Never  Substance and Sexual Activity   Alcohol use: Yes   Drug use: No   Sexual activity: Not on file  Other Topics Concern   Not on file  Social History Narrative   Not on file   Social Determinants of Health   Financial Resource Strain: Not on file  Food Insecurity: Not on file  Transportation Needs: Not on file  Physical Activity: Not on file  Stress: Not on file  Social Connections: Not on file  Intimate Partner Violence: Not on file    Outpatient Medications Prior to Visit  Medication Sig Dispense Refill   amLODipine (NORVASC) 5 MG tablet Take by mouth.     finasteride (PROSCAR) 5 MG tablet Take 5 mg by mouth daily.     Multiple Vitamin (MULTIVITAMIN) LIQD Take 15 mLs by mouth daily. 1 Bottle 0   Omega-3 Fatty Acids (FISH OIL) 1000 MG CAPS  Take by mouth.     rosuvastatin (CRESTOR) 40 MG tablet Take by mouth.     tamsulosin (FLOMAX) 0.4 MG CAPS capsule Take 1 capsule (0.4 mg total) by mouth daily. 30 capsule 0   aspirin 81 MG EC tablet Take by mouth. (Patient not taking: Reported on 09/12/2020)     ezetimibe (ZETIA) 10 MG tablet Take by mouth. (Patient not taking: Reported on 09/12/2020)     methocarbamol (ROBAXIN) 500 MG tablet Take 1 tablet (500 mg total) by mouth every 6 (six) hours as needed for muscle spasms. (Patient not taking: No sig reported) 60 tablet 0   topiramate (TOPAMAX) 50 MG tablet Take 1 tablet (50 mg total) by mouth daily after supper. (Patient not taking: Reported on 03/19/2016) 30 tablet 0   vitamin C (ASCORBIC ACID) 250 MG tablet Take 250 mg by mouth daily.  (Patient not taking: Reported on 09/12/2020)     No facility-administered medications prior to visit.    Allergies  Allergen Reactions   Acetaminophen Other (See Comments)    GI upset GI upset    ROS Review of Systems  Constitutional: Negative.   HENT: Negative.    Eyes:  Negative for photophobia and visual disturbance.  Respiratory: Negative.  Negative for chest tightness and shortness of breath.   Cardiovascular: Negative.  Negative for chest pain and palpitations.  Gastrointestinal: Negative.   Endocrine: Negative for polyphagia and polyuria.  Genitourinary:  Positive for difficulty urinating, frequency and urgency.  Musculoskeletal: Negative.   Neurological:  Negative for speech difficulty and weakness.  Psychiatric/Behavioral: Negative.       Objective:    Physical Exam Vitals and nursing note reviewed.  Constitutional:      General: He is not in acute distress.    Appearance: Normal appearance. He is normal weight. He is not ill-appearing, toxic-appearing or diaphoretic.  HENT:     Head: Normocephalic and atraumatic.     Right Ear: Tympanic membrane, ear canal and external ear normal.     Left Ear: Tympanic membrane, ear canal and external ear normal.     Mouth/Throat:     Mouth: Mucous membranes are moist.     Pharynx: Oropharynx is clear. No oropharyngeal exudate.  Eyes:     General:        Right eye: No discharge.        Left eye: No discharge.     Extraocular Movements: Extraocular movements intact.     Conjunctiva/sclera: Conjunctivae normal.     Pupils: Pupils are equal, round, and reactive to light.  Neck:     Vascular: No carotid bruit.  Cardiovascular:     Rate and Rhythm: Normal rate and regular rhythm.  Pulmonary:     Effort: Pulmonary effort is normal.     Breath sounds: Normal breath sounds.  Abdominal:     General: Abdomen is flat. Bowel sounds are normal. There is no distension.     Palpations: Abdomen is soft. There is no mass.      Tenderness: There is no abdominal tenderness. There is no guarding or rebound.     Hernia: No hernia is present.    Musculoskeletal:     Cervical back: Normal range of motion and neck supple. No rigidity or tenderness.  Lymphadenopathy:     Cervical: No cervical adenopathy.  Skin:    General: Skin is warm and dry.  Neurological:     Mental Status: He is alert and oriented to person, place, and time.  Psychiatric:        Mood and Affect: Mood normal.        Behavior: Behavior normal.    BP 120/68   Pulse (!) 56   Temp 97.7 F (36.5 C) (Temporal)   Ht 5\' 11"  (1.803 m)   Wt 190 lb 12.8 oz (86.5 kg)   SpO2 95%   BMI 26.61 kg/m  Wt Readings from Last 3 Encounters:  09/12/20 190 lb 12.8 oz (86.5 kg)  10/21/19 187 lb 11.2 oz (85.1 kg)  02/21/16 170 lb (77.1 kg)     Health Maintenance Due  Topic Date Due   HIV Screening  Never done   Hepatitis C Screening  Never done   COLONOSCOPY (Pts 45-33yrs Insurance coverage will need to be confirmed)  Never done   Zoster Vaccines- Shingrix (1 of 2) Never done   PNA vac Low Risk Adult (1 of 2 - PCV13) 12/30/2019   COVID-19 Vaccine (4 - Booster for [place] series) 05/17/2020    There are no preventive care reminders to display for this patient.  No results found for: TSH Lab Results  Component Value Date   WBC 4.6 09/13/2020   HGB 13.7 09/13/2020   HCT 40.2 09/13/2020   MCV 87.6 09/13/2020   PLT 171.0 09/13/2020   Lab Results  Component Value Date   NA 140 09/13/2020   K 4.0 09/13/2020   CO2 28 09/13/2020   GLUCOSE 104 (H) 09/13/2020   BUN 15 09/13/2020   CREATININE 0.99 09/13/2020   BILITOT 0.7 09/13/2020   ALKPHOS 46 09/13/2020   AST 14 09/13/2020   ALT 15 09/13/2020   PROT 6.9 09/13/2020   ALBUMIN 4.6 09/13/2020   CALCIUM 9.2 09/13/2020   ANIONGAP 11 01/23/2016   GFR 79.83 09/13/2020   Lab Results  Component Value Date   CHOL 167 09/13/2020   Lab Results  Component Value Date   HDL 41.00 09/13/2020   Lab  Results  Component Value Date   LDLCALC 98 09/13/2020   Lab Results  Component Value Date   TRIG 138.0 09/13/2020   Lab Results  Component Value Date   CHOLHDL 4 09/13/2020   No results found for: HGBA1C    Assessment & Plan:   Problem List Items Addressed This Visit       Cardiovascular and Mediastinum   Abdominal aortic aneurysm (AAA) without rupture (Export)   Relevant Orders   Comprehensive metabolic panel (Completed)   LDL cholesterol, direct (Completed)   Lipid panel (Completed)   Ambulatory referral to Cardiology   Ambulatory referral to Cardiology   Coronary artery disease due to lipid rich plaque   Relevant Orders   Comprehensive metabolic panel (Completed)   LDL cholesterol, direct (Completed)   Lipid panel (Completed)   Ambulatory referral to Cardiology   Essential hypertension - Primary   Relevant Orders   CBC (Completed)   Comprehensive metabolic panel (Completed)   Urinalysis, Routine w reflex microscopic (Completed)     Other   Alcohol use   Relevant Orders   Comprehensive metabolic panel (Completed)   Other Visit Diagnoses     Healthcare maintenance       Relevant Orders   Ambulatory referral to Gastroenterology       No orders of the defined types were placed in this encounter.   Follow-up: Return in about 6 months (around 03/14/2021).  Given information on coronary artery disease and AAA.  Libby Maw, MD

## 2020-09-12 NOTE — Patient Instructions (Signed)
Abdominal Aortic Aneurysm  An abdominal aortic aneurysm (AAA) is an aneurysm that occurs in the lower part of the aorta. The aorta is the main artery of the body, and it supplies blood from the heart to the rest of the body. An aneurysm is a bulge in an artery. An aneurysm happens when blood pushes against a weakened or damaged artery wall. Most aneurysms do not cause symptoms, but some do cause problems. An AAA can cause two serious problems:  It can enlarge and burst.  It can cause blood to flow between the layers of the wall of the aorta through a tear (aortic dissection). These problems are medical emergencies. They can cause bleeding inside the body. If they are not diagnosed and treated right away, they can be life-threatening. What are the causes? The exact cause of this condition is not known. What increases the risk? The following factors may make you more likely to develop this condition:  Being male and 66 years of age or older.  Being of [place] descent.  Using or having used nicotine or tobacco products.  Having a family history of aneurysms.  Having any of these conditions: ? Hardening of your arteries (arteriosclerosis). ? Inflammation of the walls of an artery (arteritis). ? Certain genetic conditions. ? Obesity. ? An infection in the wall of your aorta (infectious aortitis) caused by bacteria. ? High cholesterol. ? High blood pressure (hypertension). What are the signs or symptoms? Symptoms of this condition vary depending on the size of your aneurysm and how fast it is growing. Most aneurysms grow slowly and do not cause symptoms. When symptoms do occur, they may include:  Severe pain in your abdomen, side, or lower back.  Feeling full after eating only small amounts of food.  Feeling a throbbing lump in your abdomen.  Painful feet or toes, or discolored skin or sores on feet or toes.  Constipation or trouble urinating. Symptoms of an AAA that has  burst include:  Severe pain in your abdomen, side, or back that comes on suddenly.  Nausea or vomiting.  Feeling light-headed or fainting. How is this diagnosed? This condition may be diagnosed with:  A physical exam to check for throbbing and to listen to blood flow in your abdomen.  Tests, such as: ? Ultrasound. ? X-rays. ? CT scan. ? MRI. ? Angiograms. These tests check your arteries for damage or blockage. Because most AAAs that have not burst do not cause symptoms, they are often found during exams for other conditions. How is this treated? Treatment for this condition depends on:  The size of your aneurysm.  How fast your aneurysm is growing.  Your age.  Risk factors for a burst AAA. If your aneurysm is smaller than 2 inches (5 cm), your health care provider may:  Monitor it regularly to see if it is getting bigger. Depending on the size of the aneurysm, how fast it is growing, and your other risk factors, you may have an ultrasound to monitor it every 3-6 months, every year, or every few years.  Give you medicines to control blood pressure, treat pain, or fight infection. If your aneurysm is larger than 2 inches (5 cm), your health care provider may repair it with surgery. Follow these instructions at home: Eating and drinking  Eat a heart-healthy diet. This includes plenty of fresh fruits and vegetables, whole grains, low-fat (lean) protein, and low-fat dairy products.  Avoid foods that are high in saturated fat and cholesterol, such  as red meat and some dairy products.   Lifestyle  Do not use any products that contain nicotine or tobacco, such as cigarettes, e-cigarettes, and chewing tobacco. If you need help quitting, ask your health care provider.  Stay physically active and exercise regularly. Talk with your health care provider about how often to exercise and which types of exercise are safe for you.  Maintain a healthy weight.      Alcohol use  Do not  drink alcohol if: ? Your health care provider tells you not to drink. ? You are pregnant, may be pregnant, or are planning to become pregnant.  If you drink alcohol: ? Limit how much you use to:  0-1 drink a day for women.  0-2 drinks a day for men. ? Be aware of how much alcohol is in your drink. In the [place] one drink equals one 12 oz bottle of beer (355 mL), one 5 oz glass of wine (148 mL), or one 1 oz glass of hard liquor (44 mL). General instructions  Take over-the-counter and prescription medicines only as told by your health care provider.  Keep your blood pressure within a normal range. Check it regularly, and ask your health care provider what your target blood pressure should be.  Have your blood sugar (glucose) level and cholesterol levels checked regularly. Follow instructions on how to keep levels within normal limits.  Avoid heavy lifting and activities that take a lot of effort. Ask what activities are safe for you.  If you can, learn your family's health history.  Keep all follow-up visits as told by your health care provider. This is important. Contact a health care provider if you have:  Pain in your abdomen, side, or back.  Throbbing in your abdomen.  A fever. Get help right away if:  You have sudden, severe pain in your abdomen, side, or back.  You experience nausea or vomiting.  You feel light-headed or you faint.  Your heart beats fast when you stand.  You have sweaty, clammy skin.  You have shortness of breath.  You have constipation or trouble urinating. These symptoms may represent a serious problem that is an emergency. Do not wait to see if the symptoms will go away. Get medical help right away. Call your local emergency services (911 in the [place] Do not drive yourself to the hospital. Summary  An aneurysm is a bulge in an artery. An abdominal aortic aneurysm (AAA) is an aneurysm in the lower part of the aorta.  An AAA can cause bleeding  inside the body, and it can be life-threatening.  Risk can increase if you are male, age 66 or older, and of [place] descent, or if you have used nicotine or tobacco products and have a family history of aneurysms.  Get help right away if you have symptoms of a burst AAA. This information is not intended to replace advice given to you by your health care provider. Make sure you discuss any questions you have with your health care provider. Document Revised: 01/07/2019 Document Reviewed: 01/07/2019 Elsevier Patient Education  2021 [place].  Coronary Artery Disease, Male Coronary artery disease (CAD) is a condition in which the arteries that lead to the heart (coronary arteries) become narrow or blocked. The narrowing or blockage can lead to decreased blood flow to the heart. Prolonged reduced blood flow can cause a heart attack (myocardial infarction or MI). This condition may also be called coronary heart disease. Because CAD is the  leading cause of death in men, it is important to understand what causes this condition and how it is treated. What are the causes? CAD is most often caused by atherosclerosis. This is the buildup of fat and cholesterol (plaque) on the inside of the arteries. Over time, the plaque may narrow or block the artery, reducing blood flow to the heart. Plaque can also become weak and break off within a coronary artery and cause a sudden blockage. Other less common causes of CAD include:  A blood clot or a piece of a blood clot or other substance that blocks the flow of blood in a coronary artery (embolism).  A tearing of the artery (spontaneous coronary artery dissection).  An enlargement of an artery (aneurysm).  Inflammation (vasculitis) in the artery wall.   What increases the risk? The following factors may make you more likely to develop this condition:  Age. Men over age 54 are at a greater risk of CAD.  Family history of CAD.  Gender. Men often  develop CAD earlier in life than women.  High blood pressure (hypertension).  Diabetes.  High cholesterol levels.  Tobacco use.  Excessive alcohol use.  Lack of exercise.  A diet high in saturated and trans fats, such as fried food and processed meat. Other possible risk factors include:  High stress levels.  Depression.  Obesity.  Sleep apnea. What are the signs or symptoms? Many people do not have any symptoms during the early stages of CAD. As the condition progresses, symptoms may include:  Chest pain (angina). The pain can: ? Feel like crushing or squeezing, or like a tightness, pressure, fullness, or heaviness in the chest. ? Last more than a few minutes or can stop and recur. The pain tends to get worse with exercise or stress and to fade with rest.  Pain in the arms, neck, jaw, ear, or back.  Unexplained heartburn or indigestion.  Shortness of breath.  Nausea or vomiting.  Sudden light-headedness.  Sudden cold sweats.  Fluttering or fast heartbeat (palpitations). How is this diagnosed? This condition is diagnosed based on:  Your family and medical history.  A physical exam.  Tests, including: ? A test to check the electrical signals in your heart (electrocardiogram). ? Exercise stress test. This looks for signs of blockage when the heart is stressed with exercise, such as running on a treadmill. ? Pharmacologic stress test. This test looks for signs of blockage when the heart is being stressed with a medicine. ? Blood tests. ? Coronary angiogram. This is a procedure to look at the coronary arteries to see if there is any blockage. During this test, a dye is injected into your arteries so they appear on an X-ray. ? Coronary artery CT scan. This CT scan helps detect calcium deposits in your coronary arteries. Calcium deposits are an indicator of CAD. ? A test that uses sound waves to take a picture of your heart (echocardiogram). ? Chest X-ray. How is  this treated? This condition may be treated by:  Healthy lifestyle changes to reduce risk factors.  Medicines such as: ? Antiplatelet medicines and blood-thinning medicines, such as aspirin. These help to prevent blood clots. ? Nitroglycerin. ? Blood pressure medicines. ? Cholesterol-lowering medicine.  Coronary angioplasty and stenting. During this procedure, a thin, flexible tube is inserted through a blood vessel and into a blocked artery. A balloon or similar device on the end of the tube is inflated to open up the artery. In some cases, a  small, mesh tube (stent) is inserted into the artery to keep it open.  Coronary artery bypass surgery. During this surgery, veins or arteries from other parts of the body are used to create a bypass around the blockage and allow blood to reach your heart. Follow these instructions at home: Medicines  Take over-the-counter and prescription medicines only as told by your health care provider.  Do not take the following medicines unless your health care provider approves: ? NSAIDs, such as ibuprofen, naproxen, or celecoxib. ? Vitamin supplements that contain vitamin A, vitamin E, or both. Lifestyle  Follow an exercise program approved by your health care provider. Aim for 150 minutes of moderate exercise or 75 minutes of vigorous exercise each week.  Maintain a healthy weight or lose weight as approved by your health care provider.  Learn to manage stress or try to limit your stress. Ask your health care provider for suggestions if you need help.  Get screened for depression and seek treatment, if needed.  Do not use any products that contain nicotine or tobacco, such as cigarettes, e-cigarettes, and chewing tobacco. If you need help quitting, ask your health care provider.  Do not use illegal drugs. Eating and drinking  Follow a heart-healthy diet. A dietitian can help educate you about healthy food options and changes. In general, eat plenty  of fruits and vegetables, lean meats, and whole grains.  Avoid foods high in: ? Sugar. ? Salt (sodium). ? Saturated fat, such as processed or fatty meat. ? Trans fat, such as fried foods.  Use healthy cooking methods such as roasting, grilling, broiling, baking, poaching, steaming, or stir-frying.  Do not drink alcohol if your health care provider tells you not to drink.  If you drink alcohol: ? Limit how much you have to 0-2 drinks per day. ? Be aware of how much alcohol is in your drink. In the [place] one drink equals one 12 oz bottle of beer (355 mL), one 5 oz glass of wine (148 mL), or one 1 oz glass of hard liquor (44 mL).   General instructions  Manage any other health conditions, such as hypertension and diabetes. These conditions affect your heart.  Your health care provider may ask you to monitor your blood pressure. Ideally, your blood pressure should be below 130/80.  Keep all follow-up visits as told by your health care provider. This is important. Get help right away if:  You have pain in your chest, neck, ear, arm, jaw, stomach, or back that: ? Lasts more than a few minutes. ? Is recurring. ? Is not relieved by taking medicine under your tongue (sublingual nitroglycerin).  You have profuse sweating without cause.  You have unexplained: ? Heartburn or indigestion. ? Shortness of breath or difficulty breathing. ? Fluttering or fast heartbeat (palpitations). ? Nausea or vomiting. ? Fatigue. ? Feelings of nervousness or anxiety. ? Weakness. ? Diarrhea.  You have sudden light-headedness or dizziness.  You faint.  You feel like hurting yourself or think about taking your own life. These symptoms may represent a serious problem that is an emergency. Do not wait to see if the symptoms will go away. Get medical help right away. Call your local emergency services (911 in the [place] Do not drive yourself to the hospital. Summary  Coronary artery disease (CAD) is a  condition in which the arteries that lead to the heart (coronary arteries) become narrow or blocked. The narrowing or blockage can lead to a heart attack.  Many  people do not have any symptoms during the early stages of CAD.  CAD can be treated with lifestyle changes, medicines, surgery, or a combination of these treatments. This information is not intended to replace advice given to you by your health care provider. Make sure you discuss any questions you have with your health care provider. Document Revised: 12/11/2017 Document Reviewed: 12/01/2017 Elsevier Patient Education  2021 [employer].

## 2020-09-13 ENCOUNTER — Other Ambulatory Visit (INDEPENDENT_AMBULATORY_CARE_PROVIDER_SITE_OTHER): Payer: PPO

## 2020-09-13 DIAGNOSIS — F1090 Alcohol use, unspecified, uncomplicated: Secondary | ICD-10-CM

## 2020-09-13 DIAGNOSIS — Z7289 Other problems related to lifestyle: Secondary | ICD-10-CM | POA: Diagnosis not present

## 2020-09-13 DIAGNOSIS — I10 Essential (primary) hypertension: Secondary | ICD-10-CM | POA: Diagnosis not present

## 2020-09-13 DIAGNOSIS — I2510 Atherosclerotic heart disease of native coronary artery without angina pectoris: Secondary | ICD-10-CM | POA: Diagnosis not present

## 2020-09-13 DIAGNOSIS — I714 Abdominal aortic aneurysm, without rupture, unspecified: Secondary | ICD-10-CM

## 2020-09-13 DIAGNOSIS — I2583 Coronary atherosclerosis due to lipid rich plaque: Secondary | ICD-10-CM | POA: Diagnosis not present

## 2020-09-13 DIAGNOSIS — Z789 Other specified health status: Secondary | ICD-10-CM

## 2020-09-13 LAB — URINALYSIS, ROUTINE W REFLEX MICROSCOPIC
Bilirubin Urine: NEGATIVE
Ketones, ur: NEGATIVE
Leukocytes,Ua: NEGATIVE
Nitrite: NEGATIVE
Specific Gravity, Urine: 1.01 (ref 1.000–1.030)
Total Protein, Urine: NEGATIVE
Urine Glucose: NEGATIVE
Urobilinogen, UA: 0.2 (ref 0.0–1.0)
pH: 7.5 (ref 5.0–8.0)

## 2020-09-13 LAB — COMPREHENSIVE METABOLIC PANEL
ALT: 15 U/L (ref 0–53)
AST: 14 U/L (ref 0–37)
Albumin: 4.6 g/dL (ref 3.5–5.2)
Alkaline Phosphatase: 46 U/L (ref 39–117)
BUN: 15 mg/dL (ref 6–23)
CO2: 28 mEq/L (ref 19–32)
Calcium: 9.2 mg/dL (ref 8.4–10.5)
Chloride: 105 mEq/L (ref 96–112)
Creatinine, Ser: 0.99 mg/dL (ref 0.40–1.50)
GFR: 79.83 mL/min (ref >60.00)
Glucose, Bld: 104 mg/dL — ABNORMAL HIGH (ref 70–99)
Potassium: 4 mEq/L (ref 3.5–5.1)
Sodium: 140 mEq/L (ref 135–145)
Total Bilirubin: 0.7 mg/dL (ref 0.2–1.2)
Total Protein: 6.9 g/dL (ref 6.0–8.3)

## 2020-09-13 LAB — CBC
HCT: 40.2 % (ref 39.0–52.0)
Hemoglobin: 13.7 g/dL (ref 13.0–17.0)
MCHC: 34.1 g/dL (ref 30.0–36.0)
MCV: 87.6 fl (ref 78.0–100.0)
Platelets: 171 10*3/uL (ref 150.0–400.0)
RBC: 4.59 Mil/uL (ref 4.22–5.81)
RDW: 13.3 % (ref 11.5–15.5)
WBC: 4.6 10*3/uL (ref 4.0–10.5)

## 2020-09-13 LAB — LDL CHOLESTEROL, DIRECT: Direct LDL: 110 mg/dL

## 2020-09-13 LAB — LIPID PANEL
Cholesterol: 167 mg/dL (ref 0–200)
HDL: 41 mg/dL (ref >39.00)
LDL Cholesterol: 98 mg/dL (ref 0–99)
NonHDL: 126.08
Total CHOL/HDL Ratio: 4
Triglycerides: 138 mg/dL (ref 0.0–149.0)
VLDL: 27.6 mg/dL (ref 0.0–40.0)

## 2020-09-13 NOTE — Progress Notes (Signed)
Per orders of dr. Ethelene Hal pt is here for labs, pt tolerated draw well. Pt was able to leave adequate amount for urine sample.

## 2020-09-14 ENCOUNTER — Other Ambulatory Visit: Payer: PPO

## 2020-09-16 NOTE — Addendum Note (Signed)
Addended by: Jon Billings on: 09/16/2020 05:15 AM   Modules accepted: Orders

## 2020-09-17 DIAGNOSIS — G459 Transient cerebral ischemic attack, unspecified: Secondary | ICD-10-CM | POA: Insufficient documentation

## 2020-09-17 DIAGNOSIS — E785 Hyperlipidemia, unspecified: Secondary | ICD-10-CM | POA: Insufficient documentation

## 2020-09-17 MED ORDER — EZETIMIBE 10 MG PO TABS: 10.0000 mg | ORAL_TABLET | Freq: Every day | ORAL | 3 refills | Status: DC

## 2020-09-17 NOTE — Addendum Note (Signed)
Addended by: Jon Billings on: 09/17/2020 12:47 PM   Modules accepted: Orders

## 2020-10-11 ENCOUNTER — Other Ambulatory Visit: Payer: Self-pay

## 2020-10-11 ENCOUNTER — Encounter: Payer: Self-pay | Admitting: Cardiology

## 2020-10-11 ENCOUNTER — Ambulatory Visit (INDEPENDENT_AMBULATORY_CARE_PROVIDER_SITE_OTHER): Payer: PPO | Admitting: Cardiology

## 2020-10-11 VITALS — BP 116/70 | HR 56 | Ht 71.0 in | Wt 190.4 lb

## 2020-10-11 DIAGNOSIS — I10 Essential (primary) hypertension: Secondary | ICD-10-CM | POA: Diagnosis not present

## 2020-10-11 DIAGNOSIS — R5383 Other fatigue: Secondary | ICD-10-CM

## 2020-10-11 DIAGNOSIS — E782 Mixed hyperlipidemia: Secondary | ICD-10-CM | POA: Diagnosis not present

## 2020-10-11 DIAGNOSIS — I714 Abdominal aortic aneurysm, without rupture, unspecified: Secondary | ICD-10-CM

## 2020-10-11 DIAGNOSIS — I2510 Atherosclerotic heart disease of native coronary artery without angina pectoris: Secondary | ICD-10-CM

## 2020-10-11 DIAGNOSIS — E663 Overweight: Secondary | ICD-10-CM

## 2020-10-11 MED ORDER — ISOSORBIDE MONONITRATE ER 30 MG PO TB24: 15.0000 mg | ORAL_TABLET | Freq: Every day | ORAL | 3 refills | Status: DC

## 2020-10-11 MED ORDER — NITROGLYCERIN 0.4 MG SL SUBL: 0.4000 mg | SUBLINGUAL_TABLET | SUBLINGUAL | 3 refills | Status: AC | PRN

## 2020-10-11 NOTE — Progress Notes (Signed)
Cardiology Office Note:    Date:  10/11/2020   ID:  Marnette Burgess Ranney, DOB [birth year], MRN [id number]  PCP:  Libby Maw, MD  Cardiologist:  Berniece Salines, DO  Electrophysiologist:  None   Referring MD: Libby Maw   I have been having some chest pain which may not be nothing  History of Present Illness:    Patrick [name] is a 65 y.o. male with a hx of moderate coronary disease from a heart catheterization which was done in 2008 at which time he had nonobstructive three-vessel coronary artery disease, hypertension, hyperlipidemia, TIA is here today to establish cardiac care.  The patient tells me that he was encourage by his primary care doctor to reestablish care with cardiology as he has been experiencing intermittent chest discomfort.  He describes this as a midsternal burning sensation that he gets whenever he starts to exert himself.  He denies any shortness of breath.  But he notes that he can almost always be certain that once he started exerting himself he would have these feelings.  He has not had any radiation to the pain is mostly midsternal.  Quantify this about a 5 out of 10.  He denies any shortness of breath.  But admit to fatigue.  Of note he also tells me that he had some urological work done recently and he was told by the urologist that he has abdominal aortic aneurysm.  He is concerned about this because his grandfather and both his father had an aneurysm.  Past Medical History:  Diagnosis Date   Coronary artery disease    moderate   Hyperlipidemia    TIA (transient ischemic attack)     Past Surgical History:  Procedure Laterality Date   MANDIBULAR HARDWARE REMOVAL N/A 02/21/2016   Procedure: MANDIBULAR HARDWARE REMOVAL;  Surgeon: Wallace Going, DO;  Location: [facility];  Service: Plastics;  Laterality: N/A;   ORIF MANDIBULAR FRACTURE N/A 01/07/2016   Procedure: OPEN REDUCTION INTERNAL FIXATION (ORIF) MANDIBULAR  FRACTURE;  Surgeon: Loel Lofty Dillingham, DO;  Location: [facility];  Service: Plastics;  Laterality: N/A;   PLACEMENT OF LUMBAR DRAIN N/A 01/14/2016   Procedure: PLACEMENT OF LUMBAR DRAIN;  Surgeon: Newman Pies, MD;  Location: [facility];  Service: Neurosurgery;  Laterality: N/A;    Current Medications: Current Meds  Medication Sig   amLODipine (NORVASC) 5 MG tablet Take by mouth.   Cholecalciferol (D3 2000 PO) Take 1 tablet by mouth daily.   Coenzyme Q10 (CO Q-10) 300 MG CAPS Take 1 capsule by mouth daily.   ezetimibe (ZETIA) 10 MG tablet Take 1 tablet (10 mg total) by mouth daily.   finasteride (PROSCAR) 5 MG tablet Take 5 mg by mouth daily.   isosorbide mononitrate (IMDUR) 30 MG 24 hr tablet Take 0.5 tablets (15 mg total) by mouth daily.   Multiple Vitamins-Minerals (ONE-A-DAY MENS, MINERALS, PO) Take 1 tablet by mouth daily.   nitroGLYCERIN (NITROSTAT) 0.4 MG SL tablet Place 1 tablet (0.4 mg total) under the tongue every 5 (five) minutes as needed for chest pain.   rosuvastatin (CRESTOR) 40 MG tablet Take by mouth.   tamsulosin (FLOMAX) 0.4 MG CAPS capsule Take 1 capsule (0.4 mg total) by mouth daily. (Patient taking differently: Take 0.4 mg by mouth daily. Take 2 capsules daily)     Allergies:   Acetaminophen   Social History   Socioeconomic History   Marital status: Married    Spouse name: Not on file  Number of children: Not on file   Years of education: Not on file   Highest education level: Not on file  Occupational History   Not on file  Tobacco Use   Smoking status: Former    Pack years: 0.00    Types: Cigarettes    Quit date: 01/06/1995    Years since quitting: 25.7   Smokeless tobacco: Never  Substance and Sexual Activity   Alcohol use: Yes   Drug use: No   Sexual activity: Not on file  Other Topics Concern   Not on file  Social History Narrative   Not on file   Social Determinants of Health   Financial Resource Strain: Not on file  Food Insecurity: Not on  file  Transportation Needs: Not on file  Physical Activity: Not on file  Stress: Not on file  Social Connections: Not on file     Family History: The patient's family history includes Cancer in his mother; Heart disease in his father.  ROS:   Review of Systems  Constitution: Negative for decreased appetite, fever and weight gain.  HENT: Negative for congestion, ear discharge, hoarse voice and sore throat.   Eyes: Negative for discharge, redness, vision loss in right eye and visual halos.  Cardiovascular: Reports chest pain,.  Negative for dyspnea on exertion, leg swelling, orthopnea and palpitations.  Respiratory: Negative for cough, hemoptysis, shortness of breath and snoring.   Endocrine: Negative for heat intolerance and polyphagia.  Hematologic/Lymphatic: Negative for bleeding problem. Does not bruise/bleed easily.  Skin: Negative for flushing, nail changes, rash and suspicious lesions.  Musculoskeletal: Negative for arthritis, joint pain, muscle cramps, myalgias, neck pain and stiffness.  Gastrointestinal: Negative for abdominal pain, bowel incontinence, diarrhea and excessive appetite.  Genitourinary: Negative for decreased libido, genital sores and incomplete emptying.  Neurological: Negative for brief paralysis, focal weakness, headaches and loss of balance.  Psychiatric/Behavioral: Negative for altered mental status, depression and suicidal ideas.  Allergic/Immunologic: Negative for HIV exposure and persistent infections.    EKGs/Labs/Other Studies Reviewed:    The following studies were reviewed today:   EKG:  The ekg ordered today demonstrates sinus bradycardia, heart rate 56 bpm  Recent Labs: 09/13/2020: ALT 15; BUN 15; Creatinine, Ser 0.99; Hemoglobin 13.7; Platelets 171.0; Potassium 4.0; Sodium 140  Recent Lipid Panel    Component Value Date/Time   CHOL 167 09/13/2020 1008   TRIG 138.0 09/13/2020 1008   HDL 41.00 09/13/2020 1008   CHOLHDL 4 09/13/2020 1008    VLDL 27.6 09/13/2020 1008   LDLCALC 98 09/13/2020 1008   LDLDIRECT 110.0 09/13/2020 1008    Physical Exam:    VS:  BP 116/70 (BP Location: Left Arm, Patient Position: Sitting)   Pulse (!) 56   Ht 5\' 11"  (1.803 m)   Wt 190 lb 6.4 oz (86.4 kg)   SpO2 97%   BMI 26.56 kg/m     Wt Readings from Last 3 Encounters:  10/11/20 190 lb 6.4 oz (86.4 kg)  09/12/20 190 lb 12.8 oz (86.5 kg)  10/21/19 187 lb 11.2 oz (85.1 kg)     GEN: Well nourished, well developed in no acute distress HEENT: Normal NECK: No JVD; No carotid bruits LYMPHATICS: No lymphadenopathy CARDIAC: S1S2 noted,RRR, no murmurs, rubs, gallops RESPIRATORY:  Clear to auscultation without rales, wheezing or rhonchi  ABDOMEN: Soft, non-tender, non-distended, +bowel sounds, no guarding. EXTREMITIES: No edema, No cyanosis, no clubbing MUSCULOSKELETAL:  No deformity  SKIN: Warm and dry NEUROLOGIC:  Alert and oriented x 3,  non-focal PSYCHIATRIC:  Normal affect, good insight  ASSESSMENT:    1. Hypertension, unspecified type   2. Coronary artery disease involving native coronary artery of native heart without angina pectoris   3. Other fatigue   4. Mixed hyperlipidemia   5. Overweight   6. AAA (abdominal aortic aneurysm) without rupture (HCC)    PLAN:    I am concerned about his symptoms and given his history of nonobstructive moderate coronary artery disease I like to proceed with a left heart catheterization in this patient.  I talked to the patient about this testing and he understands that risks include but are not limited to stroke (1 in 1000), death (1 in 20), kidney failure [usually temporary] (1 in 500), bleeding (1 in 200), allergic reaction [possibly serious] (1 in 200), and agrees to proceed.  In the meantime I am going to start him on Imdur 50 mg daily to help with his symptoms.  Given the report of abdominal aortic aneurysm I like to get a abdominal ultrasound to assess, in addition he is a former smoker in at  age 83 she will get a screening AAA testing anyway.  His generalized fatigue we will get an echocardiogram to assess LVEF as well as RV function and any other structural abnormalities.  Blood pressure is acceptable, continue with current antihypertensive regimen.  I reviewed his most recent lipid profile which was done in June 2022 showing HDL 41, LDL 98, total cholesterol 167, triglyceride 138 he was recently started on Zetia 10 mg in addition to his Crestor after his blood work.  His target is less than 70.  We will repeat this in [redacted] weeks along with LP(a) to determine the need for any benefit of PCSK9 inhibitors.  The patient is in agreement with the above plan. The patient left the office in stable condition.  The patient will follow up in 4 to 6 weeks post cath   Medication Adjustments/Labs and Tests Ordered: Current medicines are reviewed at length with the patient today.  Concerns regarding medicines are outlined above.  Orders Placed This Encounter  Procedures   Basic metabolic panel   CBC with Differential/Platelet   Magnesium   Lipoprotein A (LPA)   VITAMIN D 25 Hydroxy (Vit-D Deficiency, Fractures)   EKG 12-Lead   ECHOCARDIOGRAM COMPLETE   VAS [place] AAA DUPLEX    Meds ordered this encounter  Medications   isosorbide mononitrate (IMDUR) 30 MG 24 hr tablet    Sig: Take 0.5 tablets (15 mg total) by mouth daily.    Dispense:  45 tablet    Refill:  3   nitroGLYCERIN (NITROSTAT) 0.4 MG SL tablet    Sig: Place 1 tablet (0.4 mg total) under the tongue every 5 (five) minutes as needed for chest pain.    Dispense:  90 tablet    Refill:  3     Patient Instructions  Medication Instructions:  Your physician has recommended you make the following change in your medication:  START: Imdur 15 mg once daily START: Nitroglycerin 0.4 mg take one tablet by mouth every 5 minutes up to three times as needed for chest pain.  *If you need a refill on your cardiac medications before your  next appointment, please call your pharmacy*   Lab Work: Your physician recommends that you return for lab work in:  Week of Jul 18-22 : Lpa,Vitamin D, BMET, Mag, CBC  If you have labs (blood work) drawn today and your tests are completely normal, you  will receive your results only by: [facility] (if you have MyChart) OR A paper copy in the mail If you have any lab test that is abnormal or we need to change your treatment, we will call you to review the results.   Testing/Procedures: Your physician has requested that you have an echocardiogram. Echocardiography is a painless test that uses sound waves to create images of your heart. It provides your doctor with information about the size and shape of your heart and how well your heart's chambers and valves are working. This procedure takes approximately one hour. There are no restrictions for this procedure.     [place] [address] [place], [place] [address] Dept: [phone] Loc: [phone]  Trayden Brandy Coxe  10/11/2020  You are scheduled for a Cardiac Catheterization on Friday, July 29 with Dr. Sherren Mocha.  1. Please arrive at the [facility] (Main Entrance A) at [facility]: [address] [place], [place] [address] at 7:00 AM (This time is two hours before your procedure to ensure your preparation). Free valet parking service is available.   Special note: Every effort is made to have your procedure done on time. Please understand that emergencies sometimes delay scheduled procedures.  2. Diet: Do not eat solid foods after midnight.  The patient may have clear liquids until 5am upon the day of the procedure.  3. Labs: You will need to have blood drawn on week of July 18-22  4. Medication instructions in preparation for your procedure:   Contrast Allergy: No   On the morning of your procedure, take your  Aspirin and any morning medicines NOT listed above.  You may use sips of water.  5. Plan for one night stay--bring personal belongings. 6. Bring a current list of your medications and current insurance cards. 7. You MUST have a responsible person to drive you home. 8. Someone MUST be with you the first 24 hours after you arrive home or your discharge will be delayed. 9. Please wear clothes that are easy to get on and off and wear slip-on shoes.  Thank you for allowing [place] to care for you!   -- [facility]    Follow-Up: At [facility], you and your health needs are our priority.  As part of our continuing mission to provide you with exceptional heart care, we have created designated Provider Care Teams.  These Care Teams include your primary Cardiologist (physician) and Advanced Practice Providers (APPs -  Physician Assistants and Nurse Practitioners) who all work together to provide you with the care you need, when you need it.  We recommend signing up for the patient portal called "MyChart".  Sign up information is provided on this After Visit Summary.  MyChart is used to connect with patients for Virtual Visits (Telemedicine).  Patients are able to view lab/test results, encounter notes, upcoming appointments, etc.  Non-urgent messages can be sent to your provider as well.   To learn more about what you can do with MyChart, go to [email or web address].    Your next appointment:   6 week(s) post cath  The format for your next appointment:   In Person    Other Instructions Abdominal or Pelvic Ultrasound An ultrasound is a test that uses sound waves to take pictures of the inside of the body. This is a safe and painless test that does not expose you to any  X-rays. It is done using a handheld device (transducer) that is placed on your abdomen or pelvis and moved around. The transducer sends out sound waves that reflect off your tissues and organs to  create imageson a computer screen. An abdominal ultrasound takes pictures of the inside of the abdomen. A pelvic ultrasound takes pictures of the inside of the pelvis. An abdominal or pelvic ultrasound may be done to: Check the shape or size of an organ. Check for problems such as: Cysts. Masses. Inflammation. Kidney stones. Gallstones. Tell a health care provider about: Any allergies you have. All medicines you are taking, including vitamins, herbs, eye drops, creams, and over-the-counter medicines. Any surgeries you have had. Any medical conditions you have. Whether you are pregnant or may be pregnant. What are the risks? There are no known risks or complications from having this test. What happens before the procedure? Follow instructions from your health care provider about eating or drinking before the test. Wear clothing that is easily washable in case the gel used for the test gets on your clothes. What happens during the procedure?  You will lie on an exam table. Your lower abdomen and pelvis will be exposed. A gel will be applied to your skin. It may feel cool. The transducer will be pressed on your abdomen or pelvis and moved back and forth, through the gel, over the area to be examined. The transducer will take pictures. These will be displayed on one or more computer monitors that look like small TV screens. You may be asked to change your position. After the exam, the gel will be cleaned off. What happens after the procedure? It is up to you to get your test results. Ask your health care provider, or the department that is doing the test, when your results will be ready. Keep all follow-up visits as told by your health care provider. This is important. Summary An ultrasound is a test that uses sound waves to take pictures of the inside of the body. An abdominal or pelvic ultrasound may be done to check for cysts, masses, inflammation, kidney stones, or  gallstones. During the procedure, a handheld device (transducer) will be placed on your abdomen or pelvis and moved around over the area to be examined. Ask your health care provider, or the department that is doing the test, when your results will be ready. This information is not intended to replace advice given to you by your health care provider. Make sure you discuss any questions you have with your healthcare provider. Document Revised: 10/16/2017 Document Reviewed: 10/19/2017 Elsevier Patient Education  [place].  Echocardiogram An echocardiogram is a test that uses sound waves (ultrasound) to produce images of the heart. Images from an echocardiogram can provide important information about: Heart size and shape. The size and thickness and movement of your heart's walls. Heart muscle function and strength. Heart valve function or if you have stenosis. Stenosis is when the heart valves are too narrow. If blood is flowing backward through the heart valves (regurgitation). A tumor or infectious growth around the heart valves. Areas of heart muscle that are not working well because of poor blood flow or injury from a heart attack. Aneurysm detection. An aneurysm is a weak or damaged part of an artery wall. The wall bulges out from the normal force of blood pumping through the body. Tell a health care provider about: Any allergies you have. All medicines you are taking, including vitamins, herbs, eye drops, creams,  and over-the-counter medicines. Any blood disorders you have. Any surgeries you have had. Any medical conditions you have. Whether you are pregnant or may be pregnant. What are the risks? Generally, this is a safe test. However, problems may occur, including an allergic reaction to dye (contrast) that may be used during the test. What happens before the test? No specific preparation is needed. You may eat and drink normally. What happens during the test?  You  will take off your clothes from the waist up and put on a hospital gown. Electrodes or electrocardiogram (ECG)patches may be placed on your chest. The electrodes or patches are then connected to a device that monitors your heart rate and rhythm. You will lie down on a table for an ultrasound exam. A gel will be applied to your chest to help sound waves pass through your skin. A handheld device, called a transducer, will be pressed against your chest and moved over your heart. The transducer produces sound waves that travel to your heart and bounce back (or "echo" back) to the transducer. These sound waves will be captured in real-time and changed into images of your heart that can be viewed on a video monitor. The images will be recorded on a computer and reviewed by your health care provider. You may be asked to change positions or hold your breath for a short time. This makes it easier to get different views or better views of your heart. In some cases, you may receive contrast through an IV in one of your veins. This can improve the quality of the pictures from your heart. The procedure may vary among health care providers and hospitals. What can I expect after the test? You may return to your normal, everyday life, including diet, activities, andmedicines, unless your health care provider tells you not to do that. Follow these instructions at home: It is up to you to get the results of your test. Ask your health care provider, or the department that is doing the test, when your results will be ready. Keep all follow-up visits. This is important. Summary An echocardiogram is a test that uses sound waves (ultrasound) to produce images of the heart. Images from an echocardiogram can provide important information about the size and shape of your heart, heart muscle function, heart valve function, and other possible heart problems. You do not need to do anything to prepare before this test. You may eat  and drink normally. After the echocardiogram is completed, you may return to your normal, everyday life, unless your health care provider tells you not to do that. This information is not intended to replace advice given to you by your health care provider. Make sure you discuss any questions you have with your healthcare provider. Document Revised: 11/15/2019 Document Reviewed: 11/15/2019 Elsevier Patient Education  2022 [place].    Adopting a Healthy Lifestyle.  Know what a healthy weight is for you (roughly BMI <25) and aim to maintain this   Aim for 7+ servings of fruits and vegetables daily   65-80+ fluid ounces of water or unsweet tea for healthy kidneys   Limit to max 1 drink of alcohol per day; avoid smoking/tobacco   Limit animal fats in diet for cholesterol and heart health - choose grass fed whenever available   Avoid highly processed foods, and foods high in saturated/trans fats   Aim for low stress - take time to unwind and care for your mental health   Aim for 150  min of moderate intensity exercise weekly for heart health, and weights twice weekly for bone health   Aim for 7-9 hours of sleep daily   When it comes to diets, agreement about the perfect plan isnt easy to find, even among the experts. Experts at the [employer] developed an idea known as the Healthy Eating Plate. Just imagine a plate divided into logical, healthy portions.   The emphasis is on diet quality:   Load up on vegetables and fruits - one-half of your plate: Aim for color and variety, and remember that potatoes dont count.   Go for whole grains - one-quarter of your plate: Whole wheat, barley, wheat berries, quinoa, oats, brown rice, and foods made with them. If you want pasta, go with whole wheat pasta.   Protein power - one-quarter of your plate: Fish, chicken, beans, and nuts are all healthy, versatile protein sources. Limit red meat.   The diet, however, does go  beyond the plate, offering a few other suggestions.   Use healthy plant oils, such as olive, canola, soy, corn, sunflower and peanut. Check the labels, and avoid partially hydrogenated oil, which have unhealthy trans fats.   If youre thirsty, drink water. Coffee and tea are good in moderation, but skip sugary drinks and limit milk and dairy products to one or two daily servings.   The type of carbohydrate in the diet is more important than the amount. Some sources of carbohydrates, such as vegetables, fruits, whole grains, and beans-are healthier than others.   Finally, stay active  Signed, Berniece Salines, DO  10/11/2020 1:18 PM    [facility]

## 2020-10-11 NOTE — H&P (View-Only) (Signed)
Cardiology Office Note:    Date:  10/11/2020   ID:  Patrick [name], DOB 1956/02/17, MRN [id number]  PCP:  Libby Maw, MD  Cardiologist:  Berniece Salines, DO  Electrophysiologist:  None   Referring MD: Libby Maw   I have been having some chest pain which may not be nothing  History of Present Illness:    Patrick [name] is a 65 y.o. male with a hx of moderate coronary disease from a heart catheterization which was done in 2008 at which time he had nonobstructive three-vessel coronary artery disease, hypertension, hyperlipidemia, TIA is here today to establish cardiac care.  The patient tells me that he was encourage by his primary care doctor to reestablish care with cardiology as he has been experiencing intermittent chest discomfort.  He describes this as a midsternal burning sensation that he gets whenever he starts to exert himself.  He denies any shortness of breath.  But he notes that he can almost always be certain that once he started exerting himself he would have these feelings.  He has not had any radiation to the pain is mostly midsternal.  Quantify this about a 5 out of 10.  He denies any shortness of breath.  But admit to fatigue.  Of note he also tells me that he had some urological work done recently and he was told by the urologist that he has abdominal aortic aneurysm.  He is concerned about this because his grandfather and both his father had an aneurysm.  Past Medical History:  Diagnosis Date   Coronary artery disease    moderate   Hyperlipidemia    TIA (transient ischemic attack)     Past Surgical History:  Procedure Laterality Date   MANDIBULAR HARDWARE REMOVAL N/A 02/21/2016   Procedure: MANDIBULAR HARDWARE REMOVAL;  Surgeon: Wallace Going, DO;  Location: [facility];  Service: Plastics;  Laterality: N/A;   ORIF MANDIBULAR FRACTURE N/A 01/07/2016   Procedure: OPEN REDUCTION INTERNAL FIXATION (ORIF) MANDIBULAR  FRACTURE;  Surgeon: Loel Lofty Dillingham, DO;  Location: [facility];  Service: Plastics;  Laterality: N/A;   PLACEMENT OF LUMBAR DRAIN N/A 01/14/2016   Procedure: PLACEMENT OF LUMBAR DRAIN;  Surgeon: Newman Pies, MD;  Location: [facility];  Service: Neurosurgery;  Laterality: N/A;    Current Medications: Current Meds  Medication Sig   amLODipine (NORVASC) 5 MG tablet Take by mouth.   Cholecalciferol (D3 2000 PO) Take 1 tablet by mouth daily.   Coenzyme Q10 (CO Q-10) 300 MG CAPS Take 1 capsule by mouth daily.   ezetimibe (ZETIA) 10 MG tablet Take 1 tablet (10 mg total) by mouth daily.   finasteride (PROSCAR) 5 MG tablet Take 5 mg by mouth daily.   isosorbide mononitrate (IMDUR) 30 MG 24 hr tablet Take 0.5 tablets (15 mg total) by mouth daily.   Multiple Vitamins-Minerals (ONE-A-DAY MENS, MINERALS, PO) Take 1 tablet by mouth daily.   nitroGLYCERIN (NITROSTAT) 0.4 MG SL tablet Place 1 tablet (0.4 mg total) under the tongue every 5 (five) minutes as needed for chest pain.   rosuvastatin (CRESTOR) 40 MG tablet Take by mouth.   tamsulosin (FLOMAX) 0.4 MG CAPS capsule Take 1 capsule (0.4 mg total) by mouth daily. (Patient taking differently: Take 0.4 mg by mouth daily. Take 2 capsules daily)     Allergies:   Acetaminophen   Social History   Socioeconomic History   Marital status: Married    Spouse name: Not on file  Number of children: Not on file   Years of education: Not on file   Highest education level: Not on file  Occupational History   Not on file  Tobacco Use   Smoking status: Former    Pack years: 0.00    Types: Cigarettes    Quit date: 01/06/1995    Years since quitting: 25.7   Smokeless tobacco: Never  Substance and Sexual Activity   Alcohol use: Yes   Drug use: No   Sexual activity: Not on file  Other Topics Concern   Not on file  Social History Narrative   Not on file   Social Determinants of Health   Financial Resource Strain: Not on file  Food Insecurity: Not on  file  Transportation Needs: Not on file  Physical Activity: Not on file  Stress: Not on file  Social Connections: Not on file     Family History: The patient's family history includes Cancer in his mother; Heart disease in his father.  ROS:   Review of Systems  Constitution: Negative for decreased appetite, fever and weight gain.  HENT: Negative for congestion, ear discharge, hoarse voice and sore throat.   Eyes: Negative for discharge, redness, vision loss in right eye and visual halos.  Cardiovascular: Reports chest pain,.  Negative for dyspnea on exertion, leg swelling, orthopnea and palpitations.  Respiratory: Negative for cough, hemoptysis, shortness of breath and snoring.   Endocrine: Negative for heat intolerance and polyphagia.  Hematologic/Lymphatic: Negative for bleeding problem. Does not bruise/bleed easily.  Skin: Negative for flushing, nail changes, rash and suspicious lesions.  Musculoskeletal: Negative for arthritis, joint pain, muscle cramps, myalgias, neck pain and stiffness.  Gastrointestinal: Negative for abdominal pain, bowel incontinence, diarrhea and excessive appetite.  Genitourinary: Negative for decreased libido, genital sores and incomplete emptying.  Neurological: Negative for brief paralysis, focal weakness, headaches and loss of balance.  Psychiatric/Behavioral: Negative for altered mental status, depression and suicidal ideas.  Allergic/Immunologic: Negative for HIV exposure and persistent infections.    EKGs/Labs/Other Studies Reviewed:    The following studies were reviewed today:   EKG:  The ekg ordered today demonstrates sinus bradycardia, heart rate 56 bpm  Recent Labs: 09/13/2020: ALT 15; BUN 15; Creatinine, Ser 0.99; Hemoglobin 13.7; Platelets 171.0; Potassium 4.0; Sodium 140  Recent Lipid Panel    Component Value Date/Time   CHOL 167 09/13/2020 1008   TRIG 138.0 09/13/2020 1008   HDL 41.00 09/13/2020 1008   CHOLHDL 4 09/13/2020 1008    VLDL 27.6 09/13/2020 1008   LDLCALC 98 09/13/2020 1008   LDLDIRECT 110.0 09/13/2020 1008    Physical Exam:    VS:  BP 116/70 (BP Location: Left Arm, Patient Position: Sitting)   Pulse (!) 56   Ht 5\' 11"  (1.803 m)   Wt 190 lb 6.4 oz (86.4 kg)   SpO2 97%   BMI 26.56 kg/m     Wt Readings from Last 3 Encounters:  10/11/20 190 lb 6.4 oz (86.4 kg)  09/12/20 190 lb 12.8 oz (86.5 kg)  10/21/19 187 lb 11.2 oz (85.1 kg)     GEN: Well nourished, well developed in no acute distress HEENT: Normal NECK: No JVD; No carotid bruits LYMPHATICS: No lymphadenopathy CARDIAC: S1S2 noted,RRR, no murmurs, rubs, gallops RESPIRATORY:  Clear to auscultation without rales, wheezing or rhonchi  ABDOMEN: Soft, non-tender, non-distended, +bowel sounds, no guarding. EXTREMITIES: No edema, No cyanosis, no clubbing MUSCULOSKELETAL:  No deformity  SKIN: Warm and dry NEUROLOGIC:  Alert and oriented x 3,  non-focal PSYCHIATRIC:  Normal affect, good insight  ASSESSMENT:    1. Hypertension, unspecified type   2. Coronary artery disease involving native coronary artery of native heart without angina pectoris   3. Other fatigue   4. Mixed hyperlipidemia   5. Overweight   6. AAA (abdominal aortic aneurysm) without rupture (HCC)    PLAN:    I am concerned about his symptoms and given his history of nonobstructive moderate coronary artery disease I like to proceed with a left heart catheterization in this patient.  I talked to the patient about this testing and he understands that risks include but are not limited to stroke (1 in 1000), death (1 in 15), kidney failure [usually temporary] (1 in 500), bleeding (1 in 200), allergic reaction [possibly serious] (1 in 200), and agrees to proceed.  In the meantime I am going to start him on Imdur 50 mg daily to help with his symptoms.  Given the report of abdominal aortic aneurysm I like to get a abdominal ultrasound to assess, in addition he is a former smoker in at  age 65 she will get a screening AAA testing anyway.  His generalized fatigue we will get an echocardiogram to assess LVEF as well as RV function and any other structural abnormalities.  Blood pressure is acceptable, continue with current antihypertensive regimen.  I reviewed his most recent lipid profile which was done in June 2022 showing HDL 41, LDL 98, total cholesterol 167, triglyceride 138 he was recently started on Zetia 10 mg in addition to his Crestor after his blood work.  His target is less than 70.  We will repeat this in [redacted] weeks along with LP(a) to determine the need for any benefit of PCSK9 inhibitors.  The patient is in agreement with the above plan. The patient left the office in stable condition.  The patient will follow up in 4 to 6 weeks post cath   Medication Adjustments/Labs and Tests Ordered: Current medicines are reviewed at length with the patient today.  Concerns regarding medicines are outlined above.  Orders Placed This Encounter  Procedures   Basic metabolic panel   CBC with Differential/Platelet   Magnesium   Lipoprotein A (LPA)   VITAMIN D 25 Hydroxy (Vit-D Deficiency, Fractures)   EKG 12-Lead   ECHOCARDIOGRAM COMPLETE   VAS [place] AAA DUPLEX    Meds ordered this encounter  Medications   isosorbide mononitrate (IMDUR) 30 MG 24 hr tablet    Sig: Take 0.5 tablets (15 mg total) by mouth daily.    Dispense:  45 tablet    Refill:  3   nitroGLYCERIN (NITROSTAT) 0.4 MG SL tablet    Sig: Place 1 tablet (0.4 mg total) under the tongue every 5 (five) minutes as needed for chest pain.    Dispense:  90 tablet    Refill:  3     Patient Instructions  Medication Instructions:  Your physician has recommended you make the following change in your medication:  START: Imdur 15 mg once daily START: Nitroglycerin 0.4 mg take one tablet by mouth every 5 minutes up to three times as needed for chest pain.  *If you need a refill on your cardiac medications before your  next appointment, please call your pharmacy*   Lab Work: Your physician recommends that you return for lab work in:  Week of Jul 18-22 : Lpa,Vitamin D, BMET, Mag, CBC  If you have labs (blood work) drawn today and your tests are completely normal, you  will receive your results only by: [place] (if you have MyChart) OR A paper copy in the mail If you have any lab test that is abnormal or we need to change your treatment, we will call you to review the results.   Testing/Procedures: Your physician has requested that you have an echocardiogram. Echocardiography is a painless test that uses sound waves to create images of your heart. It provides your doctor with information about the size and shape of your heart and how well your heart's chambers and valves are working. This procedure takes approximately one hour. There are no restrictions for this procedure.     [facility] [place], [place] [address] Dept: [phone] Loc: [phone]  [facility] [place]  10/11/2020  You are scheduled for a Cardiac Catheterization on Friday, July 29 with Dr. Sherren Mocha.  1. Please arrive at the [facility] (Main Entrance A) at [facility]: [address] [place], [place] [address] at 7:00 AM (This time is two hours before your procedure to ensure your preparation). Free valet parking service is available.   Special note: Every effort is made to have your procedure done on time. Please understand that emergencies sometimes delay scheduled procedures.  2. Diet: Do not eat solid foods after midnight.  The patient may have clear liquids until 5am upon the day of the procedure.  3. Labs: You will need to have blood drawn on week of July 18-22  4. Medication instructions in preparation for your procedure:   Contrast Allergy: No   On the morning of your procedure, take your  Aspirin and any morning medicines NOT listed above.  You may use sips of water.  5. Plan for one night stay--bring personal belongings. 6. Bring a current list of your medications and current insurance cards. 7. You MUST have a responsible person to drive you home. 8. Someone MUST be with you the first 24 hours after you arrive home or your discharge will be delayed. 9. Please wear clothes that are easy to get on and off and wear slip-on shoes.  Thank you for allowing [place] to care for you!   -- [facility]    Follow-Up: At [facility], you and your health needs are our priority.  As part of our continuing mission to provide you with exceptional heart care, we have created designated Provider Care Teams.  These Care Teams include your primary Cardiologist (physician) and Advanced Practice Providers (APPs -  Physician Assistants and Nurse Practitioners) who all work together to provide you with the care you need, when you need it.  We recommend signing up for the patient portal called "MyChart".  Sign up information is provided on this After Visit Summary.  MyChart is used to connect with patients for Virtual Visits (Telemedicine).  Patients are able to view lab/test results, encounter notes, upcoming appointments, etc.  Non-urgent messages can be sent to your provider as well.   To learn more about what you can do with MyChart, go to [email or web address].    Your next appointment:   6 week(s) post cath  The format for your next appointment:   In Person    Other Instructions Abdominal or Pelvic Ultrasound An ultrasound is a test that uses sound waves to take pictures of the inside of the body. This is a safe and painless test that does not expose you to any  X-rays. It is done using a handheld device (transducer) that is placed on your abdomen or pelvis and moved around. The transducer sends out sound waves that reflect off your tissues and organs to  create imageson a computer screen. An abdominal ultrasound takes pictures of the inside of the abdomen. A pelvic ultrasound takes pictures of the inside of the pelvis. An abdominal or pelvic ultrasound may be done to: Check the shape or size of an organ. Check for problems such as: Cysts. Masses. Inflammation. Kidney stones. Gallstones. Tell a health care provider about: Any allergies you have. All medicines you are taking, including vitamins, herbs, eye drops, creams, and over-the-counter medicines. Any surgeries you have had. Any medical conditions you have. Whether you are pregnant or may be pregnant. What are the risks? There are no known risks or complications from having this test. What happens before the procedure? Follow instructions from your health care provider about eating or drinking before the test. Wear clothing that is easily washable in case the gel used for the test gets on your clothes. What happens during the procedure?  You will lie on an exam table. Your lower abdomen and pelvis will be exposed. A gel will be applied to your skin. It may feel cool. The transducer will be pressed on your abdomen or pelvis and moved back and forth, through the gel, over the area to be examined. The transducer will take pictures. These will be displayed on one or more computer monitors that look like small TV screens. You may be asked to change your position. After the exam, the gel will be cleaned off. What happens after the procedure? It is up to you to get your test results. Ask your health care provider, or the department that is doing the test, when your results will be ready. Keep all follow-up visits as told by your health care provider. This is important. Summary An ultrasound is a test that uses sound waves to take pictures of the inside of the body. An abdominal or pelvic ultrasound may be done to check for cysts, masses, inflammation, kidney stones, or  gallstones. During the procedure, a handheld device (transducer) will be placed on your abdomen or pelvis and moved around over the area to be examined. Ask your health care provider, or the department that is doing the test, when your results will be ready. This information is not intended to replace advice given to you by your health care provider. Make sure you discuss any questions you have with your healthcare provider. Document Revised: 10/16/2017 Document Reviewed: 10/19/2017 Elsevier Patient Education  [place].  Echocardiogram An echocardiogram is a test that uses sound waves (ultrasound) to produce images of the heart. Images from an echocardiogram can provide important information about: Heart size and shape. The size and thickness and movement of your heart's walls. Heart muscle function and strength. Heart valve function or if you have stenosis. Stenosis is when the heart valves are too narrow. If blood is flowing backward through the heart valves (regurgitation). A tumor or infectious growth around the heart valves. Areas of heart muscle that are not working well because of poor blood flow or injury from a heart attack. Aneurysm detection. An aneurysm is a weak or damaged part of an artery wall. The wall bulges out from the normal force of blood pumping through the body. Tell a health care provider about: Any allergies you have. All medicines you are taking, including vitamins, herbs, eye drops, creams,  and over-the-counter medicines. Any blood disorders you have. Any surgeries you have had. Any medical conditions you have. Whether you are pregnant or may be pregnant. What are the risks? Generally, this is a safe test. However, problems may occur, including an allergic reaction to dye (contrast) that may be used during the test. What happens before the test? No specific preparation is needed. You may eat and drink normally. What happens during the test?  You  will take off your clothes from the waist up and put on a hospital gown. Electrodes or electrocardiogram (ECG)patches may be placed on your chest. The electrodes or patches are then connected to a device that monitors your heart rate and rhythm. You will lie down on a table for an ultrasound exam. A gel will be applied to your chest to help sound waves pass through your skin. A handheld device, called a transducer, will be pressed against your chest and moved over your heart. The transducer produces sound waves that travel to your heart and bounce back (or "echo" back) to the transducer. These sound waves will be captured in real-time and changed into images of your heart that can be viewed on a video monitor. The images will be recorded on a computer and reviewed by your health care provider. You may be asked to change positions or hold your breath for a short time. This makes it easier to get different views or better views of your heart. In some cases, you may receive contrast through an IV in one of your veins. This can improve the quality of the pictures from your heart. The procedure may vary among health care providers and hospitals. What can I expect after the test? You may return to your normal, everyday life, including diet, activities, andmedicines, unless your health care provider tells you not to do that. Follow these instructions at home: It is up to you to get the results of your test. Ask your health care provider, or the department that is doing the test, when your results will be ready. Keep all follow-up visits. This is important. Summary An echocardiogram is a test that uses sound waves (ultrasound) to produce images of the heart. Images from an echocardiogram can provide important information about the size and shape of your heart, heart muscle function, heart valve function, and other possible heart problems. You do not need to do anything to prepare before this test. You may eat  and drink normally. After the echocardiogram is completed, you may return to your normal, everyday life, unless your health care provider tells you not to do that. This information is not intended to replace advice given to you by your health care provider. Make sure you discuss any questions you have with your healthcare provider. Document Revised: 11/15/2019 Document Reviewed: 11/15/2019 Elsevier Patient Education  2022 [place].    Adopting a Healthy Lifestyle.  Know what a healthy weight is for you (roughly BMI <25) and aim to maintain this   Aim for 7+ servings of fruits and vegetables daily   65-80+ fluid ounces of water or unsweet tea for healthy kidneys   Limit to max 1 drink of alcohol per day; avoid smoking/tobacco   Limit animal fats in diet for cholesterol and heart health - choose grass fed whenever available   Avoid highly processed foods, and foods high in saturated/trans fats   Aim for low stress - take time to unwind and care for your mental health   Aim for 150  min of moderate intensity exercise weekly for heart health, and weights twice weekly for bone health   Aim for 7-9 hours of sleep daily   When it comes to diets, agreement about the perfect plan isnt easy to find, even among the experts. Experts at the [employer] developed an idea known as the Healthy Eating Plate. Just imagine a plate divided into logical, healthy portions.   The emphasis is on diet quality:   Load up on vegetables and fruits - one-half of your plate: Aim for color and variety, and remember that potatoes dont count.   Go for whole grains - one-quarter of your plate: Whole wheat, barley, wheat berries, quinoa, oats, brown rice, and foods made with them. If you want pasta, go with whole wheat pasta.   Protein power - one-quarter of your plate: Fish, chicken, beans, and nuts are all healthy, versatile protein sources. Limit red meat.   The diet, however, does go  beyond the plate, offering a few other suggestions.   Use healthy plant oils, such as olive, canola, soy, corn, sunflower and peanut. Check the labels, and avoid partially hydrogenated oil, which have unhealthy trans fats.   If youre thirsty, drink water. Coffee and tea are good in moderation, but skip sugary drinks and limit milk and dairy products to one or two daily servings.   The type of carbohydrate in the diet is more important than the amount. Some sources of carbohydrates, such as vegetables, fruits, whole grains, and beans-are healthier than others.   Finally, stay active  Signed, Berniece Salines, DO  10/11/2020 1:18 PM    [facility]

## 2020-10-11 NOTE — Patient Instructions (Signed)
Medication Instructions:  Your physician has recommended you make the following change in your medication:  START: Imdur 15 mg once daily START: Nitroglycerin 0.4 mg take one tablet by mouth every 5 minutes up to three times as needed for chest pain.  *If you need a refill on your cardiac medications before your next appointment, please call your pharmacy*   Lab Work: Your physician recommends that you return for lab work in:  Week of Jul 18-22 : Lpa,Vitamin D, BMET, Mag, CBC  If you have labs (blood work) drawn today and your tests are completely normal, you will receive your results only by: [place] (if you have MyChart) OR A paper copy in the mail If you have any lab test that is abnormal or we need to change your treatment, we will call you to review the results.   Testing/Procedures: Your physician has requested that you have an echocardiogram. Echocardiography is a painless test that uses sound waves to create images of your heart. It provides your doctor with information about the size and shape of your heart and how well your heart's chambers and valves are working. This procedure takes approximately one hour. There are no restrictions for this procedure.     [place], [place] [address] Dept: [phone] Loc: [phone]  Ho Parisi Wendling  10/11/2020  You are scheduled for a Cardiac Catheterization on Friday, July 29 with Dr. Sherren Mocha.  1. Please arrive at the [facility] (Main Entrance A) at [facility]: [address] [place], [place] [address] at 7:00 AM (This time is two hours before your procedure to ensure your preparation). Free valet parking service is available.   Special note: Every effort is made to have your procedure done on time. Please understand that emergencies sometimes delay scheduled procedures.  2. Diet: Do not eat  solid foods after midnight.  The patient may have clear liquids until 5am upon the day of the procedure.  3. Labs: You will need to have blood drawn on week of July 18-22  4. Medication instructions in preparation for your procedure:   Contrast Allergy: No   On the morning of your procedure, take your Aspirin and any morning medicines NOT listed above.  You may use sips of water.  5. Plan for one night stay--bring personal belongings. 6. Bring a current list of your medications and current insurance cards. 7. You MUST have a responsible person to drive you home. 8. Someone MUST be with you the first 24 hours after you arrive home or your discharge will be delayed. 9. Please wear clothes that are easy to get on and off and wear slip-on shoes.  Thank you for allowing [place] to care for you!   -- [facility]    Follow-Up: At [facility], you and your health needs are our priority.  As part of our continuing mission to provide you with exceptional heart care, we have created designated Provider Care Teams.  These Care Teams include your primary Cardiologist (physician) and Advanced Practice Providers (APPs -  Physician Assistants and Nurse Practitioners) who all work together to provide you with the care you need, when you need it.  We recommend signing up for the patient portal called "MyChart".  Sign up information is provided on this After Visit Summary.  MyChart is used to connect with patients for Virtual Visits (Telemedicine).  Patients are able to view lab/test results, encounter notes, upcoming appointments, etc.  Non-urgent messages can be sent to your provider as well.   To learn more about what you can do with MyChart, go to [email or web address].    Your next appointment:   6 week(s) post cath  The format for your next appointment:   In Person    Other Instructions Abdominal or Pelvic Ultrasound An ultrasound is a test that uses sound  waves to take pictures of the inside of the body. This is a safe and painless test that does not expose you to any X-rays. It is done using a handheld device (transducer) that is placed on your abdomen or pelvis and moved around. The transducer sends out sound waves that reflect off your tissues and organs to create imageson a computer screen. An abdominal ultrasound takes pictures of the inside of the abdomen. A pelvic ultrasound takes pictures of the inside of the pelvis. An abdominal or pelvic ultrasound may be done to: Check the shape or size of an organ. Check for problems such as: Cysts. Masses. Inflammation. Kidney stones. Gallstones. Tell a health care provider about: Any allergies you have. All medicines you are taking, including vitamins, herbs, eye drops, creams, and over-the-counter medicines. Any surgeries you have had. Any medical conditions you have. Whether you are pregnant or may be pregnant. What are the risks? There are no known risks or complications from having this test. What happens before the procedure? Follow instructions from your health care provider about eating or drinking before the test. Wear clothing that is easily washable in case the gel used for the test gets on your clothes. What happens during the procedure?  You will lie on an exam table. Your lower abdomen and pelvis will be exposed. A gel will be applied to your skin. It may feel cool. The transducer will be pressed on your abdomen or pelvis and moved back and forth, through the gel, over the area to be examined. The transducer will take pictures. These will be displayed on one or more computer monitors that look like small TV screens. You may be asked to change your position. After the exam, the gel will be cleaned off. What happens after the procedure? It is up to you to get your test results. Ask your health care provider, or the department that is doing the test, when your results will be  ready. Keep all follow-up visits as told by your health care provider. This is important. Summary An ultrasound is a test that uses sound waves to take pictures of the inside of the body. An abdominal or pelvic ultrasound may be done to check for cysts, masses, inflammation, kidney stones, or gallstones. During the procedure, a handheld device (transducer) will be placed on your abdomen or pelvis and moved around over the area to be examined. Ask your health care provider, or the department that is doing the test, when your results will be ready. This information is not intended to replace advice given to you by your health care provider. Make sure you discuss any questions you have with your healthcare provider. Document Revised: 10/16/2017 Document Reviewed: 10/19/2017 Elsevier Patient Education  [place].  Echocardiogram An echocardiogram is a test that uses sound waves (ultrasound) to produce images of the heart. Images from an echocardiogram can provide important information about: Heart size and shape. The size and thickness and movement of your heart's walls. Heart muscle function and strength. Heart valve function  or if you have stenosis. Stenosis is when the heart valves are too narrow. If blood is flowing backward through the heart valves (regurgitation). A tumor or infectious growth around the heart valves. Areas of heart muscle that are not working well because of poor blood flow or injury from a heart attack. Aneurysm detection. An aneurysm is a weak or damaged part of an artery wall. The wall bulges out from the normal force of blood pumping through the body. Tell a health care provider about: Any allergies you have. All medicines you are taking, including vitamins, herbs, eye drops, creams, and over-the-counter medicines. Any blood disorders you have. Any surgeries you have had. Any medical conditions you have. Whether you are pregnant or may be pregnant. What  are the risks? Generally, this is a safe test. However, problems may occur, including an allergic reaction to dye (contrast) that may be used during the test. What happens before the test? No specific preparation is needed. You may eat and drink normally. What happens during the test?  You will take off your clothes from the waist up and put on a hospital gown. Electrodes or electrocardiogram (ECG)patches may be placed on your chest. The electrodes or patches are then connected to a device that monitors your heart rate and rhythm. You will lie down on a table for an ultrasound exam. A gel will be applied to your chest to help sound waves pass through your skin. A handheld device, called a transducer, will be pressed against your chest and moved over your heart. The transducer produces sound waves that travel to your heart and bounce back (or "echo" back) to the transducer. These sound waves will be captured in real-time and changed into images of your heart that can be viewed on a video monitor. The images will be recorded on a computer and reviewed by your health care provider. You may be asked to change positions or hold your breath for a short time. This makes it easier to get different views or better views of your heart. In some cases, you may receive contrast through an IV in one of your veins. This can improve the quality of the pictures from your heart. The procedure may vary among health care providers and hospitals. What can I expect after the test? You may return to your normal, everyday life, including diet, activities, andmedicines, unless your health care provider tells you not to do that. Follow these instructions at home: It is up to you to get the results of your test. Ask your health care provider, or the department that is doing the test, when your results will be ready. Keep all follow-up visits. This is important. Summary An echocardiogram is a test that uses sound waves  (ultrasound) to produce images of the heart. Images from an echocardiogram can provide important information about the size and shape of your heart, heart muscle function, heart valve function, and other possible heart problems. You do not need to do anything to prepare before this test. You may eat and drink normally. After the echocardiogram is completed, you may return to your normal, everyday life, unless your health care provider tells you not to do that. This information is not intended to replace advice given to you by your health care provider. Make sure you discuss any questions you have with your healthcare provider. Document Revised: 11/15/2019 Document Reviewed: 11/15/2019 Elsevier Patient Education  2022 [employer].

## 2020-10-24 DIAGNOSIS — I10 Essential (primary) hypertension: Secondary | ICD-10-CM | POA: Diagnosis not present

## 2020-10-24 DIAGNOSIS — I2510 Atherosclerotic heart disease of native coronary artery without angina pectoris: Secondary | ICD-10-CM | POA: Diagnosis not present

## 2020-10-24 DIAGNOSIS — E782 Mixed hyperlipidemia: Secondary | ICD-10-CM | POA: Diagnosis not present

## 2020-10-24 DIAGNOSIS — R5383 Other fatigue: Secondary | ICD-10-CM | POA: Diagnosis not present

## 2020-10-25 LAB — BASIC METABOLIC PANEL
BUN/Creatinine Ratio: 16 (ref 10–24)
BUN: 15 mg/dL (ref 8–27)
CO2: 25 mmol/L (ref 20–29)
Calcium: 9.2 mg/dL (ref 8.6–10.2)
Chloride: 103 mmol/L (ref 96–106)
Creatinine, Ser: 0.96 mg/dL (ref 0.76–1.27)
Glucose: 95 mg/dL (ref 65–99)
Potassium: 4.3 mmol/L (ref 3.5–5.2)
Sodium: 139 mmol/L (ref 134–144)
eGFR: 88 mL/min/{1.73_m2} (ref >59)

## 2020-10-25 LAB — CBC WITH DIFFERENTIAL/PLATELET
Basophils Absolute: 0 10*3/uL (ref 0.0–0.2)
Basos: 1 %
EOS (ABSOLUTE): 0.1 10*3/uL (ref 0.0–0.4)
Eos: 2 %
Hematocrit: 43 % (ref 37.5–51.0)
Hemoglobin: 14.2 g/dL (ref 13.0–17.7)
Immature Grans (Abs): 0 10*3/uL (ref 0.0–0.1)
Immature Granulocytes: 0 %
Lymphocytes Absolute: 1.4 10*3/uL (ref 0.7–3.1)
Lymphs: 28 %
MCH: 30.3 pg (ref 26.6–33.0)
MCHC: 33 g/dL (ref 31.5–35.7)
MCV: 92 fL (ref 79–97)
Monocytes Absolute: 0.4 10*3/uL (ref 0.1–0.9)
Monocytes: 8 %
Neutrophils Absolute: 3 10*3/uL (ref 1.4–7.0)
Neutrophils: 61 %
Platelets: 163 10*3/uL (ref 150–450)
RBC: 4.68 x10E6/uL (ref 4.14–5.80)
RDW: 13 % (ref 11.6–15.4)
WBC: 4.9 10*3/uL (ref 3.4–10.8)

## 2020-10-25 LAB — LIPOPROTEIN A (LPA): Lipoprotein (a): 532.5 nmol/L — ABNORMAL HIGH (ref <75.0)

## 2020-10-25 LAB — VITAMIN D 25 HYDROXY (VIT D DEFICIENCY, FRACTURES): Vit D, 25-Hydroxy: 73.2 ng/mL (ref 30.0–100.0)

## 2020-10-25 LAB — MAGNESIUM: Magnesium: 2.2 mg/dL (ref 1.6–2.3)

## 2020-10-26 ENCOUNTER — Telehealth: Payer: Self-pay | Admitting: Cardiology

## 2020-10-26 ENCOUNTER — Other Ambulatory Visit: Payer: Self-pay

## 2020-10-26 DIAGNOSIS — E785 Hyperlipidemia, unspecified: Secondary | ICD-10-CM

## 2020-10-26 NOTE — Telephone Encounter (Signed)
Pt is calling back to get his recent lab results. Please advise pt further

## 2020-10-26 NOTE — Progress Notes (Signed)
Referral placed.

## 2020-10-31 ENCOUNTER — Ambulatory Visit (HOSPITAL_BASED_OUTPATIENT_CLINIC_OR_DEPARTMENT_OTHER): Payer: PPO

## 2020-10-31 ENCOUNTER — Ambulatory Visit (HOSPITAL_COMMUNITY)
Admission: RE | Admit: 2020-10-31 | Discharge: 2020-10-31 | Disposition: A | Discharge status: Home / Self Care | Payer: PPO | Source: Ambulatory Visit | Attending: Cardiology | Admitting: Cardiology

## 2020-10-31 ENCOUNTER — Other Ambulatory Visit (HOSPITAL_COMMUNITY): Payer: Self-pay | Admitting: Cardiology

## 2020-10-31 ENCOUNTER — Other Ambulatory Visit: Payer: Self-pay

## 2020-10-31 DIAGNOSIS — R5383 Other fatigue: Secondary | ICD-10-CM | POA: Diagnosis not present

## 2020-10-31 DIAGNOSIS — I119 Hypertensive heart disease without heart failure: Secondary | ICD-10-CM | POA: Insufficient documentation

## 2020-10-31 DIAGNOSIS — E785 Hyperlipidemia, unspecified: Secondary | ICD-10-CM | POA: Diagnosis not present

## 2020-10-31 DIAGNOSIS — I714 Abdominal aortic aneurysm, without rupture, unspecified: Secondary | ICD-10-CM

## 2020-10-31 DIAGNOSIS — I2510 Atherosclerotic heart disease of native coronary artery without angina pectoris: Secondary | ICD-10-CM | POA: Insufficient documentation

## 2020-10-31 DIAGNOSIS — E663 Overweight: Secondary | ICD-10-CM | POA: Insufficient documentation

## 2020-10-31 DIAGNOSIS — I609 Nontraumatic subarachnoid hemorrhage, unspecified: Secondary | ICD-10-CM | POA: Insufficient documentation

## 2020-10-31 DIAGNOSIS — Z87891 Personal history of nicotine dependence: Secondary | ICD-10-CM | POA: Insufficient documentation

## 2020-10-31 LAB — ECHOCARDIOGRAM COMPLETE
Area-P 1/2: 3.95 cm2
S' Lateral: 3.8 cm

## 2020-11-01 ENCOUNTER — Telehealth: Payer: Self-pay | Admitting: *Deleted

## 2020-11-01 NOTE — Telephone Encounter (Signed)
Pt contacted pre-catheterization scheduled at [facility] for: Friday November 02, 2020 9 AM Verified arrival time and place: [facility]) at: 7 AM   No solid food after midnight prior to cath, clear liquids until 5 AM day of procedure.   AM meds can be  taken pre-cath with sips of water including: aspirin 81 mg   Confirmed patient has responsible adult to drive home post procedure and be with patient first 24 hours after arriving home: yes  You are allowed ONE visitor in the waiting room during the time you are at the hospital for your procedure. Both you and your visitor must wear a mask once you enter the hospital.   Patient reports does not currently have any symptoms concerning for COVID-19 and no household members with COVID-19 like illness.       Reviewed procedure/mask/visitor instructions with patient.

## 2020-11-02 ENCOUNTER — Other Ambulatory Visit: Payer: Self-pay

## 2020-11-02 ENCOUNTER — Ambulatory Visit (HOSPITAL_COMMUNITY)
Admission: RE | Admit: 2020-11-02 | Discharge: 2020-11-02 | Disposition: A | Discharge status: Home / Self Care | Payer: PPO | Attending: Cardiovascular Disease | Admitting: Cardiovascular Disease

## 2020-11-02 ENCOUNTER — Encounter (HOSPITAL_COMMUNITY): Payer: Self-pay | Admitting: Cardiovascular Disease

## 2020-11-02 ENCOUNTER — Telehealth: Payer: Self-pay | Admitting: Family Medicine

## 2020-11-02 ENCOUNTER — Encounter (HOSPITAL_COMMUNITY)
Admission: RE | Disposition: A | Discharge status: Home / Self Care | Payer: Self-pay | Source: Home / Self Care | Attending: Cardiovascular Disease

## 2020-11-02 DIAGNOSIS — Z79899 Other long term (current) drug therapy: Secondary | ICD-10-CM | POA: Insufficient documentation

## 2020-11-02 DIAGNOSIS — Z886 Allergy status to analgesic agent status: Secondary | ICD-10-CM | POA: Insufficient documentation

## 2020-11-02 DIAGNOSIS — I10 Essential (primary) hypertension: Secondary | ICD-10-CM | POA: Diagnosis not present

## 2020-11-02 DIAGNOSIS — I714 Abdominal aortic aneurysm, without rupture: Secondary | ICD-10-CM | POA: Diagnosis not present

## 2020-11-02 DIAGNOSIS — Z87891 Personal history of nicotine dependence: Secondary | ICD-10-CM | POA: Diagnosis not present

## 2020-11-02 DIAGNOSIS — I2510 Atherosclerotic heart disease of native coronary artery without angina pectoris: Secondary | ICD-10-CM | POA: Diagnosis not present

## 2020-11-02 DIAGNOSIS — Z8249 Family history of ischemic heart disease and other diseases of the circulatory system: Secondary | ICD-10-CM | POA: Diagnosis not present

## 2020-11-02 DIAGNOSIS — E663 Overweight: Secondary | ICD-10-CM | POA: Insufficient documentation

## 2020-11-02 DIAGNOSIS — E782 Mixed hyperlipidemia: Secondary | ICD-10-CM | POA: Insufficient documentation

## 2020-11-02 DIAGNOSIS — Z6826 Body mass index (BMI) 26.0-26.9, adult: Secondary | ICD-10-CM | POA: Diagnosis not present

## 2020-11-02 DIAGNOSIS — R5383 Other fatigue: Secondary | ICD-10-CM | POA: Diagnosis not present

## 2020-11-02 HISTORY — PX: LEFT HEART CATH AND CORONARY ANGIOGRAPHY: CATH118249

## 2020-11-02 HISTORY — PX: CORONARY PRESSURE/FFR STUDY: CATH118243

## 2020-11-02 LAB — POCT ACTIVATED CLOTTING TIME: Activated Clotting Time: 260 seconds

## 2020-11-02 SURGERY — LEFT HEART CATH AND CORONARY ANGIOGRAPHY
Anesthesia: LOCAL

## 2020-11-02 MED ORDER — SODIUM CHLORIDE 0.9% FLUSH: 3.0000 mL | Freq: Two times a day (BID) | INTRAVENOUS | Status: DC

## 2020-11-02 MED ORDER — MIDAZOLAM HCL 2 MG/2ML IJ SOLN
INTRAMUSCULAR | Status: AC
  Filled 2020-11-02: qty 2

## 2020-11-02 MED ORDER — SODIUM CHLORIDE 0.9 % WEIGHT BASED INFUSION: 1.0000 mL/kg/h | INTRAVENOUS | Status: DC

## 2020-11-02 MED ORDER — LABETALOL HCL 5 MG/ML IV SOLN: 10.0000 mg | INTRAVENOUS | Status: DC | PRN

## 2020-11-02 MED ORDER — IOHEXOL 350 MG/ML SOLN
INTRAVENOUS | Status: DC | PRN
  Administered 2020-11-02: 70 mL

## 2020-11-02 MED ORDER — ASPIRIN 81 MG PO CHEW: 81.0000 mg | CHEWABLE_TABLET | ORAL | Status: DC

## 2020-11-02 MED ORDER — FENTANYL CITRATE (PF) 100 MCG/2ML IJ SOLN
INTRAMUSCULAR | Status: AC
  Filled 2020-11-02: qty 2

## 2020-11-02 MED ORDER — HYDRALAZINE HCL 20 MG/ML IJ SOLN: 10.0000 mg | INTRAMUSCULAR | Status: DC | PRN

## 2020-11-02 MED ORDER — ONDANSETRON HCL 4 MG/2ML IJ SOLN: 4.0000 mg | Freq: Four times a day (QID) | INTRAMUSCULAR | Status: DC | PRN

## 2020-11-02 MED ORDER — HEPARIN (PORCINE) IN NACL 1000-0.9 UT/500ML-% IV SOLN
INTRAVENOUS | Status: DC | PRN
  Administered 2020-11-02: 500 mL

## 2020-11-02 MED ORDER — AMLODIPINE BESYLATE 5 MG PO TABS: 5.0000 mg | ORAL_TABLET | Freq: Every day | ORAL | 0 refills | Status: DC

## 2020-11-02 MED ORDER — HEPARIN (PORCINE) IN NACL 1000-0.9 UT/500ML-% IV SOLN
INTRAVENOUS | Status: AC
  Filled 2020-11-02: qty 1000

## 2020-11-02 MED ORDER — MIDAZOLAM HCL 2 MG/2ML IJ SOLN
INTRAMUSCULAR | Status: DC | PRN
  Administered 2020-11-02: 2 mg via INTRAVENOUS

## 2020-11-02 MED ORDER — SODIUM CHLORIDE 0.9% FLUSH: 3.0000 mL | INTRAVENOUS | Status: DC | PRN

## 2020-11-02 MED ORDER — CLOPIDOGREL BISULFATE 75 MG PO TABS: 75.0000 mg | ORAL_TABLET | Freq: Every day | ORAL | 11 refills | Status: DC

## 2020-11-02 MED ORDER — ACETAMINOPHEN 325 MG PO TABS: 650.0000 mg | ORAL_TABLET | ORAL | Status: DC | PRN

## 2020-11-02 MED ORDER — VERAPAMIL HCL 2.5 MG/ML IV SOLN
INTRAVENOUS | Status: AC
  Filled 2020-11-02: qty 2

## 2020-11-02 MED ORDER — HEPARIN SODIUM (PORCINE) 1000 UNIT/ML IJ SOLN
INTRAMUSCULAR | Status: AC
  Filled 2020-11-02: qty 1

## 2020-11-02 MED ORDER — HEPARIN SODIUM (PORCINE) 1000 UNIT/ML IJ SOLN
INTRAMUSCULAR | Status: DC | PRN
  Administered 2020-11-02: 5000 [IU] via INTRAVENOUS
  Administered 2020-11-02: 3000 [IU] via INTRAVENOUS

## 2020-11-02 MED ORDER — SODIUM CHLORIDE 0.9 % IV SOLN: 250.0000 mL | INTRAVENOUS | Status: DC | PRN

## 2020-11-02 MED ORDER — VERAPAMIL HCL 2.5 MG/ML IV SOLN
INTRAVENOUS | Status: DC | PRN
  Administered 2020-11-02: 10 mL via INTRA_ARTERIAL

## 2020-11-02 MED ORDER — LIDOCAINE HCL (PF) 1 % IJ SOLN
INTRAMUSCULAR | Status: AC
  Filled 2020-11-02: qty 30

## 2020-11-02 MED ORDER — SODIUM CHLORIDE 0.9 % WEIGHT BASED INFUSION
3.0000 mL/kg/h | INTRAVENOUS | Status: AC
  Administered 2020-11-02: 3 mL/kg/h via INTRAVENOUS

## 2020-11-02 MED ORDER — LIDOCAINE HCL (PF) 1 % IJ SOLN
INTRAMUSCULAR | Status: DC | PRN
  Administered 2020-11-02: 2 mL

## 2020-11-02 MED ORDER — FENTANYL CITRATE (PF) 100 MCG/2ML IJ SOLN
INTRAMUSCULAR | Status: DC | PRN
  Administered 2020-11-02: 25 ug via INTRAVENOUS

## 2020-11-02 MED ORDER — NITROGLYCERIN 1 MG/10 ML FOR IR/CATH LAB
INTRA_ARTERIAL | Status: AC
  Filled 2020-11-02: qty 10

## 2020-11-02 SURGICAL SUPPLY — 13 items
CATH 5FR JL3.5 JR4 ANG PIG MP (CATHETERS) ×2 IMPLANT
CATH VISTA GUIDE 6FR JR4 (CATHETERS) ×2 IMPLANT
CATH VISTA GUIDE 6FR XB3.5 (CATHETERS) ×4 IMPLANT
DEVICE RAD COMP TR BAND LRG (VASCULAR PRODUCTS) ×2 IMPLANT
GLIDESHEATH SLEND SS 6F .021 (SHEATH) ×2 IMPLANT
GUIDEWIRE INQWIRE 1.5J.035X260 (WIRE) ×1 IMPLANT
GUIDEWIRE PRESSURE X 175 (WIRE) ×2 IMPLANT
INQWIRE 1.5J .035X260CM (WIRE) ×2
KIT ENCORE 26 ADVANTAGE (KITS) ×2 IMPLANT
KIT HEART LEFT (KITS) ×2 IMPLANT
PACK CARDIAC CATHETERIZATION (CUSTOM PROCEDURE TRAY) ×2 IMPLANT
TRANSDUCER W/STOPCOCK (MISCELLANEOUS) ×2 IMPLANT
TUBING CIL FLEX 10 FLL-RA (TUBING) ×2 IMPLANT

## 2020-11-02 NOTE — Progress Notes (Signed)
Pt ambulated without difficulty or bleeding.   Discharged home with wife who will drive and stay with pt x 24 hrs . Arm board placed at (R) radial

## 2020-11-02 NOTE — Discharge Instructions (Addendum)
Come to [facility] Admitting on Tuesday 11/13/20. Arrive at 530am. Nothing to eat or drink after midnight the night before. Someone will call you closer to procedure day.    Radial Site Care  This sheet gives you information about how to care for yourself after your procedure. Your health care provider may also give you more specific instructions. If you have problems or questions, contact your health care provider. What can I expect after the procedure? After the procedure, it is common to have: Bruising and tenderness at the catheter insertion area. Follow these instructions at home: Medicines Take over-the-counter and prescription medicines only as told by your health care provider. Insertion site care Follow instructions from your health care provider about how to take care of your insertion site. Make sure you: Wash your hands with soap and water before you remove your bandage (dressing). If soap and water are not available, use hand sanitizer. May remove dressing in 24 hours. Check your insertion site every day for signs of infection. Check for: Redness, swelling, or pain. Fluid or blood. Pus or a bad smell. Warmth. Do no take baths, swim, or use a hot tub for 5 days. You may shower 24-48 hours after the procedure. Remove the dressing and gently wash the site with plain soap and water. Pat the area dry with a clean towel. Do not rub the site. That could cause bleeding. Do not apply powder or lotion to the site. Activity  For 24 hours after the procedure, or as directed by your health care provider: Do not flex or bend the affected arm. Do not push or pull heavy objects with the affected arm. Do not drive yourself home from the hospital or clinic. You may drive 24 hours after the procedure. Do not operate machinery or power tools. KEEP ARM ELEVATED THE REMAINDER OF THE DAY. Do not push, pull or lift anything that is heavier than 10 lb for 5 days. Ask your health care provider  when it is okay to: Return to work or school. Resume usual physical activities or sports. Resume sexual activity. General instructions If the catheter site starts to bleed, raise your arm and put firm pressure on the site. If the bleeding does not stop, get help right away. This is a medical emergency. DRINK PLENTY OF FLUIDS FOR THE NEXT 2-3 DAYS. No alcohol consumption for 24 hours after receiving sedation. If you went home on the same day as your procedure, a responsible adult should be with you for the first 24 hours after you arrive home. Keep all follow-up visits as told by your health care provider. This is important. Contact a health care provider if: You have a fever. You have redness, swelling, or yellow drainage around your insertion site. Get help right away if: You have unusual pain at the radial site. The catheter insertion area swells very fast. The insertion area is bleeding, and the bleeding does not stop when you hold steady pressure on the area. Your arm or hand becomes pale, cool, tingly, or numb. These symptoms may represent a serious problem that is an emergency. Do not wait to see if the symptoms will go away. Get medical help right away. Call your local emergency services (911 in the [place] Do not drive yourself to the hospital. Summary After the procedure, it is common to have bruising and tenderness at the site. Follow instructions from your health care provider about how to take care of your radial site wound. Check the wound  every day for signs of infection.  This information is not intended to replace advice given to you by your health care provider. Make sure you discuss any questions you have with your health care provider. Document Revised: 04/29/2017 Document Reviewed: 04/29/2017 Elsevier Patient Education  2020 [employer].

## 2020-11-02 NOTE — Telephone Encounter (Signed)
Requested Rx sent in patient aware 

## 2020-11-02 NOTE — Interval H&P Note (Signed)
Cath Lab Visit (complete for each Cath Lab visit)  Clinical Evaluation Leading to the Procedure:   ACS: No.  Non-ACS:    Anginal Classification: CCS II  Anti-ischemic medical therapy: Minimal Therapy (1 class of medications)  Non-Invasive Test Results: No non-invasive testing performed  Prior CABG: No previous CABG      History and Physical Interval Note:  11/02/2020 9:05 AM  Patrick [name]  has presented today for surgery, with the diagnosis of coronary artery disease.  The various methods of treatment have been discussed with the patient and family. After consideration of risks, benefits and other options for treatment, the patient has consented to  Procedure(s): LEFT HEART CATH AND CORONARY ANGIOGRAPHY (N/A) as a surgical intervention.  The patient's history has been reviewed, patient examined, no change in status, stable for surgery.  I have reviewed the patient's chart and labs.  Questions were answered to the patient's satisfaction.     Sherren Mocha

## 2020-11-02 NOTE — Telephone Encounter (Signed)
Caller Name: Garo Call back phone #: [phone]  MEDICATION(S): amLODipine (NORVASC) 5 MG tablet   Has the patient contacted their pharmacy? Yes - sent to the previous MD - pt out of meds and asking for RX today.  Preferred Pharmacy:  [facility] # [address] [place] Phone:  [phone]  Fax:  [phone]

## 2020-11-06 ENCOUNTER — Other Ambulatory Visit: Payer: Self-pay

## 2020-11-06 ENCOUNTER — Telehealth: Payer: Self-pay | Admitting: Cardiovascular Disease

## 2020-11-06 ENCOUNTER — Telehealth: Payer: Self-pay

## 2020-11-06 ENCOUNTER — Other Ambulatory Visit: Payer: Self-pay | Admitting: *Deleted

## 2020-11-06 DIAGNOSIS — R109 Unspecified abdominal pain: Secondary | ICD-10-CM | POA: Diagnosis not present

## 2020-11-06 NOTE — Telephone Encounter (Signed)
-----   Message from Berniece Salines, DO sent at 11/05/2020  4:50 PM EDT ----- Your echocardiogram showed low normal ejection fraction.  Your valves are all within normal limits. Your abdominal aorta is dilated we will repeat ultrasound in another year.

## 2020-11-06 NOTE — Telephone Encounter (Signed)
Spoke with patient. He states his right kidney started hurting yesterday. "I read that can be a possibility. I was just not sure who to call.: Patient informed he may have to call his PCP to get a urine sample evaluated. Patient given the number for Dr. Antionette Char office to get further instruction on the pain he has been having since it might be related to the craterization. He verbalized understanding and thanked me for calling him.

## 2020-11-06 NOTE — Telephone Encounter (Signed)
I think it's unlikely that this is related to his heart catheterization. However, it would be reasonable to check a CBC, BMET, and urinalysis to evaluate. OK to go through PCP to have these things done.

## 2020-11-06 NOTE — Telephone Encounter (Signed)
Patient called and said that he had a heart catherization done by Dr. Burt Knack but mentioned that his kidney on his left side has been hurt. Patient is concerned that he will develop an infection in his kidney.

## 2020-11-06 NOTE — Progress Notes (Signed)
Orders placed per Dr. Burt Knack.

## 2020-11-06 NOTE — Telephone Encounter (Signed)
Spoke with patient regarding results and recommendation.  Patient verbalizes understanding and is agreeable to plan of care. Advised patient to call back with any issues or concerns.  

## 2020-11-06 NOTE — Telephone Encounter (Signed)
Orders placed per Dr. Burt Knack.

## 2020-11-07 LAB — CBC WITH DIFFERENTIAL/PLATELET
Basophils Absolute: 0 10*3/uL (ref 0.0–0.2)
Basos: 1 %
EOS (ABSOLUTE): 0.1 10*3/uL (ref 0.0–0.4)
Eos: 1 %
Hematocrit: 43.8 % (ref 37.5–51.0)
Hemoglobin: 14.6 g/dL (ref 13.0–17.7)
Immature Grans (Abs): 0 10*3/uL (ref 0.0–0.1)
Immature Granulocytes: 0 %
Lymphocytes Absolute: 1.6 10*3/uL (ref 0.7–3.1)
Lymphs: 30 %
MCH: 29.9 pg (ref 26.6–33.0)
MCHC: 33.3 g/dL (ref 31.5–35.7)
MCV: 90 fL (ref 79–97)
Monocytes Absolute: 0.4 10*3/uL (ref 0.1–0.9)
Monocytes: 7 %
Neutrophils Absolute: 3.2 10*3/uL (ref 1.4–7.0)
Neutrophils: 61 %
Platelets: 193 10*3/uL (ref 150–450)
RBC: 4.89 x10E6/uL (ref 4.14–5.80)
RDW: 12.8 % (ref 11.6–15.4)
WBC: 5.2 10*3/uL (ref 3.4–10.8)

## 2020-11-07 LAB — BASIC METABOLIC PANEL
BUN/Creatinine Ratio: 11 (ref 10–24)
BUN: 12 mg/dL (ref 8–27)
CO2: 24 mmol/L (ref 20–29)
Calcium: 9.9 mg/dL (ref 8.6–10.2)
Chloride: 101 mmol/L (ref 96–106)
Creatinine, Ser: 1.1 mg/dL (ref 0.76–1.27)
Glucose: 82 mg/dL (ref 65–99)
Potassium: 4.3 mmol/L (ref 3.5–5.2)
Sodium: 139 mmol/L (ref 134–144)
eGFR: 74 mL/min/{1.73_m2} (ref >59)

## 2020-11-07 LAB — URINALYSIS
Bilirubin, UA: NEGATIVE
Glucose, UA: NEGATIVE
Ketones, UA: NEGATIVE
Leukocytes,UA: NEGATIVE
Nitrite, UA: NEGATIVE
Protein,UA: NEGATIVE
RBC, UA: NEGATIVE
Specific Gravity, UA: 1.012 (ref 1.005–1.030)
Urobilinogen, Ur: 0.2 mg/dL (ref 0.2–1.0)
pH, UA: 6 (ref 5.0–7.5)

## 2020-11-07 MED FILL — Nitroglycerin IV Soln 100 MCG/ML in D5W: INTRA_ARTERIAL | Qty: 10 | Status: AC

## 2020-11-12 ENCOUNTER — Other Ambulatory Visit: Payer: Self-pay

## 2020-11-12 ENCOUNTER — Telehealth: Payer: Self-pay | Admitting: *Deleted

## 2020-11-12 ENCOUNTER — Telehealth: Payer: Self-pay | Admitting: Family Medicine

## 2020-11-12 MED ORDER — ROSUVASTATIN CALCIUM 40 MG PO TABS: 40.0000 mg | ORAL_TABLET | Freq: Every evening | ORAL | 0 refills | Status: DC

## 2020-11-12 NOTE — Telephone Encounter (Signed)
Refill request for pending Rx last refill May 2018 last OV 11/02/20. Please advise.

## 2020-11-12 NOTE — Telephone Encounter (Signed)
Coronary atherectomy scheduled at [facility] for: Tuesday November 13, 2020 7:30 [facility] Main Entrance A [facility]) at: 5:30 AM   No solid food after midnight prior to cath, clear liquids until 5 AM day of procedure.   AM meds can be  taken pre-cath with sips of water including:   aspirin 81 mg   Plavix 75 mg  Confirmed patient has responsible adult to drive home post procedure and be with patient first 24 hours after arriving home.  Patients are allowed one visitor in the waiting room during the time they are at the hospital for their procedure. Both patient and visitor must wear a mask once they enter the hospital.   Patient reports does not currently have any symptoms concerning for COVID-19 and no household members with COVID-19 like illness.            Reviewed procedure/mask/visitor instructions with patient.

## 2020-11-12 NOTE — Telephone Encounter (Signed)
What is the name of the medication? amLODipine (NORVASC) 5 MG tablet [id number]   Have you contacted your pharmacy to request a refill? Pt is wanting a refill on this script. I let him know Dr. Ethelene Hal has never filled this script before and he may need an appointment. He is having a heart cath and stint coming up very soon. He says it is difficult for him to come in at this moment.   Which pharmacy would you like this sent to? Pharmacy  [facility] # [address], [place]  [address] [place] [address]  Phone:  [phone]  Fax:  [phone]      Patient notified that their request is being sent to the clinical staff for review and that they should receive a call once it is complete. If they do not receive a call within 72 hours they can check with their pharmacy or our office.

## 2020-11-13 ENCOUNTER — Other Ambulatory Visit: Payer: Self-pay

## 2020-11-13 ENCOUNTER — Ambulatory Visit (HOSPITAL_COMMUNITY)
Admission: RE | Admit: 2020-11-13 | Discharge: 2020-11-13 | Disposition: A | Discharge status: Home / Self Care | Payer: PPO | Attending: Cardiovascular Disease | Admitting: Cardiovascular Disease

## 2020-11-13 ENCOUNTER — Ambulatory Visit (HOSPITAL_COMMUNITY)
Admission: RE | Disposition: A | Discharge status: Home / Self Care | Payer: Self-pay | Source: Home / Self Care | Attending: Cardiovascular Disease

## 2020-11-13 DIAGNOSIS — Z79899 Other long term (current) drug therapy: Secondary | ICD-10-CM | POA: Diagnosis not present

## 2020-11-13 DIAGNOSIS — I714 Abdominal aortic aneurysm, without rupture: Secondary | ICD-10-CM | POA: Diagnosis not present

## 2020-11-13 DIAGNOSIS — Z886 Allergy status to analgesic agent status: Secondary | ICD-10-CM | POA: Insufficient documentation

## 2020-11-13 DIAGNOSIS — I209 Angina pectoris, unspecified: Secondary | ICD-10-CM | POA: Diagnosis present

## 2020-11-13 DIAGNOSIS — I10 Essential (primary) hypertension: Secondary | ICD-10-CM | POA: Insufficient documentation

## 2020-11-13 DIAGNOSIS — E782 Mixed hyperlipidemia: Secondary | ICD-10-CM | POA: Diagnosis not present

## 2020-11-13 DIAGNOSIS — I25119 Atherosclerotic heart disease of native coronary artery with unspecified angina pectoris: Secondary | ICD-10-CM | POA: Diagnosis not present

## 2020-11-13 DIAGNOSIS — Z87891 Personal history of nicotine dependence: Secondary | ICD-10-CM | POA: Insufficient documentation

## 2020-11-13 DIAGNOSIS — Z955 Presence of coronary angioplasty implant and graft: Secondary | ICD-10-CM

## 2020-11-13 DIAGNOSIS — I2584 Coronary atherosclerosis due to calcified coronary lesion: Secondary | ICD-10-CM | POA: Insufficient documentation

## 2020-11-13 DIAGNOSIS — Z8673 Personal history of transient ischemic attack (TIA), and cerebral infarction without residual deficits: Secondary | ICD-10-CM | POA: Insufficient documentation

## 2020-11-13 DIAGNOSIS — E663 Overweight: Secondary | ICD-10-CM | POA: Insufficient documentation

## 2020-11-13 DIAGNOSIS — Z8249 Family history of ischemic heart disease and other diseases of the circulatory system: Secondary | ICD-10-CM | POA: Insufficient documentation

## 2020-11-13 DIAGNOSIS — Z6826 Body mass index (BMI) 26.0-26.9, adult: Secondary | ICD-10-CM | POA: Diagnosis not present

## 2020-11-13 HISTORY — PX: CORONARY ATHERECTOMY: CATH118238

## 2020-11-13 LAB — POCT ACTIVATED CLOTTING TIME
Activated Clotting Time: 231 seconds
Activated Clotting Time: 289 seconds
Activated Clotting Time: 266 seconds

## 2020-11-13 SURGERY — CORONARY ATHERECTOMY
Anesthesia: LOCAL

## 2020-11-13 MED ORDER — VIPERSLIDE LUBRICANT OPTIME
TOPICAL | Status: DC | PRN
  Administered 2020-11-13: 20 mL via SURGICAL_CAVITY

## 2020-11-13 MED ORDER — IOHEXOL 350 MG/ML SOLN
INTRAVENOUS | Status: DC | PRN
  Administered 2020-11-13: 105 mL

## 2020-11-13 MED ORDER — CLOPIDOGREL BISULFATE 75 MG PO TABS: 75.0000 mg | ORAL_TABLET | Freq: Every day | ORAL | 2 refills | Status: AC

## 2020-11-13 MED ORDER — FENTANYL CITRATE (PF) 100 MCG/2ML IJ SOLN
INTRAMUSCULAR | Status: AC
  Filled 2020-11-13: qty 2

## 2020-11-13 MED ORDER — CLOPIDOGREL BISULFATE 75 MG PO TABS: 75.0000 mg | ORAL_TABLET | ORAL | Status: AC

## 2020-11-13 MED ORDER — LIDOCAINE HCL (PF) 1 % IJ SOLN
INTRAMUSCULAR | Status: DC | PRN
  Administered 2020-11-13: 2 mL

## 2020-11-13 MED ORDER — FENTANYL CITRATE (PF) 100 MCG/2ML IJ SOLN
INTRAMUSCULAR | Status: DC | PRN
  Administered 2020-11-13 (×2): 25 ug via INTRAVENOUS

## 2020-11-13 MED ORDER — HEPARIN SODIUM (PORCINE) 1000 UNIT/ML IJ SOLN
INTRAMUSCULAR | Status: AC
  Filled 2020-11-13: qty 1

## 2020-11-13 MED ORDER — SODIUM CHLORIDE 0.9 % IV SOLN
INTRAVENOUS | Status: AC | PRN
  Administered 2020-11-13: 500 mL/h via INTRAVENOUS

## 2020-11-13 MED ORDER — HEPARIN (PORCINE) IN NACL 1000-0.9 UT/500ML-% IV SOLN
INTRAVENOUS | Status: DC | PRN
  Administered 2020-11-13: 500 mL

## 2020-11-13 MED ORDER — SODIUM CHLORIDE 0.9 % IV SOLN: 250.0000 mL | INTRAVENOUS | Status: DC | PRN

## 2020-11-13 MED ORDER — SODIUM CHLORIDE 0.9% FLUSH: 3.0000 mL | Freq: Two times a day (BID) | INTRAVENOUS | Status: DC

## 2020-11-13 MED ORDER — LIDOCAINE HCL (PF) 1 % IJ SOLN
INTRAMUSCULAR | Status: AC
  Filled 2020-11-13: qty 30

## 2020-11-13 MED ORDER — NITROGLYCERIN 1 MG/10 ML FOR IR/CATH LAB
INTRA_ARTERIAL | Status: AC
  Filled 2020-11-13: qty 10

## 2020-11-13 MED ORDER — SODIUM CHLORIDE 0.9 % WEIGHT BASED INFUSION: 1.0000 mL/kg/h | INTRAVENOUS | Status: DC

## 2020-11-13 MED ORDER — SODIUM CHLORIDE 0.9% FLUSH: 3.0000 mL | INTRAVENOUS | Status: DC | PRN

## 2020-11-13 MED ORDER — ACETAMINOPHEN 325 MG PO TABS
650.0000 mg | ORAL_TABLET | ORAL | Status: DC | PRN
  Administered 2020-11-13 (×2): 650 mg via ORAL
  Filled 2020-11-13 (×2): qty 2

## 2020-11-13 MED ORDER — ASPIRIN 81 MG PO CHEW: 81.0000 mg | CHEWABLE_TABLET | ORAL | Status: AC

## 2020-11-13 MED ORDER — HEPARIN SODIUM (PORCINE) 1000 UNIT/ML IJ SOLN
INTRAMUSCULAR | Status: DC | PRN
  Administered 2020-11-13: 9000 [IU] via INTRAVENOUS
  Administered 2020-11-13: 4000 [IU] via INTRAVENOUS

## 2020-11-13 MED ORDER — MIDAZOLAM HCL 2 MG/2ML IJ SOLN
INTRAMUSCULAR | Status: AC
  Filled 2020-11-13: qty 2

## 2020-11-13 MED ORDER — SODIUM CHLORIDE 0.9 % WEIGHT BASED INFUSION
3.0000 mL/kg/h | INTRAVENOUS | Status: AC
  Administered 2020-11-13: 3 mL/kg/h via INTRAVENOUS

## 2020-11-13 MED ORDER — NITROGLYCERIN 1 MG/10 ML FOR IR/CATH LAB
INTRA_ARTERIAL | Status: DC | PRN
  Administered 2020-11-13: 100 ug
  Administered 2020-11-13: 150 ug
  Administered 2020-11-13 (×2): 100 ug

## 2020-11-13 MED ORDER — VERAPAMIL HCL 2.5 MG/ML IV SOLN
INTRAVENOUS | Status: DC | PRN
  Administered 2020-11-13: 10 mL via INTRA_ARTERIAL

## 2020-11-13 MED ORDER — HEPARIN (PORCINE) IN NACL 1000-0.9 UT/500ML-% IV SOLN
INTRAVENOUS | Status: AC
  Filled 2020-11-13: qty 1000

## 2020-11-13 MED ORDER — MIDAZOLAM HCL 2 MG/2ML IJ SOLN
INTRAMUSCULAR | Status: DC | PRN
  Administered 2020-11-13: 2 mg via INTRAVENOUS
  Administered 2020-11-13: 1 mg via INTRAVENOUS

## 2020-11-13 MED ORDER — SODIUM CHLORIDE 0.9 % IV SOLN
5.0000 mg/kg | Freq: Once | INTRAVENOUS | Status: AC
  Administered 2020-11-13: 426.5 mg via INTRAVENOUS
  Filled 2020-11-13: qty 17.06

## 2020-11-13 MED ORDER — HYDRALAZINE HCL 20 MG/ML IJ SOLN: 10.0000 mg | INTRAMUSCULAR | Status: AC | PRN

## 2020-11-13 MED ORDER — VERAPAMIL HCL 2.5 MG/ML IV SOLN
INTRAVENOUS | Status: AC
  Filled 2020-11-13: qty 2

## 2020-11-13 MED ORDER — ONDANSETRON HCL 4 MG/2ML IJ SOLN: 4.0000 mg | Freq: Four times a day (QID) | INTRAMUSCULAR | Status: DC | PRN

## 2020-11-13 MED ORDER — LABETALOL HCL 5 MG/ML IV SOLN: 10.0000 mg | INTRAVENOUS | Status: AC | PRN

## 2020-11-13 SURGICAL SUPPLY — 22 items
BALLN SAPPHIRE 2.5X15 (BALLOONS) ×2
BALLN SAPPHIRE ~~LOC~~ 3.25X18 (BALLOONS) ×2 IMPLANT
BALLN SAPPHIRE ~~LOC~~ 3.5X10 (BALLOONS) ×2 IMPLANT
BALLOON SAPPHIRE 2.5X15 (BALLOONS) ×1 IMPLANT
CATH LAUNCHER 6FR EBU3.5 (CATHETERS) ×2 IMPLANT
CATH LAUNCHER 6FR JR4 (CATHETERS) ×2 IMPLANT
CROWN DIAMONDBACK CLASSIC 1.25 (BURR) ×2 IMPLANT
DEVICE RAD COMP TR BAND LRG (VASCULAR PRODUCTS) ×2 IMPLANT
GLIDESHEATH SLEND SS 6F .021 (SHEATH) ×2 IMPLANT
GUIDEWIRE INQWIRE 1.5J.035X260 (WIRE) ×1 IMPLANT
INQWIRE 1.5J .035X260CM (WIRE) ×2
KIT ENCORE 26 ADVANTAGE (KITS) ×2 IMPLANT
KIT HEART LEFT (KITS) ×2 IMPLANT
LUBRICANT VIPERSLIDE CORONARY (MISCELLANEOUS) ×2 IMPLANT
PACK CARDIAC CATHETERIZATION (CUSTOM PROCEDURE TRAY) ×2 IMPLANT
STENT ONYX FRONTIER 2.75X30 (Permanent Stent) ×2 IMPLANT
STENT ONYX FRONTIER 3.0X15 (Permanent Stent) ×2 IMPLANT
STENT ONYX FRONTIER 3.0X26 (Permanent Stent) ×2 IMPLANT
TRANSDUCER W/STOPCOCK (MISCELLANEOUS) ×2 IMPLANT
TUBING CIL FLEX 10 FLL-RA (TUBING) ×2 IMPLANT
WIRE COUGAR XT STRL 190CM (WIRE) ×2 IMPLANT
WIRE VIPERWIRE COR FLEX .012 (WIRE) ×2 IMPLANT

## 2020-11-13 NOTE — Discharge Summary (Signed)
Discharge Summary for Same Day PCI   Patient ID: Patrick [name] MRN: [id number]; DOB: 1955-07-11  Admit date: 11/13/2020 Discharge date: 11/13/2020  Primary Care Provider: Libby Maw, MD  Primary Cardiologist: Berniece Salines, DO  Primary Electrophysiologist:  None   Discharge Diagnoses    Active Problems:   Angina pectoris [facility])    Diagnostic Studies/Procedures    Cardiac Catheterization 11/13/2020:   Prox RCA to Mid RCA lesion is 80% stenosed.   Dist RCA lesion is 40% stenosed.   Prox Cx to Mid Cx lesion is 90% stenosed.   A drug-eluting stent was successfully placed using a STENT ONYX FRONTIER 3.0X15.   A drug-eluting stent was successfully placed using a STENT ONYX FRONTIER 2.75X30.   Post intervention, there is a 0% residual stenosis.   Post intervention, there is a 0% residual stenosis.   1.  Successful atherectomy and stenting of severe stenosis in the right coronary artery using a 1.25 mm CSI classic crown for atherectomy at low speed followed by overlapping drug-eluting stent implantation 2.  Successful PTCA and stenting of severe stenosis in the circumflex treated with a 3.0 x 15 mm resolute DES   Recommend: Same-day PCI protocol as long as criteria met.  Dual antiplatelet therapy with aspirin and clopidogrel x12 months with multiple stents implanted.  Diagnostic Dominance: Right    Intervention    _____________   History of Present Illness     Patrick [name] is a 65 y.o. male with a PMH of moderate CAD from a heart catheterization which was done in 2008 at which time he had nonobstructive three-vessel coronary artery disease, hypertension, hyperlipidemia, TIA. He recently re-established care with cardiology in the setting of chest discomfort.  He described this as a midsternal burning sensation that he would get whenever he started to exert himself.  He denied any shortness of breath.  But he noted that he can almost always be certain that once he  started exerting himself he would have these feelings.  Also reported fatigue.   Given his symptoms, he was set up for outpatient cardiac cath on 7/29 showing severe 2v calcified CAD. He was started on plavix and set up for repeat cath with orbital atherectomy.   Hospital Course     The patient underwent cardiac cath as noted above with successful 2v atherectomy and stenting of severe stenosis in the RCA and Lcx. Plan for DAPT with ASA/plavix for at least one year. The patient was seen by cardiac rehab while in short stay. There were no observed complications post cath. Radial cath site was re-evaluated prior to discharge and found to be stable without any complications. Instructions/precautions regarding cath site care were given prior to discharge. He was continued on his home medications without significant change.   Patrick [name] was seen by Dr. Burt Knack and determined stable for discharge home. Follow up with our office has been arranged. Medications are listed below. Pertinent changes include n/a. Patient did request a 90 day supply of plavix, therefore was sent to [facility]  _____________  Cath/PCI Registry Performance & Quality Measures: Aspirin prescribed? - Yes ADP Receptor Inhibitor (Plavix/Clopidogrel, Brilinta/Ticagrelor or Effient/Prasugrel) prescribed (includes medically managed patients)? - Yes High Intensity Statin (Lipitor 40-18m or Crestor 20-470m prescribed? - Yes For EF <40%, was ACEI/ARB prescribed? - Not Applicable (EF >/= 4007%For EF <40%, Aldosterone Antagonist (Spironolactone or Eplerenone) prescribed? - Not Applicable (EF >/= 4037%Cardiac Rehab Phase II ordered (Included Medically managed Patients)? - Yes  _____________   Discharge Vitals Blood pressure 115/68, pulse 66, temperature 98 F (36.7 C), temperature source Oral, resp. rate 18, height _0  (1.803 m), weight 85.3 kg, SpO2 95 %.  Filed Weights   11/13/20 0552  Weight: 85.3 kg    Last Labs &  Radiologic Studies    CBC No results for input(s): WBC, NEUTROABS, HGB, HCT, MCV, PLT in the last 72 hours. Basic Metabolic Panel No results for input(s): NA, K, CL, CO2, GLUCOSE, BUN, CREATININE, CALCIUM, MG, PHOS in the last 72 hours. Liver Function Tests No results for input(s): AST, ALT, ALKPHOS, BILITOT, PROT, ALBUMIN in the last 72 hours. No results for input(s): LIPASE, AMYLASE in the last 72 hours. High Sensitivity Troponin:   No results for input(s): TROPONINIHS in the last 720 hours.  BNP Invalid input(s): POCBNP D-Dimer No results for input(s): DDIMER in the last 72 hours. Hemoglobin A1C No results for input(s): HGBA1C in the last 72 hours. Fasting Lipid Panel No results for input(s): CHOL, HDL, LDLCALC, TRIG, CHOLHDL, LDLDIRECT in the last 72 hours. Thyroid Function Tests No results for input(s): TSH, T4TOTAL, T3FREE, THYROIDAB in the last 72 hours.  Invalid input(s): FREET3 _____________  CARDIAC CATHETERIZATION  Result Date: 11/13/2020 Formatting of this result is different from the original.   Prox RCA to Mid RCA lesion is 80% stenosed.   Dist RCA lesion is 40% stenosed.   Prox Cx to Mid Cx lesion is 90% stenosed.   A drug-eluting stent was successfully placed using a STENT ONYX FRONTIER 3.0X15.   A drug-eluting stent was successfully placed using a STENT ONYX FRONTIER 2.75X30.   Post intervention, there is a 0% residual stenosis.   Post intervention, there is a 0% residual stenosis. 1.  Successful atherectomy and stenting of severe stenosis in the right coronary artery using a 1.25 mm CSI classic crown for atherectomy at low speed followed by overlapping drug-eluting stent implantation 2.  Successful PTCA and stenting of severe stenosis in the circumflex treated with a 3.0 x 15 mm resolute DES Recommend: Same-day PCI protocol as long as criteria met.  Dual antiplatelet therapy with aspirin and clopidogrel x12 months with multiple stents implanted.   CARDIAC  CATHETERIZATION  Result Date: 11/02/2020 Formatting of this result is different from the original.   Prox RCA to Mid RCA lesion is 80% stenosed.   Dist RCA lesion is 40% stenosed.   Prox Cx to Mid Cx lesion is 90% stenosed. 1.  Calcific multivessel coronary artery disease with: Patent left main with no stenosis Patent LAD with mild diffuse plaquing but no significant stenosis Severe hemodynamically significant calcific stenosis of the proximal left circumflex with an RFR value of 0.61 Severe hemodynamically significant calcific stenosis of the mid RCA with an RFR value of 0.78 2.  Low normal LV function by echo assessment with normal LVEDP Recommendations: Two-vessel PCI as indicated.  With the degree of calcification present, I think orbital atherectomy is indicated.  The patient will be preloaded with clopidogrel and scheduled for two-vessel atherectomy and stenting of the RCA and left circumflex.   VAS [place] AAA DUPLEX  Result Date: 10/31/2020 ABDOMINAL AORTA STUDY Patient Name:  JETTIE LAZARE [facility]  Date of Exam:   10/31/2020 Medical Rec #: [id number]           Accession #:    [id number] Date of Birth: Feb 09, 1956           Patient Gender: M Patient Age:   46Y Exam Location:  [facility]  Procedure:      VAS [place] AAA DUPLEX Referring Phys: [id number] KARDIE TOBB --------------------------------------------------------------------------------  Indications: Abdominal aortic aneurysm. Family h/o of abdominal aneurysms.              Patient denies any abdominal pain, Risk Factors: Hypertension, hyperlipidemia, past history of smoking, coronary               artery disease. Other Factors: TIA. Limitations: Air/bowel gas.  Comparison Study: NA Performing Technologist: Sharlett Iles RVT  Examination Guidelines: A complete evaluation includes B-mode imaging, spectral Doppler, color Doppler, and power Doppler as needed of all accessible portions of each vessel. Bilateral testing is considered an integral part of a complete  examination. Limited examinations for reoccurring indications may be performed as noted.  Abdominal Aorta Findings: +-------------+-------+----------+----------+----------------+--------+--------+ Location     AP (cm)Trans (cm)PSV (cm/s)Waveform        ThrombusComments +-------------+-------+----------+----------+----------------+--------+--------+ Proximal     2.20   2.20      74        triphasic                        +-------------+-------+----------+----------+----------------+--------+--------+ Mid          2.90   2.90      86        multiphasic                      +-------------+-------+----------+----------+----------------+--------+--------+ Distal       3.00   3.00      39        biphasic,               fusiform                                         dampened                         +-------------+-------+----------+----------+----------------+--------+--------+ RT CIA Prox  1.0    1.0       100       triphasic                        +-------------+-------+----------+----------+----------------+--------+--------+ RT CIA Mid                    82        biphasic                         +-------------+-------+----------+----------+----------------+--------+--------+ RT CIA Distal1.4    1.4       71        biphasic,                                                                dampened and  turbulent                        +-------------+-------+----------+----------+----------------+--------+--------+ RT EIA Prox  1.1    1.1       86        triphasic                        +-------------+-------+----------+----------+----------------+--------+--------+ LT CIA Prox  1.3    1.3       92        triphasic                        +-------------+-------+----------+----------+----------------+--------+--------+ LT CIA Mid                    95         multiphasic                      +-------------+-------+----------+----------+----------------+--------+--------+ LT CIA Distal                 129       triphasic                        +-------------+-------+----------+----------+----------------+--------+--------+ LT EIA Prox  1.0    1.0       85        triphasic                        +-------------+-------+----------+----------+----------------+--------+--------+ IVC/Iliac Findings: +--------+------+--------+--------+   IVC   PatentThrombusComments +--------+------+--------+--------+ IVC Proxpatent                 +--------+------+--------+--------+   Summary: Abdominal Aorta: Aortoiliac atherosclerosis. There is evidence of abnormal dilatation of the distal Abdominal aorta. The largest aortic measurement is 3.0 cm. Diameter increase noted in the right distal common iliac artery, measuring 1.4 cm from 1.0 cm seen in the proximal segment. No previous exam available for comparison. Stenosis: Widely patent bilateral common and external iliac arteries without evidence of stenosis. IVC/Iliac: Patent IVC.  *See table(s) above for measurements and observations. Suggest follow up study in 24 months.  Electronically signed by Quay Burow MD on 10/31/2020 at 3:14:48 PM.    Final    ECHOCARDIOGRAM COMPLETE  Result Date: 10/31/2020    ECHOCARDIOGRAM REPORT   Patient Name:   MACE WEINBERG [facility] Date of Exam: 10/31/2020 Medical Rec #:  [id number]          Height:       71.0 in Accession #:    [id number]         Weight:       190.4 lb Date of Birth:  1955-04-18          BSA:          2.065 m Patient Age:    69 years           BP:           116/70 mmHg Patient Gender: M                  HR:           52 bpm. Exam Location:  [facility] Procedure: 2D Echo, Cardiac Doppler and Color Doppler Indications:    R53.83 Fatigue  History:        Patient has prior history of Echocardiogram examinations, most  recent 10/13/2006. CAD, TIA;  Risk Factors:Hypertension and                 Dyslipidemia. Subarachnoid hemorrhage. Abdominal aortic                 aneurysm. Overweight.  Sonographer:    Diamond Nickel RCS Referring Phys: [id number] [place]  1. Left ventricular ejection fraction, by estimation, is 50 to 55%. The left ventricle has low normal function. The left ventricle has no regional wall motion abnormalities. There is mild asymmetric left ventricular hypertrophy. Left ventricular diastolic parameters were normal.  2. Right ventricular systolic function is normal. The right ventricular size is normal. Tricuspid regurgitation signal is inadequate for assessing PA pressure.  3. The mitral valve is grossly normal. No evidence of mitral valve regurgitation. No evidence of mitral stenosis.  4. The aortic valve is tricuspid. There is mild calcification of the aortic valve. There is mild thickening of the aortic valve. Aortic valve regurgitation is not visualized. No aortic stenosis is present.  5. The inferior vena cava is normal in size with greater than 50% respiratory variability, suggesting right atrial pressure of 3 mmHg. Comparison(s): A prior study was performed on 07/20/2006. Prior images reviewed side by side. LV is slightly less vigorous from prior. FINDINGS  Left Ventricle: Left ventricular ejection fraction, by estimation, is 50 to 55%. The left ventricle has low normal function. The left ventricle has no regional wall motion abnormalities. The left ventricular internal cavity size was normal in size. There is mild asymmetric left ventricular hypertrophy. Left ventricular diastolic parameters were normal. Right Ventricle: The right ventricular size is normal. No increase in right ventricular wall thickness. Right ventricular systolic function is normal. Tricuspid regurgitation signal is inadequate for assessing PA pressure. Left Atrium: Left atrial size was normal in size. Right Atrium: Right atrial size was normal in size.  Pericardium: There is no evidence of pericardial effusion. Mitral Valve: The mitral valve is grossly normal. No evidence of mitral valve regurgitation. No evidence of mitral valve stenosis. Tricuspid Valve: The tricuspid valve is normal in structure. Tricuspid valve regurgitation is trivial. No evidence of tricuspid stenosis. Aortic Valve: The aortic valve is tricuspid. There is mild calcification of the aortic valve. There is mild thickening of the aortic valve. There is mild aortic valve annular calcification. Aortic valve regurgitation is not visualized. No aortic stenosis  is present. Pulmonic Valve: The pulmonic valve was grossly normal. Pulmonic valve regurgitation is mild. No evidence of pulmonic stenosis. Aorta: The aortic root and ascending aorta are structurally normal, with no evidence of dilitation. Venous: The inferior vena cava is normal in size with greater than 50% respiratory variability, suggesting right atrial pressure of 3 mmHg. IAS/Shunts: The atrial septum is grossly normal.  LEFT VENTRICLE PLAX 2D LVIDd:         5.10 cm  Diastology LVIDs:         3.80 cm  LV e' medial:    8.88 cm/s LV PW:         0.80 cm  LV E/e' medial:  8.5 LV IVS:        1.10 cm  LV e' lateral:   11.30 cm/s LVOT diam:     1.95 cm  LV E/e' lateral: 6.7 LV SV:         61 LV SV Index:   29 LVOT Area:     2.99 cm  RIGHT VENTRICLE RV Basal diam:  1.80 cm RV S prime:  10.90 cm/s TAPSE (M-mode): 2.2 cm LEFT ATRIUM           Index       RIGHT ATRIUM           Index LA diam:      3.60 cm 1.74 cm/m  RA Area:     13.50 cm LA Vol (A2C): 57.6 ml 27.89 ml/m RA Volume:   27.10 ml  13.12 ml/m LA Vol (A4C): 29.9 ml 14.48 ml/m  AORTIC VALVE LVOT Vmax:   76.40 cm/s LVOT Vmean:  51.200 cm/s LVOT VTI:    0.203 m  AORTA Ao Root diam: 3.10 cm MITRAL VALVE MV Area (PHT): 3.95 cm    SHUNTS MV Decel Time: 192 msec    Systemic VTI:  0.20 m MV E velocity: 75.40 cm/s  Systemic Diam: 1.95 cm MV A velocity: 70.30 cm/s MV E/A ratio:  1.07 Rudean Haskell MD Electronically signed by Rudean Haskell MD Signature Date/Time: 10/31/2020/12:14:45 PM    Final     Disposition   Pt is being discharged home today in good condition.  Follow-up Plans & Appointments     Follow-up Information     Tobb, Godfrey Pick, DO Follow up on 12/12/2020.   Specialty: Cardiology Why: a 10:20am for your follow up appt Contact information: [address] [place] [address] [phone]                Discharge Instructions     AMB Referral to Cardiac Rehabilitation - Phase II   Complete by: As directed    Diagnosis: Coronary Stents   After initial evaluation and assessments completed: Virtual Based Care may be provided alone or in conjunction with Phase 2 Cardiac Rehab based on patient barriers.: Yes        Discharge Medications   Allergies as of 11/13/2020   No Active Allergies      Medication List     STOP taking these medications    D3 2000 PO   ONE-A-DAY MENS (MINERALS) PO       TAKE these medications    amLODipine 5 MG tablet Commonly known as: NORVASC Take 1 tablet (5 mg total) by mouth daily.   clopidogrel 75 MG tablet Commonly known as: Plavix Take 1 tablet (75 mg total) by mouth daily.   Co Q-10 300 MG Caps Take 300 mg by mouth daily.   ezetimibe 10 MG tablet Commonly known as: Zetia Take 1 tablet (10 mg total) by mouth daily.   finasteride 5 MG tablet Commonly known as: PROSCAR Take 5 mg by mouth daily.   isosorbide mononitrate 30 MG 24 hr tablet Commonly known as: IMDUR Take 0.5 tablets (15 mg total) by mouth daily.   nitroGLYCERIN 0.4 MG SL tablet Commonly known as: NITROSTAT Place 1 tablet (0.4 mg total) under the tongue every 5 (five) minutes as needed for chest pain.   rosuvastatin 40 MG tablet Commonly known as: CRESTOR Take 1 tablet (40 mg total) by mouth every evening.       ASK your doctor about these medications    tamsulosin 0.4 MG Caps  capsule Commonly known as: FLOMAX Take 1 capsule (0.4 mg total) by mouth daily.        Allergies No Active Allergies  Outstanding Labs/Studies   N/a   Duration of Discharge Encounter   Greater than 30 minutes including physician time.  Signed, Reino Bellis, NP 11/13/2020, 2:16 PM

## 2020-11-13 NOTE — Progress Notes (Signed)
CARDIAC REHAB PHASE I   Discussed with pt and wife stents, Plavix importance, restrictions, diet, exercise, NTG, and CRPII. Pt voiced understanding and requests his referral be sent to [facility]. Very receptive with appropriate questions.  [place], ACSM 11/13/2020 2:13 PM

## 2020-11-13 NOTE — H&P (Signed)
Cardiology Office Note:    Date:  10/11/2020    ID:  Patrick [name], DOB Jan 30, 1956, MRN [id number]   PCP:  Libby Maw, MD      Cardiologist:  Berniece Salines, DO  Electrophysiologist:  None    Referring MD: Libby Maw    I have been having some chest pain which may not be nothing   History of Present Illness:     Patrick [name] is a 65 y.o. male with a hx of moderate coronary disease from a heart catheterization which was done in 2008 at which time he had nonobstructive three-vessel coronary artery disease, hypertension, hyperlipidemia, TIA is here today to establish cardiac care.  The patient tells me that he was encourage by his primary care doctor to reestablish care with cardiology as he has been experiencing intermittent chest discomfort.  He describes this as a midsternal burning sensation that he gets whenever he starts to exert himself.  He denies any shortness of breath.  But he notes that he can almost always be certain that once he started exerting himself he would have these feelings.  He has not had any radiation to the pain is mostly midsternal.  Quantify this about a 5 out of 10.  He denies any shortness of breath.  But admit to fatigue.   Of note he also tells me that he had some urological work done recently and he was told by the urologist that he has abdominal aortic aneurysm.  He is concerned about this because his grandfather and both his father had an aneurysm.       Past Medical History:  Diagnosis Date   Coronary artery disease      moderate   Hyperlipidemia     TIA (transient ischemic attack)             Past Surgical History:  Procedure Laterality Date   MANDIBULAR HARDWARE REMOVAL N/A 02/21/2016    Procedure: MANDIBULAR HARDWARE REMOVAL;  Surgeon: Wallace Going, DO;  Location: [place];  Service: Plastics;  Laterality: N/A;   ORIF MANDIBULAR FRACTURE N/A 01/07/2016    Procedure: OPEN REDUCTION INTERNAL  FIXATION (ORIF) MANDIBULAR FRACTURE;  Surgeon: Loel Lofty Dillingham, DO;  Location: [place];  Service: Plastics;  Laterality: N/A;   PLACEMENT OF LUMBAR DRAIN N/A 01/14/2016    Procedure: PLACEMENT OF LUMBAR DRAIN;  Surgeon: Newman Pies, MD;  Location: [facility];  Service: Neurosurgery;  Laterality: N/A;      Current Medications: Active Medications      Current Meds  Medication Sig   amLODipine (NORVASC) 5 MG tablet Take by mouth.   Cholecalciferol (D3 2000 PO) Take 1 tablet by mouth daily.   Coenzyme Q10 (CO Q-10) 300 MG CAPS Take 1 capsule by mouth daily.   ezetimibe (ZETIA) 10 MG tablet Take 1 tablet (10 mg total) by mouth daily.   finasteride (PROSCAR) 5 MG tablet Take 5 mg by mouth daily.   isosorbide mononitrate (IMDUR) 30 MG 24 hr tablet Take 0.5 tablets (15 mg total) by mouth daily.   Multiple Vitamins-Minerals (ONE-A-DAY MENS, MINERALS, PO) Take 1 tablet by mouth daily.   nitroGLYCERIN (NITROSTAT) 0.4 MG SL tablet Place 1 tablet (0.4 mg total) under the tongue every 5 (five) minutes as needed for chest pain.   rosuvastatin (CRESTOR) 40 MG tablet Take by mouth.   tamsulosin (FLOMAX) 0.4 MG CAPS capsule Take 1 capsule (0.4 mg total) by mouth daily. (Patient taking differently: Take 0.4  mg by mouth daily. Take 2 capsules daily)        Allergies:   Acetaminophen    Social History         Socioeconomic History   Marital status: Married      Spouse name: Not on file   Number of children: Not on file   Years of education: Not on file   Highest education level: Not on file  Occupational History   Not on file  Tobacco Use   Smoking status: Former      Pack years: 0.00      Types: Cigarettes      Quit date: 01/06/1995      Years since quitting: 25.7   Smokeless tobacco: Never  Substance and Sexual Activity   Alcohol use: Yes   Drug use: No   Sexual activity: Not on file  Other Topics Concern   Not on file  Social History Narrative   Not on file    Social Determinants of  Health    Financial Resource Strain: Not on file  Food Insecurity: Not on file  Transportation Needs: Not on file  Physical Activity: Not on file  Stress: Not on file  Social Connections: Not on file      Family History: The patient's family history includes Cancer in his mother; Heart disease in his father.   ROS:   Review of Systems Constitution: Negative for decreased appetite, fever and weight gain. HENT: Negative for congestion, ear discharge, hoarse voice and sore throat.   Eyes: Negative for discharge, redness, vision loss in right eye and visual halos. Cardiovascular: Reports chest pain,.  Negative for dyspnea on exertion, leg swelling, orthopnea and palpitations. Respiratory: Negative for cough, hemoptysis, shortness of breath and snoring.   Endocrine: Negative for heat intolerance and polyphagia. Hematologic/Lymphatic: Negative for bleeding problem. Does not bruise/bleed easily. Skin: Negative for flushing, nail changes, rash and suspicious lesions. Musculoskeletal: Negative for arthritis, joint pain, muscle cramps, myalgias, neck pain and stiffness. Gastrointestinal: Negative for abdominal pain, bowel incontinence, diarrhea and excessive appetite. Genitourinary: Negative for decreased libido, genital sores and incomplete emptying. Neurological: Negative for brief paralysis, focal weakness, headaches and loss of balance. Psychiatric/Behavioral: Negative for altered mental status, depression and suicidal ideas. Allergic/Immunologic: Negative for HIV exposure and persistent infections.     EKGs/Labs/Other Studies Reviewed:     The following studies were reviewed today:     EKG:  The ekg ordered today demonstrates sinus bradycardia, heart rate 56 bpm   Recent Labs: 09/13/2020: ALT 15; BUN 15; Creatinine, Ser 0.99; Hemoglobin 13.7; Platelets 171.0; Potassium 4.0; Sodium 140  Recent Lipid Panel Labs (Brief)          Component Value Date/Time    CHOL 167 09/13/2020 1008     TRIG 138.0 09/13/2020 1008    HDL 41.00 09/13/2020 1008    CHOLHDL 4 09/13/2020 1008    VLDL 27.6 09/13/2020 1008    LDLCALC 98 09/13/2020 1008    LDLDIRECT 110.0 09/13/2020 1008        Physical Exam:    VS:  BP 116/70 (BP Location: Left Arm, Patient Position: Sitting)   Pulse (!) 56   Ht '5\' 11"'$  (1.803 m)   Wt 190 lb 6.4 oz (86.4 kg)   SpO2 97%   BMI 26.56 kg/m         Wt Readings from Last 3 Encounters:  10/11/20 190 lb 6.4 oz (86.4 kg)  09/12/20 190 lb 12.8 oz (86.5 kg)  10/21/19  187 lb 11.2 oz (85.1 kg)      GEN: Well nourished, well developed in no acute distress HEENT: Normal NECK: No JVD; No carotid bruits LYMPHATICS: No lymphadenopathy CARDIAC: S1S2 noted,RRR, no murmurs, rubs, gallops RESPIRATORY:  Clear to auscultation without rales, wheezing or rhonchi ABDOMEN: Soft, non-tender, non-distended, +bowel sounds, no guarding. EXTREMITIES: No edema, No cyanosis, no clubbing MUSCULOSKELETAL:  No deformity SKIN: Warm and dry NEUROLOGIC:  Alert and oriented x 3, non-focal PSYCHIATRIC:  Normal affect, good insight   ASSESSMENT:     1. Hypertension, unspecified type  2. Coronary artery disease involving native coronary artery of native heart without angina pectoris  3. Other fatigue  4. Mixed hyperlipidemia  5. Overweight  6. AAA (abdominal aortic aneurysm) without rupture (HCC)     PLAN:     I am concerned about his symptoms and given his history of nonobstructive moderate coronary artery disease I like to proceed with a left heart catheterization in this patient.  I talked to the patient about this testing and he understands that risks include but are not limited to stroke (1 in 1000), death (1 in 28), kidney failure [usually temporary] (1 in 500), bleeding (1 in 200), allergic reaction [possibly serious] (1 in 200), and agrees to proceed.   In the meantime I am going to start him on Imdur 50 mg daily to help with his symptoms.   Given the report of  abdominal aortic aneurysm I like to get a abdominal ultrasound to assess, in addition he is a former smoker in at age 55 she will get a screening AAA testing anyway.   His generalized fatigue we will get an echocardiogram to assess LVEF as well as RV function and any other structural abnormalities.   Blood pressure is acceptable, continue with current antihypertensive regimen.   I reviewed his most recent lipid profile which was done in June 2022 showing HDL 41, LDL 98, total cholesterol 167, triglyceride 138 he was recently started on Zetia 10 mg in addition to his Crestor after his blood work.  His target is less than 70.  We will repeat this in [redacted] weeks along with LP(a) to determine the need for any benefit of PCSK9 inhibitors.   The patient is in agreement with the above plan. The patient left the office in stable condition.  The patient will follow up in 4 to 6 weeks post cath  ADDENDUM: The patient is independently interviewed and evaluated.  Diagnostic cardiac catheterization was performed by me 11/02/2020.  The patient was found to have severe calcific two-vessel coronary obstructive disease confirmed by pressure wire analysis.  He has CCS functional class II symptoms of atypical angina, treated with 2 antianginal drugs (isosorbide and amlodipine), with plans noted for orbital atherectomy and stenting of the left circumflex and right coronary arteries today.  I have reviewed my plan in detail with the patient and his wife.  He is tolerating clopidogrel and took his last dose this morning.  All of his questions were answered.  We plan to proceed with PCI today as above.  Sherren Mocha 11/13/2020 8:16 AM

## 2020-11-14 ENCOUNTER — Encounter (HOSPITAL_COMMUNITY): Payer: Self-pay | Admitting: Cardiovascular Disease

## 2020-11-14 MED FILL — Nitroglycerin IV Soln 100 MCG/ML in D5W: INTRA_ARTERIAL | Qty: 10 | Status: AC

## 2020-11-14 MED FILL — Heparin Sod (Porcine)-NaCl IV Soln 1000 Unit/500ML-0.9%: INTRAVENOUS | Qty: 500 | Status: AC

## 2020-11-15 ENCOUNTER — Ambulatory Visit: Payer: PPO

## 2020-11-16 ENCOUNTER — Telehealth (HOSPITAL_COMMUNITY): Payer: Self-pay

## 2020-11-16 NOTE — Telephone Encounter (Signed)
Per phase I cardiac rehab, fax cardiac rehab referral to [facility]. 

## 2020-11-22 ENCOUNTER — Telehealth: Payer: Self-pay | Admitting: Medical

## 2020-11-22 NOTE — Telephone Encounter (Signed)
   Notified by Doug Sou, clinical quality coordinator, that aspirin was not listed on patients discharge med rec following coronary atherectomy with DES x2 to Lcx and RCA 11/13/20. I called Mr. Eilertson who confirms he has been taking a baby aspirin since that time. I have updated his medication list to reflect this. He was encouraged to continue taking aspirin and plavix uninterrupted. He plans to see pharmacy tomorrow for PCSK9-inhibitor consideration and will follow-up with Dr. Harriet Masson 12/12/20. He was appreciative of the call.   Abigail Butts, PA-C 11/22/20; 3:01 PM

## 2020-11-23 ENCOUNTER — Ambulatory Visit (INDEPENDENT_AMBULATORY_CARE_PROVIDER_SITE_OTHER): Payer: PPO | Admitting: Pharmacist

## 2020-11-23 ENCOUNTER — Other Ambulatory Visit: Payer: Self-pay

## 2020-11-23 ENCOUNTER — Telehealth: Payer: Self-pay | Admitting: Pharmacist

## 2020-11-23 DIAGNOSIS — I2583 Coronary atherosclerosis due to lipid rich plaque: Secondary | ICD-10-CM | POA: Diagnosis not present

## 2020-11-23 DIAGNOSIS — I2510 Atherosclerotic heart disease of native coronary artery without angina pectoris: Secondary | ICD-10-CM | POA: Diagnosis not present

## 2020-11-23 DIAGNOSIS — E785 Hyperlipidemia, unspecified: Secondary | ICD-10-CM

## 2020-11-23 MED ORDER — REPATHA SURECLICK 140 MG/ML ~~LOC~~ SOAJ: 1.0000 "pen " | SUBCUTANEOUS | 3 refills | Status: DC

## 2020-11-23 NOTE — Progress Notes (Signed)
Patient ID: Patrick [name]                 DOB: [birth year]                    MRN: [id number]     HPI: Patrick [name] is a 65 y.o. male patient referred to lipid clinic by Dr. Harriet Masson. PMH is significant for moderate CAD from a heart catheterization which was done in 2008 at which time he had nonobstructive three-vessel coronary artery disease, hypertension, hyperlipidemia, TIA. He recently had chest discomfort which prompted a outpatient cath. He underwent cath with successful 2v atherectomy and stenting of severe stenosis in the RCA and Lcx.  Patient presents today to the lipid clinic. He asks a lot of great questions about his elevated LPa. He has health team advantage. Cost for Repatha would be $90/90 ds  Current Medications: rosuvastatin '40mg'$  daily, zetia '10mg'$  daily Intolerances:  Risk Factors: progressive ASCVD, TIA, high LPa LDL goal: <55  Diet:  breakfast: bowl of oats w/ almond milk and banana, maybe a little bit of local honey Lunch: seafood, vegetables, chicken Dinner: soup Snacks: nuts Drink: water, 1 cup black coffee  Exercise: hiking, walks 1.5 miles everyday  Family History:  Family History  Problem Relation Age of Onset   Cancer Mother    Heart disease Father     Social History: 2 beer only on Friday and sat night, former smoker  Labs: 10/24/20 LPa 532 09/13/20 LDL direct 110 09/13/20 TC 167, TG 138, HDL 41  Past Medical History:  Diagnosis Date   Coronary artery disease    moderate   Hyperlipidemia    TIA (transient ischemic attack)     Current Outpatient Medications on File Prior to Visit  Medication Sig Dispense Refill   amLODipine (NORVASC) 5 MG tablet Take 1 tablet (5 mg total) by mouth daily. 90 tablet 0   aspirin EC 81 MG tablet Take 81 mg by mouth daily. Swallow whole.     clopidogrel (PLAVIX) 75 MG tablet Take 1 tablet (75 mg total) by mouth daily. 90 tablet 2   Coenzyme Q10 (CO Q-10) 300 MG CAPS Take 300 mg by mouth daily.     ezetimibe  (ZETIA) 10 MG tablet Take 1 tablet (10 mg total) by mouth daily. 90 tablet 3   finasteride (PROSCAR) 5 MG tablet Take 5 mg by mouth daily.     isosorbide mononitrate (IMDUR) 30 MG 24 hr tablet Take 0.5 tablets (15 mg total) by mouth daily. 45 tablet 3   nitroGLYCERIN (NITROSTAT) 0.4 MG SL tablet Place 1 tablet (0.4 mg total) under the tongue every 5 (five) minutes as needed for chest pain. 90 tablet 3   rosuvastatin (CRESTOR) 40 MG tablet Take 1 tablet (40 mg total) by mouth every evening. 90 tablet 0   tamsulosin (FLOMAX) 0.4 MG CAPS capsule Take 1 capsule (0.4 mg total) by mouth daily. (Patient taking differently: Take 0.4 mg by mouth 2 (two) times daily.) 30 capsule 0   No current facility-administered medications on file prior to visit.    No Active Allergies  Assessment/Plan:  1. Hyperlipidemia - LDL is above goal of <55. LPa very elevated. We did discuss that [facility] has an LPa trial. I will send patient's name to research center to see if he qualifies. Also discussed PCSK9i- LDL and LPa lowering, side effects, cost and injection technique. Will submit PA for Repatha. I will call patient once approved. Patient uses [facility]. Insurance is cheaper to send in 64 DS. Will schedule labs once med is approved. Continue rosuvastatin '40mg'$  and zetia '10mg'$  daily.    Thank you,   Ramond Dial, Pharm.D, BCPS, CPP [facility]  [id number], [place], [place] [address]  Phone: [phone]; Fax: [phone]

## 2020-11-23 NOTE — Telephone Encounter (Signed)
Repatha approved through 02/21/21. Rx sent to [facility]. Pt made aware. He will get labs done at [facility]. I will call him in 2 months to remind him.

## 2020-11-23 NOTE — Patient Instructions (Addendum)
We will submit a prior authorization for Repatha  Please continue rosuvastatin '40mg'$  daily and zetia '10mg'$  daily.  Please call me at [phone] with any questions

## 2020-11-26 NOTE — Telephone Encounter (Signed)
Pt called requesting Dr Ethelene Hal be the one to refill his amlodipine Rx.

## 2020-11-26 NOTE — Telephone Encounter (Signed)
Refill sent in last month patient states that he will call the pharmacy to see if they have it and give [place] a call back if needed.

## 2020-11-26 NOTE — Addendum Note (Signed)
Addended by: Marcelle Overlie D on: 11/26/2020 12:41 PM   Modules accepted: Orders

## 2020-12-04 ENCOUNTER — Other Ambulatory Visit: Payer: Self-pay

## 2020-12-04 DIAGNOSIS — Z006 Encounter for examination for normal comparison and control in clinical research program: Secondary | ICD-10-CM

## 2020-12-04 NOTE — Research (Signed)
Subject # [id number] [employer] Lp(a) [id number] Site # [phone]  SEX _0    Male                      _1    Male  Ethnicity _2   Hispanic or Latino   _3   Not Hispanic or Latino  Race _4   White                 _5   Black or African American  _6   Asian _7   American [place] or [place] Native            _8   Native Hawaiian or Other Pacific Islander                      _9   Other  Other   Age 65  Subject Group _10    Local Lab           _11   Historical Lp(a) value                                       Results: 532.5  Future research _12    Yes                    _13   No    [employer] [id number] Site # [id number] Subject ID #         ELIGIBILITY CRITERIA WORKSHEET INCLUSION CRITERIA   Subject has provided informed consent prior to the initiation of any study specific activities/procedures _14   Age 44 to 15 years _15   MI (presumed type 1) OR _16   PCI (with high-risk features) with at least 1 of the following: _17   Age >65 _18   Diabetes mellitus  HbA1c: _19   History of ischemic stroke _20   History of peripheral arterial disease _21   Residual stenosis ? 50% _22   Multivessel PCI (ie, ? 2 vessels, including branch arteries _23   EXCLUSIONS THE FOLLOWING N/A _24   Subjects known to be currently receiving investigational drug in a clinical study that is anticipated to last > 1 year _25   Known Lp(a) value <50m/dL or < 200nmol/L _26   Subject has a diagnosis of end-stage renal disease or requires dialysis. _27   Poorly controlled (glycated hemoglobin [HbA1c] > 10%) diabetes mellitus (type 1 or type 2) _28   Subject is receiving or has received lipoprotein apheresis to reduce Lp(a) within 3 months prior to enrollment. _29   Known uncontrolled or recurrent ventricular tachycardia in the past 3 months prior to enrollment. _30   Known malignancy (except non-melanoma skin cancers, cervical in situ carcinoma, breast ductal carcinoma in situ, or stage 1 prostate carcinoma) within the last 5 years prior to enrollment. _31   Known history or  evidence of clinically significant disease (eg, respiratory, gastrointestinal, or psychiatric disease) or unstable disorder or biomarker that, in the opinion of the investigator(s), would result in life expectancy < 5 years. _32   Known hemorrhagic stroke. _33    [employer] Lp(a) Informed Consent   Subject Name: Patrick [name]  Subject met inclusion and exclusion criteria.  The informed consent form, study requirements and expectations were reviewed with the subject and questions and concerns were addressed prior to the signing of the consent form.  The subject verbalized understanding of the trial requirements.  The subject agreed to participate in the [employer] [facility] trial and signed the informed consent at 1036 on 12/04/2020.  The informed consent was obtained prior to  performance of any protocol-specific procedures for the subject.  A copy of the signed informed consent was given to the subject and a copy was placed in the subject's medical record.   Patrick [name]  [employer] Consent Version 2 Protocol Version 2

## 2020-12-12 ENCOUNTER — Encounter: Payer: Self-pay | Admitting: Cardiology

## 2020-12-12 ENCOUNTER — Ambulatory Visit (INDEPENDENT_AMBULATORY_CARE_PROVIDER_SITE_OTHER): Payer: PPO | Admitting: Cardiology

## 2020-12-12 ENCOUNTER — Other Ambulatory Visit: Payer: Self-pay

## 2020-12-12 VITALS — BP 114/64 | HR 70 | Ht 71.0 in | Wt 189.0 lb

## 2020-12-12 DIAGNOSIS — I2510 Atherosclerotic heart disease of native coronary artery without angina pectoris: Secondary | ICD-10-CM | POA: Diagnosis not present

## 2020-12-12 DIAGNOSIS — I10 Essential (primary) hypertension: Secondary | ICD-10-CM

## 2020-12-12 DIAGNOSIS — E785 Hyperlipidemia, unspecified: Secondary | ICD-10-CM | POA: Diagnosis not present

## 2020-12-12 NOTE — Progress Notes (Signed)
Cardiology Office Note:    Date:  12/12/2020   ID:  Marnette Burgess Norton, DOB 18-Oct-1955, MRN [id number]  PCP:  Libby Maw, MD   [facility] Providers Cardiologist:  Berniece Salines, DO     Referring MD: Libby Maw,*     History of Present Illness:    Patrick [name] is a 65 y.o. male with a hx of coronary artery disease status post PCI to the RCA and circumflex with atherectomy in the RCA as well on dual antiplatelet therapy with aspirin and Plavix, hyperlipidemia now on PCSK9 inhibitors, history of TIA is here today for follow-up visit.  I initially saw the patient on October 11, 2020 at that time he was experiencing intermittent chest discomfort and given his history of coronary artery disease I recommended he undergo a left heart catheterization.  He is now status post 2 drug-eluting stent which was placed on November 13, 2020.  Today he offers no complaints.  He tells me that he is happy that he presented to get evaluated.  No chest pain.  Past Medical History:  Diagnosis Date   Coronary artery disease    moderate   Hyperlipidemia    TIA (transient ischemic attack)     Past Surgical History:  Procedure Laterality Date   CORONARY ATHERECTOMY N/A 11/13/2020   Procedure: CORONARY ATHERECTOMY;  Surgeon: Sherren Mocha, MD;  Location: [place] [facility];  Service: Cardiovascular;  Laterality: N/A;   INTRAVASCULAR PRESSURE WIRE/FFR STUDY N/A 11/02/2020   Procedure: INTRAVASCULAR PRESSURE WIRE/FFR STUDY;  Surgeon: Sherren Mocha, MD;  Location: [place] [facility];  Service: Cardiovascular;  Laterality: N/A;   LEFT HEART CATH AND CORONARY ANGIOGRAPHY N/A 11/02/2020   Procedure: LEFT HEART CATH AND CORONARY ANGIOGRAPHY;  Surgeon: Sherren Mocha, MD;  Location: [place] [facility];  Service: Cardiovascular;  Laterality: N/A;   MANDIBULAR HARDWARE REMOVAL N/A 02/21/2016   Procedure: MANDIBULAR HARDWARE REMOVAL;  Surgeon: Wallace Going, DO;  Location: [facility];  Service: Plastics;  Laterality: N/A;   ORIF MANDIBULAR FRACTURE N/A 01/07/2016   Procedure: OPEN REDUCTION INTERNAL FIXATION (ORIF) MANDIBULAR FRACTURE;  Surgeon: Loel Lofty Dillingham, DO;  Location: [facility];  Service: Plastics;  Laterality: N/A;   PLACEMENT OF LUMBAR DRAIN N/A 01/14/2016   Procedure: PLACEMENT OF LUMBAR DRAIN;  Surgeon: Newman Pies, MD;  Location: [place];  Service: Neurosurgery;  Laterality: N/A;    Current Medications: Current Meds  Medication Sig   amLODipine (NORVASC) 5 MG tablet Take 1 tablet (5 mg total) by mouth daily.   aspirin EC 81 MG tablet Take 81 mg by mouth daily. Swallow whole.   clopidogrel (PLAVIX) 75 MG tablet Take 1 tablet (75 mg total) by mouth daily.   Evolocumab (REPATHA SURECLICK) [id number] MG/ML SOAJ Inject 1 pen into the skin every 14 (fourteen) days.   ezetimibe (ZETIA) 10 MG tablet Take 1 tablet (10 mg total) by mouth daily.   finasteride (PROSCAR) 5 MG tablet Take 5 mg by mouth daily.   nitroGLYCERIN (NITROSTAT) 0.4 MG SL tablet Place 1 tablet (0.4 mg total) under the tongue every 5 (five) minutes as needed for chest pain.   rosuvastatin (CRESTOR) 40 MG tablet Take 1 tablet (40 mg total) by mouth every evening.   tamsulosin (FLOMAX) 0.4 MG CAPS capsule Take 1 capsule (0.4 mg total) by mouth daily. (Patient taking differently: Take 0.4 mg by mouth 2 (two) times daily.)     Allergies:   Patient has no known allergies.  Social History   Socioeconomic History   Marital status: Married    Spouse name: Not on file   Number of children: Not on file   Years of education: Not on file   Highest education level: Not on file  Occupational History   Not on file  Tobacco Use   Smoking status: Former    Types: Cigarettes    Quit date: 01/06/1995    Years since quitting: 25.9   Smokeless tobacco: Never  Substance and Sexual Activity   Alcohol use: Yes   Drug use: No   Sexual activity: Not on file  Other Topics Concern   Not on file   Social History Narrative   Not on file   Social Determinants of Health   Financial Resource Strain: Not on file  Food Insecurity: Not on file  Transportation Needs: Not on file  Physical Activity: Not on file  Stress: Not on file  Social Connections: Not on file     Family History: The patient's family history includes Cancer in his mother; Heart disease in his father.  ROS:   Please see the history of present illness.     All other systems reviewed and are negative.  EKGs/Labs/Other Studies Reviewed:    The following studies were reviewed today: Transthoracic echocardiogram performed on October 31, 2020 IMPRESSIONS     1. Left ventricular ejection fraction, by estimation, is 50 to 55%. The  left ventricle has low normal function. The left ventricle has no regional  wall motion abnormalities. There is mild asymmetric left ventricular  hypertrophy. Left ventricular  diastolic parameters were normal.   2. Right ventricular systolic function is normal. The right ventricular  size is normal. Tricuspid regurgitation signal is inadequate for assessing  PA pressure.   3. The mitral valve is grossly normal. No evidence of mitral valve  regurgitation. No evidence of mitral stenosis.   4. The aortic valve is tricuspid. There is mild calcification of the  aortic valve. There is mild thickening of the aortic valve. Aortic valve  regurgitation is not visualized. No aortic stenosis is present.   5. The inferior vena cava is normal in size with greater than 50%  respiratory variability, suggesting right atrial pressure of 3 mmHg.   Comparison(s): A prior study was performed on 07/20/2006. Prior images  reviewed side by side. LV is slightly less vigorous from prior.   FINDINGS   Left Ventricle: Left ventricular ejection fraction, by estimation, is 50  to 55%. The left ventricle has low normal function. The left ventricle has  no regional wall motion abnormalities. The left ventricular  internal  cavity size was normal in size.  There is mild asymmetric left ventricular hypertrophy. Left ventricular  diastolic parameters were normal.   Right Ventricle: The right ventricular size is normal. No increase in  right ventricular wall thickness. Right ventricular systolic function is  normal. Tricuspid regurgitation signal is inadequate for assessing PA  pressure.   Left Atrium: Left atrial size was normal in size.   Right Atrium: Right atrial size was normal in size.   Pericardium: There is no evidence of pericardial effusion.   Mitral Valve: The mitral valve is grossly normal. No evidence of mitral  valve regurgitation. No evidence of mitral valve stenosis.   Tricuspid Valve: The tricuspid valve is normal in structure. Tricuspid  valve regurgitation is trivial. No evidence of tricuspid stenosis.   Aortic Valve: The aortic valve is tricuspid. There is mild calcification  of the  aortic valve. There is mild thickening of the aortic valve. There  is mild aortic valve annular calcification. Aortic valve regurgitation is  not visualized. No aortic stenosis   is present.   Pulmonic Valve: The pulmonic valve was grossly normal. Pulmonic valve  regurgitation is mild. No evidence of pulmonic stenosis.   Aorta: The aortic root and ascending aorta are structurally normal, with  no evidence of dilitation.   Venous: The inferior vena cava is normal in size with greater than 50%  respiratory variability, suggesting right atrial pressure of 3 mmHg.   IAS/Shunts: The atrial septum is grossly normal.   EKG:  EKG is was not ordered today.  The ekg ordered today demonstrates   Recent Labs: 09/13/2020: ALT 15 10/24/2020: Magnesium 2.2 11/06/2020: BUN 12; Creatinine, Ser 1.10; Hemoglobin 14.6; Platelets 193; Potassium 4.3; Sodium 139  Recent Lipid Panel    Component Value Date/Time   CHOL 167 09/13/2020 1008   TRIG 138.0 09/13/2020 1008   HDL 41.00 09/13/2020 1008   CHOLHDL 4  09/13/2020 1008   VLDL 27.6 09/13/2020 1008   LDLCALC 98 09/13/2020 1008   LDLDIRECT 110.0 09/13/2020 1008     Risk Assessment/Calculations:           Physical Exam:    VS:  BP 114/64 (BP Location: Right Arm, Patient Position: Sitting, Cuff Size: Normal)   Pulse 70   Ht '5\' 11"'$  (1.803 m)   Wt 189 lb (85.7 kg)   SpO2 97%   BMI 26.36 kg/m     Wt Readings from Last 3 Encounters:  12/12/20 189 lb (85.7 kg)  11/13/20 188 lb (85.3 kg)  11/02/20 188 lb (85.3 kg)     GEN:  Well nourished, well developed in no acute distress HEENT: Normal NECK: No JVD; No carotid bruits LYMPHATICS: No lymphadenopathy CARDIAC: RRR, no murmurs, rubs, gallops RESPIRATORY:  Clear to auscultation without rales, wheezing or rhonchi  ABDOMEN: Soft, non-tender, non-distended MUSCULOSKELETAL:  No edema; No deformity  SKIN: Warm and dry NEUROLOGIC:  Alert and oriented x 3 PSYCHIATRIC:  Normal affect   ASSESSMENT:    1. Coronary artery disease involving native coronary artery of native heart, unspecified whether angina present   2. Hyperlipidemia, unspecified hyperlipidemia type   3. Essential hypertension    PLAN:    In order of problems listed above:  Coronary artery disease-no anginal symptoms continue patient on dual antiplatelet therapy with aspirin and Plavix.  I have explained to the patient that he will need to be on this for at least the next 12 months at which time we will reevaluate and consider stopping the Plavix.  He will continue on his lipid-lowering agent which includes Repatha, he is still on statin and Zetia.  His LDL goal is less than 55 at this time.  He also follows with our lipid clinic. He is gradually getting back into exercise and he has been hiking a few times.  Blood pressure is acceptable, continue with current antihypertensive regimen.  Follow-up in 6 months.   Medication Adjustments/Labs and Tests Ordered: Current medicines are reviewed at length with the patient  today.  Concerns regarding medicines are outlined above.  No orders of the defined types were placed in this encounter.  No orders of the defined types were placed in this encounter.   Patient Instructions  Medication Instructions:  Your physician recommends that you continue on your current medications as directed. Please refer to the Current Medication list given to you today.  *If  you need a refill on your cardiac medications before your next appointment, please call your pharmacy*   Lab Work: None If you have labs (blood work) drawn today and your tests are completely normal, you will receive your results only by: [place] (if you have MyChart) OR A paper copy in the mail If you have any lab test that is abnormal or we need to change your treatment, we will call you to review the results.   Testing/Procedures: None   Follow-Up: At [facility], you and your health needs are our priority.  As part of our continuing mission to provide you with exceptional heart care, we have created designated Provider Care Teams.  These Care Teams include your primary Cardiologist (physician) and Advanced Practice Providers (APPs -  Physician Assistants and Nurse Practitioners) who all work together to provide you with the care you need, when you need it.  We recommend signing up for the patient portal called "MyChart".  Sign up information is provided on this After Visit Summary.  MyChart is used to connect with patients for Virtual Visits (Telemedicine).  Patients are able to view lab/test results, encounter notes, upcoming appointments, etc.  Non-urgent messages can be sent to your provider as well.   To learn more about what you can do with MyChart, go to [email or web address].    Your next appointment:   6 month(s)  The format for your next appointment:   In Person  Provider:   Berniece Salines, DO [address], [place], [place] [address]    Other Instructions      Signed, Berniece Salines, DO  12/12/2020 12:51 PM    [place]

## 2020-12-12 NOTE — Patient Instructions (Signed)
Medication Instructions:  Your physician recommends that you continue on your current medications as directed. Please refer to the Current Medication list given to you today.  *If you need a refill on your cardiac medications before your next appointment, please call your pharmacy*   Lab Work: None If you have labs (blood work) drawn today and your tests are completely normal, you will receive your results only by: [place] (if you have MyChart) OR A paper copy in the mail If you have any lab test that is abnormal or we need to change your treatment, we will call you to review the results.   Testing/Procedures: None   Follow-Up: At [facility], you and your health needs are our priority.  As part of our continuing mission to provide you with exceptional heart care, we have created designated Provider Care Teams.  These Care Teams include your primary Cardiologist (physician) and Advanced Practice Providers (APPs -  Physician Assistants and Nurse Practitioners) who all work together to provide you with the care you need, when you need it.  We recommend signing up for the patient portal called "MyChart".  Sign up information is provided on this After Visit Summary.  MyChart is used to connect with patients for Virtual Visits (Telemedicine).  Patients are able to view lab/test results, encounter notes, upcoming appointments, etc.  Non-urgent messages can be sent to your provider as well.   To learn more about what you can do with MyChart, go to [email or web address].    Your next appointment:   6 month(s)  The format for your next appointment:   In Person  Provider:   Berniece Salines, DO [address], [place], [place] [address]    Other Instructions

## 2021-02-09 ENCOUNTER — Other Ambulatory Visit: Payer: Self-pay | Admitting: Family Medicine

## 2021-02-18 ENCOUNTER — Telehealth: Payer: Self-pay

## 2021-02-18 NOTE — Telephone Encounter (Signed)
Called and lmom to get labs at [facility] as instructed

## 2021-02-18 NOTE — Telephone Encounter (Signed)
-----   Message from Ramond Dial, [place] sent at 02/18/2021 11:08 AM EST ----- Will you just call and remind him to get his labs done at [facility]. Orders already in ----- Message ----- From: Ramond Dial, RPH-CPP Sent: 02/18/2021  12:00 AM EST To: Ramond Dial, RPH-CPP  Needs to get labs done at [facility] ----- Message ----- From: Ramond Dial, RPH-CPP Sent: 11/26/2020  12:00 AM EDT To: Ramond Dial, RPH-CPP  Follow up on PA

## 2021-02-23 ENCOUNTER — Other Ambulatory Visit: Payer: Self-pay | Admitting: Family Medicine

## 2021-02-23 DIAGNOSIS — I10 Essential (primary) hypertension: Secondary | ICD-10-CM

## 2021-02-26 ENCOUNTER — Telehealth: Payer: Self-pay

## 2021-02-26 ENCOUNTER — Other Ambulatory Visit: Payer: Self-pay | Admitting: Family Medicine

## 2021-02-26 DIAGNOSIS — I10 Essential (primary) hypertension: Secondary | ICD-10-CM

## 2021-02-26 NOTE — Telephone Encounter (Signed)
Lmom for fasting labs

## 2021-02-26 NOTE — Telephone Encounter (Signed)
-----   Message from Ramond Dial, [place] sent at 02/26/2021  7:21 AM EST ----- Regarding: FW: labs Please try to call again thanks ----- Message ----- From: Allean Found, CMA Sent: 02/18/2021  11:39 AM EST To: Ramond Dial, RPH-CPP Subject: labs                                           lvm ----- Message ----- From: Ramond Dial, RPH-CPP Sent: 02/18/2021  11:08 AM EST To: Allean Found, CMA  Will you just call and remind him to get his labs done at [facility]. Orders already in ----- Message ----- From: Ramond Dial, RPH-CPP Sent: 02/18/2021  12:00 AM EST To: Ramond Dial, RPH-CPP  Needs to get labs done at [facility] ----- Message ----- From: Ramond Dial, RPH-CPP Sent: 11/26/2020  12:00 AM EDT To: Ramond Dial, RPH-CPP  Follow up on PA

## 2021-03-05 ENCOUNTER — Other Ambulatory Visit: Payer: Self-pay

## 2021-03-05 ENCOUNTER — Ambulatory Visit (INDEPENDENT_AMBULATORY_CARE_PROVIDER_SITE_OTHER): Payer: PPO

## 2021-03-05 DIAGNOSIS — E785 Hyperlipidemia, unspecified: Secondary | ICD-10-CM | POA: Diagnosis not present

## 2021-03-05 DIAGNOSIS — Z0000 Encounter for general adult medical examination without abnormal findings: Secondary | ICD-10-CM

## 2021-03-05 DIAGNOSIS — Z1211 Encounter for screening for malignant neoplasm of colon: Secondary | ICD-10-CM | POA: Diagnosis not present

## 2021-03-05 NOTE — Patient Instructions (Signed)
Patrick [name] , Thank you for taking time to come for your Medicare Wellness Visit. I appreciate your ongoing commitment to your health goals. Please review the following plan we discussed and let me know if I can assist you in the future.   Screening recommendations/referrals: Colonoscopy: referral 03/05/2021 Recommended yearly ophthalmology/optometry visit for glaucoma screening and checkup Recommended yearly dental visit for hygiene and checkup  Vaccinations: Influenza vaccine: completed  Pneumococcal vaccine: completed  Tdap vaccine: 10/05/2019 Shingles vaccine: will consider    Advanced directives: none  Conditions/risks identified: none  Next appointment: none   Preventive Care 27 Years and Older, Male Preventive care refers to lifestyle choices and visits with your health care provider that can promote health and wellness. What does preventive care include? A yearly physical exam. This is also called an annual well check. Dental exams once or twice a year. Routine eye exams. Ask your health care provider how often you should have your eyes checked. Personal lifestyle choices, including: Daily care of your teeth and gums. Regular physical activity. Eating a healthy diet. Avoiding tobacco and drug use. Limiting alcohol use. Practicing safe sex. Taking low doses of aspirin every day. Taking vitamin and mineral supplements as recommended by your health care provider. What happens during an annual well check? The services and screenings done by your health care provider during your annual well check will depend on your age, overall health, lifestyle risk factors, and family history of disease. Counseling  Your health care provider may ask you questions about your: Alcohol use. Tobacco use. Drug use. Emotional well-being. Home and relationship well-being. Sexual activity. Eating habits. History of falls. Memory and ability to understand (cognition). Work and work  Statistician. Screening  You may have the following tests or measurements: Height, weight, and BMI. Blood pressure. Lipid and cholesterol levels. These may be checked every 5 years, or more frequently if you are over 76 years old. Skin check. Lung cancer screening. You may have this screening every year starting at age 66 if you have a 30-pack-year history of smoking and currently smoke or have quit within the past 15 years. Fecal occult blood test (FOBT) of the stool. You may have this test every year starting at age 66. Flexible sigmoidoscopy or colonoscopy. You may have a sigmoidoscopy every 5 years or a colonoscopy every 10 years starting at age 66. Prostate cancer screening. Recommendations will vary depending on your family history and other risks. Hepatitis C blood test. Hepatitis B blood test. Sexually transmitted disease (STD) testing. Diabetes screening. This is done by checking your blood sugar (glucose) after you have not eaten for a while (fasting). You may have this done every 1-3 years. Abdominal aortic aneurysm (AAA) screening. You may need this if you are a current or former smoker. Osteoporosis. You may be screened starting at age 66 if you are at high risk. Talk with your health care provider about your test results, treatment options, and if necessary, the need for more tests. Vaccines  Your health care provider may recommend certain vaccines, such as: Influenza vaccine. This is recommended every year. Tetanus, diphtheria, and acellular pertussis (Tdap, Td) vaccine. You may need a Td booster every 10 years. Zoster vaccine. You may need this after age 66. Pneumococcal 13-valent conjugate (PCV13) vaccine. One dose is recommended after age 66. Pneumococcal polysaccharide (PPSV23) vaccine. One dose is recommended after age 66. Talk to your health care provider about which screenings and vaccines you need and how often you need them.  This information is not intended to replace  advice given to you by your health care provider. Make sure you discuss any questions you have with your health care provider. Document Released: 04/20/2015 Document Revised: 12/12/2015 Document Reviewed: 01/23/2015 Elsevier Interactive Patient Education  2017 [employer] Prevention in the Home Falls can cause injuries. They can happen to people of all ages. There are many things you can do to make your home safe and to help prevent falls. What can I do on the outside of my home? Regularly fix the edges of walkways and driveways and fix any cracks. Remove anything that might make you trip as you walk through a door, such as a raised step or threshold. Trim any bushes or trees on the path to your home. Use bright outdoor lighting. Clear any walking paths of anything that might make someone trip, such as rocks or tools. Regularly check to see if handrails are loose or broken. Make sure that both sides of any steps have handrails. Any raised decks and porches should have guardrails on the edges. Have any leaves, snow, or ice cleared regularly. Use sand or salt on walking paths during winter. Clean up any spills in your garage right away. This includes oil or grease spills. What can I do in the bathroom? Use night lights. Install grab bars by the toilet and in the tub and shower. Do not use towel bars as grab bars. Use non-skid mats or decals in the tub or shower. If you need to sit down in the shower, use a plastic, non-slip stool. Keep the floor dry. Clean up any water that spills on the floor as soon as it happens. Remove soap buildup in the tub or shower regularly. Attach bath mats securely with double-sided non-slip rug tape. Do not have throw rugs and other things on the floor that can make you trip. What can I do in the bedroom? Use night lights. Make sure that you have a light by your bed that is easy to reach. Do not use any sheets or blankets that are too big for your bed.  They should not hang down onto the floor. Have a firm chair that has side arms. You can use this for support while you get dressed. Do not have throw rugs and other things on the floor that can make you trip. What can I do in the kitchen? Clean up any spills right away. Avoid walking on wet floors. Keep items that you use a lot in easy-to-reach places. If you need to reach something above you, use a strong step stool that has a grab bar. Keep electrical cords out of the way. Do not use floor polish or wax that makes floors slippery. If you must use wax, use non-skid floor wax. Do not have throw rugs and other things on the floor that can make you trip. What can I do with my stairs? Do not leave any items on the stairs. Make sure that there are handrails on both sides of the stairs and use them. Fix handrails that are broken or loose. Make sure that handrails are as long as the stairways. Check any carpeting to make sure that it is firmly attached to the stairs. Fix any carpet that is loose or worn. Avoid having throw rugs at the top or bottom of the stairs. If you do have throw rugs, attach them to the floor with carpet tape. Make sure that you have a light switch at the  top of the stairs and the bottom of the stairs. If you do not have them, ask someone to add them for you. What else can I do to help prevent falls? Wear shoes that: Do not have high heels. Have rubber bottoms. Are comfortable and fit you well. Are closed at the toe. Do not wear sandals. If you use a stepladder: Make sure that it is fully opened. Do not climb a closed stepladder. Make sure that both sides of the stepladder are locked into place. Ask someone to hold it for you, if possible. Clearly mark and make sure that you can see: Any grab bars or handrails. First and last steps. Where the edge of each step is. Use tools that help you move around (mobility aids) if they are needed. These  include: Canes. Walkers. Scooters. Crutches. Turn on the lights when you go into a dark area. Replace any light bulbs as soon as they burn out. Set up your furniture so you have a clear path. Avoid moving your furniture around. If any of your floors are uneven, fix them. If there are any pets around you, be aware of where they are. Review your medicines with your doctor. Some medicines can make you feel dizzy. This can increase your chance of falling. Ask your doctor what other things that you can do to help prevent falls. This information is not intended to replace advice given to you by your health care provider. Make sure you discuss any questions you have with your health care provider. Document Released: 01/18/2009 Document Revised: 08/30/2015 Document Reviewed: 04/28/2014 Elsevier Interactive Patient Education  2017 [employer].

## 2021-03-05 NOTE — Progress Notes (Signed)
Subjective:   Kanai Berrios is a 65 y.o. male who presents for an Initial Medicare Annual Wellness Visit.  I connected with Draco Malczewski  today by telephone and verified that I am speaking with the correct person using two identifiers. Location patient: home Location provider: work Persons participating in the virtual visit: patient, provider.   I discussed the limitations, risks, security and privacy concerns of performing an evaluation and management service by telephone and the availability of in person appointments. I also discussed with the patient that there may be a patient responsible charge related to this service. The patient expressed understanding and verbally consented to this telephonic visit.    Interactive audio and video telecommunications were attempted between this provider and patient, however failed, due to patient having technical difficulties OR patient did not have access to video capability.  We continued and completed visit with audio only.    Review of Systems     Cardiac Risk Factors include: advanced age (>42men, >21 women);dyslipidemia;male gender     Objective:    Today's Vitals   There is no height or weight on file to calculate BMI.  Advanced Directives 03/05/2021 11/13/2020 11/02/2020 02/14/2016 02/14/2016 01/22/2016 01/07/2016  Does Patient Have a Medical Advance Directive? No No No No No No No  Would patient like information on creating a medical advance directive? No - Patient declined No - Patient declined Yes (MAU/Ambulatory/Procedural Areas - Information given) - - No - patient declined information No - patient declined information    Current Medications (verified) Outpatient Encounter Medications as of 03/05/2021  Medication Sig   amLODipine (NORVASC) 5 MG tablet TAKE ONE TABLET BY MOUTH ONE TIME DAILY   aspirin EC 81 MG tablet Take 81 mg by mouth daily. Swallow whole.   clopidogrel (PLAVIX) 75 MG tablet Take 1 tablet (75 mg total) by mouth  daily.   Evolocumab (REPATHA SURECLICK) 706 MG/ML SOAJ Inject 1 pen into the skin every 14 (fourteen) days.   ezetimibe (ZETIA) 10 MG tablet Take 1 tablet (10 mg total) by mouth daily.   finasteride (PROSCAR) 5 MG tablet Take 5 mg by mouth daily.   rosuvastatin (CRESTOR) 40 MG tablet TAKE ONE TABLET BY MOUTH DAILY IN THE EVENING   tamsulosin (FLOMAX) 0.4 MG CAPS capsule Take 1 capsule (0.4 mg total) by mouth daily. (Patient taking differently: Take 0.4 mg by mouth 2 (two) times daily.)   nitroGLYCERIN (NITROSTAT) 0.4 MG SL tablet Place 1 tablet (0.4 mg total) under the tongue every 5 (five) minutes as needed for chest pain.   No facility-administered encounter medications on file as of 03/05/2021.    Allergies (verified) Patient has no known allergies.   History: Past Medical History:  Diagnosis Date   Coronary artery disease    moderate   Hyperlipidemia    TIA (transient ischemic attack)    Past Surgical History:  Procedure Laterality Date   CORONARY ATHERECTOMY N/A 11/13/2020   Procedure: CORONARY ATHERECTOMY;  Surgeon: Sherren Mocha, MD;  Location: [facility];  Service: Cardiovascular;  Laterality: N/A;   INTRAVASCULAR PRESSURE WIRE/FFR STUDY N/A 11/02/2020   Procedure: INTRAVASCULAR PRESSURE WIRE/FFR STUDY;  Surgeon: Sherren Mocha, MD;  Location: [facility];  Service: Cardiovascular;  Laterality: N/A;   LEFT HEART CATH AND CORONARY ANGIOGRAPHY N/A 11/02/2020   Procedure: LEFT HEART CATH AND CORONARY ANGIOGRAPHY;  Surgeon: Sherren Mocha, MD;  Location: [facility];  Service: Cardiovascular;  Laterality: N/A;   MANDIBULAR HARDWARE REMOVAL  N/A 02/21/2016   Procedure: MANDIBULAR HARDWARE REMOVAL;  Surgeon: Wallace Going, DO;  Location: [facility];  Service: Plastics;  Laterality: N/A;   ORIF MANDIBULAR FRACTURE N/A 01/07/2016   Procedure: OPEN REDUCTION INTERNAL FIXATION (ORIF) MANDIBULAR FRACTURE;  Surgeon: Loel Lofty Dillingham, DO;   Location: [facility];  Service: Plastics;  Laterality: N/A;   PLACEMENT OF LUMBAR DRAIN N/A 01/14/2016   Procedure: PLACEMENT OF LUMBAR DRAIN;  Surgeon: Newman Pies, MD;  Location: [facility];  Service: Neurosurgery;  Laterality: N/A;   Family History  Problem Relation Age of Onset   Cancer Mother    Heart disease Father    Social History   Socioeconomic History   Marital status: Married    Spouse name: Not on file   Number of children: Not on file   Years of education: Not on file   Highest education level: Not on file  Occupational History   Not on file  Tobacco Use   Smoking status: Former    Types: Cigarettes    Quit date: 01/06/1995    Years since quitting: 26.1   Smokeless tobacco: Never  Substance and Sexual Activity   Alcohol use: Yes   Drug use: No   Sexual activity: Not on file  Other Topics Concern   Not on file  Social History Narrative   Not on file   Social Determinants of Health   Financial Resource Strain: Low Risk    Difficulty of Paying Living Expenses: Not hard at all  Food Insecurity: No Food Insecurity   Worried About Charity fundraiser in the Last Year: Never true   [place] in the Last Year: Never true  Transportation Needs: No Transportation Needs   Lack of Transportation (Medical): No   Lack of Transportation (Non-Medical): No  Physical Activity: Sufficiently Active   Days of Exercise per Week: 5 days   Minutes of Exercise per Session: 40 min  Stress: No Stress Concern Present   Feeling of Stress : Not at all  Social Connections: Moderately Integrated   Frequency of Communication with Friends and Family: Twice a week   Frequency of Social Gatherings with Friends and Family: Twice a week   Attends Religious Services: 1 to 4 times per year   Active Member of [employer] or Organizations: No   Attends Music therapist: Never   Marital Status: Married    Tobacco Counseling Counseling given: Not Answered   Clinical  Intake:  Pre-visit preparation completed: Yes  Pain : No/denies pain     Nutritional Risks: None Diabetes: No  How often do you need to have someone help you when you read instructions, pamphlets, or other written materials from your doctor or pharmacy?: 1 - Never What is the last grade level you completed in school?: college  Diabetic?no   Interpreter Needed?: No  Information entered by :: [place] of Daily Living In your present state of health, do you have any difficulty performing the following activities: 03/05/2021  Hearing? N  Vision? N  Difficulty concentrating or making decisions? N  Walking or climbing stairs? N  Dressing or bathing? N  Doing errands, shopping? N  Preparing Food and eating ? N  Using the Toilet? N  In the past six months, have you accidently leaked urine? N  Do you have problems with loss of bowel control? N  Managing your Medications? N  Managing your Finances? N  Housekeeping or managing your Housekeeping?  N  Some recent data might be hidden    Patient Care Team: Libby Maw, MD as PCP - General (Family Medicine) Berniece Salines, DO as PCP - Cardiology (Cardiology)  Indicate any recent Medical Services you may have received from other than [facility] providers in the past year (date may be approximate).     Assessment:   This is a routine wellness examination for [place].  Hearing/Vision screen Vision Screening - Comments:: Annual eye exam   Dietary issues and exercise activities discussed: Current Exercise Habits: Home exercise routine, Type of exercise: walking, Time (Minutes): 45, Frequency (Times/Week): 5, Weekly Exercise (Minutes/Week): 225, Intensity: Mild   Goals Addressed   None    Depression Screen PHQ 2/9 Scores 03/05/2021 03/05/2021 09/12/2020 10/21/2019 10/21/2019  PHQ - 2 Score 0 0 0 0 0  PHQ- 9 Score - - - 1 -    Fall Risk Fall Risk  03/05/2021 09/12/2020 10/21/2019 03/19/2016 02/14/2016  Falls in  the past year? 0 0 0 No Yes  Number falls in past yr: 0 - - - 1  Injury with Fall? 0 - - - Yes  Follow up Falls evaluation completed - - - -    FALL RISK PREVENTION PERTAINING TO THE HOME:  Any stairs in or around the home? Yes  If so, are there any without handrails? No  Home free of loose throw rugs in walkways, pet beds, electrical cords, etc? Yes  Adequate lighting in your home to reduce risk of falls? Yes   ASSISTIVE DEVICES UTILIZED TO PREVENT FALLS:  Life alert? No  Use of a cane, walker or w/c? No  Grab bars in the bathroom? Yes  Shower chair or bench in shower? Yes  Elevated toilet seat or a handicapped toilet? No   Cognitive Function:  Normal cognitive status assessed by direct observation by this Nurse Health Advisor. No abnormalities found.        Immunizations Immunization History  Administered Date(s) Administered   Influenza,inj,Quad PF,6+ Mos 01/08/2016   Influenza-Unspecified 01/08/2016   PFIZER(Purple Top)SARS-COV-2 Vaccination 06/16/2019, 07/07/2019, 01/15/2020   Pneumococcal Polysaccharide-23 08/22/2016, 08/22/2016   Tdap 10/05/2019    TDAP status: Up to date  Flu Vaccine status: Up to date  Pneumococcal vaccine status: Up to date  Covid-19 vaccine status: Completed vaccines  Qualifies for Shingles Vaccine? Yes   Zostavax completed No   Shingrix Completed?: No.    Education has been provided regarding the importance of this vaccine. Patient has been advised to call insurance company to determine out of pocket expense if they have not yet received this vaccine. Advised may also receive vaccine at local pharmacy or Health Dept. Verbalized acceptance and understanding.  Screening Tests Health Maintenance  Topic Date Due   Hepatitis C Screening  Never done   COLONOSCOPY (Pts 45-19yrs Insurance coverage will need to be confirmed)  Never done   Zoster Vaccines- Shingrix (1 of 2) Never done   Pneumonia Vaccine 61+ Years old (2 - PCV) 08/22/2017    COVID-19 Vaccine (4 - Booster for Pfizer series) 03/11/2020   INFLUENZA VACCINE  11/05/2020   TETANUS/TDAP  10/04/2029   HPV VACCINES  Aged Out    Health Maintenance  Health Maintenance Due  Topic Date Due   Hepatitis C Screening  Never done   COLONOSCOPY (Pts 45-63yrs Insurance coverage will need to be confirmed)  Never done   Zoster Vaccines- Shingrix (1 of 2) Never done   Pneumonia Vaccine 25+ Years old (2 - PCV) 08/22/2017  COVID-19 Vaccine (4 - Booster for Pfizer series) 03/11/2020   INFLUENZA VACCINE  11/05/2020    Colorectal cancer screening: Referral to GI placed 03/05/2021. Pt aware the office will call re: appt.  Lung Cancer Screening: (Low Dose CT Chest recommended if Age 29-80 years, 30 pack-year currently smoking OR have quit w/in 15years.) does not qualify.   Lung Cancer Screening Referral: n/a  Additional Screening:  Hepatitis C Screening: does qualify;  Vision Screening: Recommended annual ophthalmology exams for early detection of glaucoma and other disorders of the eye. Is the patient up to date with their annual eye exam?  Yes  Who is the provider or what is the name of the office in which the patient attends annual eye exams? [facility]  If pt is not established with a provider, would they like to be referred to a provider to establish care? No .   Dental Screening: Recommended annual dental exams for proper oral hygiene  Community Resource Referral / Chronic Care Management: CRR required this visit?  No   CCM required this visit?  No      Plan:     I have personally reviewed and noted the following in the patient's chart:   Medical and social history Use of alcohol, tobacco or illicit drugs  Current medications and supplements including opioid prescriptions. Patient is not currently taking opioid prescriptions. Functional ability and status Nutritional status Physical activity Advanced directives List of other physicians Hospitalizations,  surgeries, and ER visits in previous 12 months Vitals Screenings to include cognitive, depression, and falls Referrals and appointments  In addition, I have reviewed and discussed with patient certain preventive protocols, quality metrics, and best practice recommendations. A written personalized care plan for preventive services as well as general preventive health recommendations were provided to patient.     Randel Pigg, LPN   29/92/[id number]   Nurse Notes: none

## 2021-03-06 LAB — LIPID PANEL
Chol/HDL Ratio: 1.9 ratio (ref 0.0–5.0)
Cholesterol, Total: 75 mg/dL — ABNORMAL LOW (ref 100–199)
HDL: 40 mg/dL (ref >39)
LDL Chol Calc (NIH): 18 mg/dL (ref 0–99)
Triglycerides: 81 mg/dL (ref 0–149)
VLDL Cholesterol Cal: 17 mg/dL (ref 5–40)

## 2021-03-18 ENCOUNTER — Other Ambulatory Visit: Payer: Self-pay

## 2021-03-19 ENCOUNTER — Ambulatory Visit (INDEPENDENT_AMBULATORY_CARE_PROVIDER_SITE_OTHER): Payer: PPO | Admitting: Family Medicine

## 2021-03-19 ENCOUNTER — Encounter: Payer: Self-pay | Admitting: Family Medicine

## 2021-03-19 VITALS — BP 108/64 | HR 63 | Temp 96.8°F | Ht 71.0 in | Wt 186.2 lb

## 2021-03-19 DIAGNOSIS — Z1211 Encounter for screening for malignant neoplasm of colon: Secondary | ICD-10-CM

## 2021-03-19 DIAGNOSIS — I2510 Atherosclerotic heart disease of native coronary artery without angina pectoris: Secondary | ICD-10-CM | POA: Diagnosis not present

## 2021-03-19 DIAGNOSIS — I10 Essential (primary) hypertension: Secondary | ICD-10-CM | POA: Diagnosis not present

## 2021-03-19 DIAGNOSIS — Z789 Other specified health status: Secondary | ICD-10-CM

## 2021-03-19 DIAGNOSIS — I2583 Coronary atherosclerosis due to lipid rich plaque: Secondary | ICD-10-CM

## 2021-03-19 DIAGNOSIS — N400 Enlarged prostate without lower urinary tract symptoms: Secondary | ICD-10-CM | POA: Diagnosis not present

## 2021-03-19 DIAGNOSIS — Z23 Encounter for immunization: Secondary | ICD-10-CM

## 2021-03-19 DIAGNOSIS — Z0000 Encounter for general adult medical examination without abnormal findings: Secondary | ICD-10-CM | POA: Insufficient documentation

## 2021-03-19 LAB — COMPREHENSIVE METABOLIC PANEL
ALT: 13 U/L (ref 0–53)
AST: 15 U/L (ref 0–37)
Albumin: 4.4 g/dL (ref 3.5–5.2)
Alkaline Phosphatase: 50 U/L (ref 39–117)
BUN: 16 mg/dL (ref 6–23)
CO2: 29 mEq/L (ref 19–32)
Calcium: 9.6 mg/dL (ref 8.4–10.5)
Chloride: 103 mEq/L (ref 96–112)
Creatinine, Ser: 1.09 mg/dL (ref 0.40–1.50)
GFR: 70.87 mL/min (ref >60.00)
Glucose, Bld: 124 mg/dL — ABNORMAL HIGH (ref 70–99)
Potassium: 4.2 mEq/L (ref 3.5–5.1)
Sodium: 139 mEq/L (ref 135–145)
Total Bilirubin: 0.6 mg/dL (ref 0.2–1.2)
Total Protein: 6.7 g/dL (ref 6.0–8.3)

## 2021-03-19 LAB — URINALYSIS, ROUTINE W REFLEX MICROSCOPIC
Bilirubin Urine: NEGATIVE
Hgb urine dipstick: NEGATIVE
Ketones, ur: NEGATIVE
Leukocytes,Ua: NEGATIVE
Nitrite: NEGATIVE
Specific Gravity, Urine: 1.015 (ref 1.000–1.030)
Total Protein, Urine: NEGATIVE
Urine Glucose: NEGATIVE
Urobilinogen, UA: 0.2 (ref 0.0–1.0)
pH: 6 (ref 5.0–8.0)

## 2021-03-19 LAB — CBC
HCT: 41.8 % (ref 39.0–52.0)
Hemoglobin: 14.1 g/dL (ref 13.0–17.0)
MCHC: 33.7 g/dL (ref 30.0–36.0)
MCV: 88.4 fl (ref 78.0–100.0)
Platelets: 152 10*3/uL (ref 150.0–400.0)
RBC: 4.73 Mil/uL (ref 4.22–5.81)
RDW: 12.6 % (ref 11.5–15.5)
WBC: 4.2 10*3/uL (ref 4.0–10.5)

## 2021-03-19 LAB — PSA: PSA: 0.15 ng/mL (ref 0.10–4.00)

## 2021-03-19 MED ORDER — EZETIMIBE 10 MG PO TABS: 10.0000 mg | ORAL_TABLET | Freq: Every day | ORAL | 3 refills | Status: DC

## 2021-03-19 MED ORDER — TAMSULOSIN HCL 0.4 MG PO CAPS: 0.4000 mg | ORAL_CAPSULE | Freq: Every day | ORAL | 0 refills | Status: DC

## 2021-03-19 MED ORDER — FINASTERIDE 5 MG PO TABS: 5.0000 mg | ORAL_TABLET | Freq: Every day | ORAL | 0 refills | Status: DC

## 2021-03-19 NOTE — Progress Notes (Signed)
Established Patient Office Visit  Subjective:  Patient ID: Patrick [name], male    DOB: Feb 24, 1956  Age: 65 y.o. MRN: [id number]  CC:  Chief Complaint  Patient presents with   Follow-up    6 month follow up. No concerns, patient not fasting.     HPI Cordon Gassett Horace presents for follow-up of hypertension, BPH, health maintenance and alcohol use.  Continues to moderate alcohol usage to no more than a sixpack on the weekends.  His wife is directly involved.  Continues follow-up with cardiology coronary artery disease.  Lipid profile has responded quite well to Repatha therapy.  He will continue Zetia for now.  BPH symptoms are controlled with finasteride and tamsulosin.  He does not do well with them.  Follow-up with urology is pending.  85 year old sister recently diagnosed with colon cancer.  He believes his last colonoscopy was 7 years ago but is not sure.  Past Medical History:  Diagnosis Date   Coronary artery disease    moderate   Hyperlipidemia    TIA (transient ischemic attack)     Past Surgical History:  Procedure Laterality Date   CORONARY ATHERECTOMY N/A 11/13/2020   Procedure: CORONARY ATHERECTOMY;  Surgeon: Sherren Mocha, MD;  Location: [facility];  Service: Cardiovascular;  Laterality: N/A;   INTRAVASCULAR PRESSURE WIRE/FFR STUDY N/A 11/02/2020   Procedure: INTRAVASCULAR PRESSURE WIRE/FFR STUDY;  Surgeon: Sherren Mocha, MD;  Location: [facility];  Service: Cardiovascular;  Laterality: N/A;   LEFT HEART CATH AND CORONARY ANGIOGRAPHY N/A 11/02/2020   Procedure: LEFT HEART CATH AND CORONARY ANGIOGRAPHY;  Surgeon: Sherren Mocha, MD;  Location: [facility];  Service: Cardiovascular;  Laterality: N/A;   MANDIBULAR HARDWARE REMOVAL N/A 02/21/2016   Procedure: MANDIBULAR HARDWARE REMOVAL;  Surgeon: Wallace Going, DO;  Location: [facility];  Service: Plastics;  Laterality: N/A;   ORIF MANDIBULAR FRACTURE N/A 01/07/2016    Procedure: OPEN REDUCTION INTERNAL FIXATION (ORIF) MANDIBULAR FRACTURE;  Surgeon: Loel Lofty Dillingham, DO;  Location: [facility];  Service: Plastics;  Laterality: N/A;   PLACEMENT OF LUMBAR DRAIN N/A 01/14/2016   Procedure: PLACEMENT OF LUMBAR DRAIN;  Surgeon: Newman Pies, MD;  Location: [facility];  Service: Neurosurgery;  Laterality: N/A;    Family History  Problem Relation Age of Onset   Cancer Mother    Heart disease Father     Social History   Socioeconomic History   Marital status: Married    Spouse name: Not on file   Number of children: Not on file   Years of education: Not on file   Highest education level: Not on file  Occupational History   Not on file  Tobacco Use   Smoking status: Former    Types: Cigarettes    Quit date: 01/06/1995    Years since quitting: 26.2   Smokeless tobacco: Never  Substance and Sexual Activity   Alcohol use: Yes   Drug use: No   Sexual activity: Not on file  Other Topics Concern   Not on file  Social History Narrative   Not on file   Social Determinants of Health   Financial Resource Strain: Low Risk    Difficulty of Paying Living Expenses: Not hard at all  Food Insecurity: No Food Insecurity   Worried About Charity fundraiser in the Last Year: Never true   [place] in the Last Year: Never true  Transportation Needs: No Transportation Needs   Lack  of Transportation (Medical): No   Lack of Transportation (Non-Medical): No  Physical Activity: Sufficiently Active   Days of Exercise per Week: 5 days   Minutes of Exercise per Session: 40 min  Stress: No Stress Concern Present   Feeling of Stress : Not at all  Social Connections: Moderately Integrated   Frequency of Communication with Friends and Family: Twice a week   Frequency of Social Gatherings with Friends and Family: Twice a week   Attends Religious Services: 1 to 4 times per year   Active Member of [employer] or Organizations: No   Attends Music therapist: Never    Marital Status: Married  Human resources officer Violence: Not At Risk   Fear of Current or Ex-Partner: No   Emotionally Abused: No   Physically Abused: No   Sexually Abused: No    Outpatient Medications Prior to Visit  Medication Sig Dispense Refill   amLODipine (NORVASC) 5 MG tablet TAKE ONE TABLET BY MOUTH ONE TIME DAILY 90 tablet 0   aspirin EC 81 MG tablet Take 81 mg by mouth daily. Swallow whole.     clopidogrel (PLAVIX) 75 MG tablet Take 1 tablet (75 mg total) by mouth daily. 90 tablet 2   Evolocumab (REPATHA SURECLICK) 956 MG/ML SOAJ Inject 1 pen into the skin every 14 (fourteen) days. 6 mL 3   rosuvastatin (CRESTOR) 40 MG tablet TAKE ONE TABLET BY MOUTH DAILY IN THE EVENING 90 tablet 0   ezetimibe (ZETIA) 10 MG tablet Take 1 tablet (10 mg total) by mouth daily. 90 tablet 3   finasteride (PROSCAR) 5 MG tablet Take 5 mg by mouth daily.     tamsulosin (FLOMAX) 0.4 MG CAPS capsule Take 1 capsule (0.4 mg total) by mouth daily. (Patient taking differently: Take 0.4 mg by mouth 2 (two) times daily.) 30 capsule 0   nitroGLYCERIN (NITROSTAT) 0.4 MG SL tablet Place 1 tablet (0.4 mg total) under the tongue every 5 (five) minutes as needed for chest pain. 90 tablet 3   No facility-administered medications prior to visit.    No Known Allergies  ROS Review of Systems  Constitutional:  Negative for diaphoresis, fatigue, fever and unexpected weight change.  HENT: Negative.    Eyes:  Negative for photophobia and visual disturbance.  Respiratory: Negative.    Cardiovascular:  Negative for chest pain and palpitations.  Gastrointestinal:  Negative for anal bleeding and blood in stool.  Endocrine: Negative for polyphagia and polyuria.  Genitourinary: Negative.  Negative for difficulty urinating, frequency and urgency.  Musculoskeletal:  Negative for back pain and gait problem.  Skin:  Negative for pallor and rash.  Neurological:  Negative for speech difficulty and light-headedness.   Hematological:  Does not bruise/bleed easily.  Psychiatric/Behavioral: Negative.       Objective:    Physical Exam Vitals and nursing note reviewed.  Constitutional:      General: He is not in acute distress.    Appearance: Normal appearance. He is not ill-appearing, toxic-appearing or diaphoretic.  HENT:     Head: Normocephalic and atraumatic.     Right Ear: Tympanic membrane, ear canal and external ear normal.     Left Ear: Tympanic membrane, ear canal and external ear normal.     Mouth/Throat:     Mouth: Mucous membranes are moist.     Pharynx: Oropharynx is clear. No oropharyngeal exudate or posterior oropharyngeal erythema.  Eyes:     General: No scleral icterus.       Right eye:  No discharge.        Left eye: No discharge.     Extraocular Movements: Extraocular movements intact.     Conjunctiva/sclera: Conjunctivae normal.     Pupils: Pupils are equal, round, and reactive to light.  Neck:     Vascular: No carotid bruit.  Cardiovascular:     Rate and Rhythm: Normal rate and regular rhythm.  Pulmonary:     Effort: Pulmonary effort is normal.     Breath sounds: Normal breath sounds.  Abdominal:     General: Bowel sounds are normal.  Musculoskeletal:     Cervical back: No rigidity or tenderness.  Lymphadenopathy:     Cervical: No cervical adenopathy.  Neurological:     General: No focal deficit present.     Mental Status: He is alert and oriented to person, place, and time.  Psychiatric:        Mood and Affect: Mood normal.        Behavior: Behavior normal.    BP 108/64 (BP Location: Right Arm, Patient Position: Sitting, Cuff Size: Normal)    Pulse 63    Temp (!) 96.8 F (36 C) (Temporal)    Ht 5' 11"  (1.803 m)    Wt 186 lb 3.2 oz (84.5 kg)    SpO2 96%    BMI 25.97 kg/m  Wt Readings from Last 3 Encounters:  03/19/21 186 lb 3.2 oz (84.5 kg)  12/12/20 189 lb (85.7 kg)  11/13/20 188 lb (85.3 kg)     Health Maintenance Due  Topic Date Due   Hepatitis C  Screening  Never done   COLONOSCOPY (Pts 45-68yr Insurance coverage will need to be confirmed)  Never done   Zoster Vaccines- Shingrix (1 of 2) Never done    There are no preventive care reminders to display for this patient.  No results found for: TSH Lab Results  Component Value Date   WBC 5.2 11/06/2020   HGB 14.6 11/06/2020   HCT 43.8 11/06/2020   MCV 90 11/06/2020   PLT 193 11/06/2020   Lab Results  Component Value Date   NA 139 11/06/2020   K 4.3 11/06/2020   CO2 24 11/06/2020   GLUCOSE 82 11/06/2020   BUN 12 11/06/2020   CREATININE 1.10 11/06/2020   BILITOT 0.7 09/13/2020   ALKPHOS 46 09/13/2020   AST 14 09/13/2020   ALT 15 09/13/2020   PROT 6.9 09/13/2020   ALBUMIN 4.6 09/13/2020   CALCIUM 9.9 11/06/2020   ANIONGAP 11 01/23/2016   EGFR 74 11/06/2020   GFR 79.83 09/13/2020   Lab Results  Component Value Date   CHOL 75 (L) 03/05/2021   Lab Results  Component Value Date   HDL 40 03/05/2021   Lab Results  Component Value Date   LDLCALC 18 03/05/2021   Lab Results  Component Value Date   TRIG 81 03/05/2021   Lab Results  Component Value Date   CHOLHDL 1.9 03/05/2021   No results found for: HGBA1C    Assessment & Plan:   Problem List Items Addressed This Visit       Cardiovascular and Mediastinum   Coronary artery disease due to lipid rich plaque   Relevant Medications   ezetimibe (ZETIA) 10 MG tablet   Essential hypertension - Primary   Relevant Medications   ezetimibe (ZETIA) 10 MG tablet   Other Relevant Orders   CBC   Comprehensive metabolic panel   Urinalysis, Routine w reflex microscopic     Genitourinary  Benign prostatic hyperplasia without lower urinary tract symptoms   Relevant Medications   tamsulosin (FLOMAX) 0.4 MG CAPS capsule   finasteride (PROSCAR) 5 MG tablet   Other Relevant Orders   PSA   Urinalysis, Routine w reflex microscopic     Other   Alcohol use   Colon cancer screening   Relevant Orders   Ambulatory  referral to Gastroenterology   Need for pneumococcal vaccination   Relevant Orders   Pneumococcal conjugate vaccine 20-valent (Prevnar 20) (Completed)    Meds ordered this encounter  Medications   tamsulosin (FLOMAX) 0.4 MG CAPS capsule    Sig: Take 1 capsule (0.4 mg total) by mouth daily.    Dispense:  90 capsule    Refill:  0   finasteride (PROSCAR) 5 MG tablet    Sig: Take 1 tablet (5 mg total) by mouth daily.    Dispense:  90 tablet    Refill:  0   ezetimibe (ZETIA) 10 MG tablet    Sig: Take 1 tablet (10 mg total) by mouth daily.    Dispense:  90 tablet    Refill:  3    Follow-up: Return in about 6 months (around 09/17/2021).    Libby Maw, MD

## 2021-05-07 ENCOUNTER — Other Ambulatory Visit: Payer: Self-pay | Admitting: Family Medicine

## 2021-05-07 DIAGNOSIS — N400 Enlarged prostate without lower urinary tract symptoms: Secondary | ICD-10-CM

## 2021-05-12 ENCOUNTER — Other Ambulatory Visit: Payer: Self-pay | Admitting: Family Medicine

## 2021-05-14 DIAGNOSIS — N132 Hydronephrosis with renal and ureteral calculous obstruction: Secondary | ICD-10-CM | POA: Diagnosis not present

## 2021-05-14 DIAGNOSIS — R109 Unspecified abdominal pain: Secondary | ICD-10-CM | POA: Diagnosis not present

## 2021-05-14 DIAGNOSIS — N200 Calculus of kidney: Secondary | ICD-10-CM | POA: Diagnosis not present

## 2021-05-14 DIAGNOSIS — N1330 Unspecified hydronephrosis: Secondary | ICD-10-CM | POA: Diagnosis not present

## 2021-05-14 DIAGNOSIS — I714 Abdominal aortic aneurysm, without rupture, unspecified: Secondary | ICD-10-CM | POA: Diagnosis not present

## 2021-05-14 DIAGNOSIS — N400 Enlarged prostate without lower urinary tract symptoms: Secondary | ICD-10-CM | POA: Diagnosis not present

## 2021-05-14 DIAGNOSIS — K429 Umbilical hernia without obstruction or gangrene: Secondary | ICD-10-CM | POA: Diagnosis not present

## 2021-05-14 DIAGNOSIS — R319 Hematuria, unspecified: Secondary | ICD-10-CM | POA: Diagnosis not present

## 2021-05-20 DIAGNOSIS — R1084 Generalized abdominal pain: Secondary | ICD-10-CM | POA: Diagnosis not present

## 2021-05-20 DIAGNOSIS — R8271 Bacteriuria: Secondary | ICD-10-CM | POA: Diagnosis not present

## 2021-05-20 DIAGNOSIS — N201 Calculus of ureter: Secondary | ICD-10-CM | POA: Diagnosis not present

## 2021-05-22 ENCOUNTER — Other Ambulatory Visit: Payer: Self-pay | Admitting: Family Medicine

## 2021-05-22 DIAGNOSIS — I10 Essential (primary) hypertension: Secondary | ICD-10-CM

## 2021-05-29 ENCOUNTER — Other Ambulatory Visit: Payer: Self-pay

## 2021-05-29 VITALS — BP 121/58 | HR 58

## 2021-05-29 DIAGNOSIS — Z006 Encounter for examination for normal comparison and control in clinical research program: Secondary | ICD-10-CM

## 2021-05-29 NOTE — Research (Signed)
[facility] Informed Consent   Subject Name: Patrick [name]  Subject met inclusion and exclusion criteria.  The informed consent form, study requirements and expectations were reviewed with the subject and questions and concerns were addressed prior to the signing of the consent form.  The subject verbalized understanding of the trial requirements.  The subject agreed to participate in the [facility] trial and signed the informed consent at 0905 on 05/29/2021.  The informed consent was obtained prior to performance of any protocol-specific procedures for the subject.  A copy of the signed informed consent was given to the subject and a copy was placed in the subject's medical record.   [place]  [facility] Consent Version 2 Protocol Version 1 amendment 1

## 2021-05-29 NOTE — Research (Signed)
Patrick [name] DEMOGRAPHICS [id number]      SITE [id number] SUBJECT ID: [id number]    DATE:  05/29/2021        [x]  MALE                            []  MALE AGE: 66 ETHINICITY:   []  HISPANIC/LATINO      [x]  NON- HISPANIC/LATINO RACE:           [x]   WHITE             []  BLACK/AFRICAN AMERICAN                         []   ASIAN              []  AMERICAN INDIAN/[place] NATIVE                         []   NATIVE [place]/OTHER PACIFIC ISLANDER                         []   OTHER  FUTURE RESEARCH [x]  USE OF SAMPLES FOR FUTURE RESEARCH [x]  PHARMACOGENTIC (GENETIC) [place] Screening Visit [phone]                        The following was completed during visit: [x]  CONSENT SIGNED [x]  INCLUSION/EXCLUSION MET [x]  MEASUREMENTS TAKEN HEIGHT:  6'                 WEIGHT:182 WAIST CIRMCUMFERENCE: 104 cm               HIP CIRCUMFERENCE: 111 cm B/P:   121/58                         HR: 58 []  SERUM PREGNANCY TEST/FSH---NA [x]  FASTING GLUCOSE/HbA1c [x]  FASTING LIPID PANELY [x]  LPa [x]  CHEMISTRY/HEMATOLOGY [x]  MEDICATIONS REVIEWED [x]  DEMOGRAPHICS    Current Outpatient Medications:    aspirin EC 81 MG tablet, Take 81 mg by mouth daily. Swallow whole., Disp: , Rfl:    amLODipine (NORVASC) 5 MG tablet, TAKE ONE TABLET BY MOUTH ONE TIME DAILY, Disp: 90 tablet, Rfl: 0   clopidogrel (PLAVIX) 75 MG tablet, Take 1 tablet (75 mg total) by mouth daily., Disp: 90 tablet, Rfl: 2   Evolocumab (REPATHA SURECLICK) 741 MG/ML SOAJ, Inject 1 pen into the skin every 14 (fourteen) days., Disp: 6 mL, Rfl: 3   ezetimibe (ZETIA) 10 MG tablet, Take 1 tablet (10 mg total) by mouth daily., Disp: 90 tablet, Rfl: 3   finasteride (PROSCAR) 5 MG tablet, Take 1 tablet (5 mg total) by mouth daily., Disp: 90 tablet, Rfl: 0   nitroGLYCERIN (NITROSTAT) 0.4 MG SL tablet, Place 1 tablet (0.4 mg total) under the tongue every 5 (five) minutes as needed for chest pain., Disp: 90 tablet, Rfl: 3   rosuvastatin (CRESTOR) 40 MG  tablet, TAKE ONE TABLET BY MOUTH DAILY IN THE EVENING, Disp: 90 tablet, Rfl: 0   tamsulosin (FLOMAX) 0.4 MG CAPS capsule, TAKE ONE CAPSULE BY MOUTH ONE TIME DAILY, Disp: 90 capsule, Rfl: 1

## 2021-06-05 NOTE — Research (Addendum)
° ° ° ° ° ° ° °  ABNORMAL LABS: TEST RESULT  LDL 36  cholesterol 108  Apo B 60      Are these clinically significant? []  YES              [x]  NO   Pixie Casino, MD, [facility], [place] Director of the Advanced Lipid Disorders &  Cardiovascular Risk Reduction Clinic Diplomate of the American Board of Clinical Lipidology Attending Cardiologist  Direct Dial: [phone]   Fax: [phone]  Website:  [email or web address]

## 2021-06-06 DIAGNOSIS — N201 Calculus of ureter: Secondary | ICD-10-CM | POA: Diagnosis not present

## 2021-06-07 NOTE — Research (Addendum)
° °  Screening visit [name]  Are these clinically significant? [x]  YES              []  No  Pixie Casino, MD, [facility], [place] Director of the Advanced Lipid Disorders &  Cardiovascular Risk Reduction Clinic Diplomate of the American Board of Clinical Lipidology Attending Cardiologist  Direct Dial: [phone]   Fax: [phone]  Website:  [email or web address]

## 2021-06-12 DIAGNOSIS — E785 Hyperlipidemia, unspecified: Secondary | ICD-10-CM | POA: Diagnosis not present

## 2021-06-12 DIAGNOSIS — I10 Essential (primary) hypertension: Secondary | ICD-10-CM | POA: Diagnosis not present

## 2021-06-12 DIAGNOSIS — H93A3 Pulsatile tinnitus, bilateral: Secondary | ICD-10-CM | POA: Diagnosis not present

## 2021-06-12 DIAGNOSIS — I2510 Atherosclerotic heart disease of native coronary artery without angina pectoris: Secondary | ICD-10-CM | POA: Diagnosis not present

## 2021-06-12 DIAGNOSIS — Z7902 Long term (current) use of antithrombotics/antiplatelets: Secondary | ICD-10-CM | POA: Diagnosis not present

## 2021-06-12 DIAGNOSIS — Z7982 Long term (current) use of aspirin: Secondary | ICD-10-CM | POA: Diagnosis not present

## 2021-06-12 DIAGNOSIS — Z87891 Personal history of nicotine dependence: Secondary | ICD-10-CM | POA: Diagnosis not present

## 2021-06-12 DIAGNOSIS — N400 Enlarged prostate without lower urinary tract symptoms: Secondary | ICD-10-CM | POA: Diagnosis not present

## 2021-06-13 ENCOUNTER — Ambulatory Visit: Payer: PPO | Admitting: Cardiology

## 2021-06-26 DIAGNOSIS — Z1283 Encounter for screening for malignant neoplasm of skin: Secondary | ICD-10-CM | POA: Diagnosis not present

## 2021-06-26 DIAGNOSIS — X32XXXA Exposure to sunlight, initial encounter: Secondary | ICD-10-CM | POA: Diagnosis not present

## 2021-06-26 DIAGNOSIS — D225 Melanocytic nevi of trunk: Secondary | ICD-10-CM | POA: Diagnosis not present

## 2021-07-02 ENCOUNTER — Encounter: Payer: Self-pay | Admitting: Cardiology

## 2021-07-02 ENCOUNTER — Other Ambulatory Visit: Payer: Self-pay

## 2021-07-02 ENCOUNTER — Ambulatory Visit (INDEPENDENT_AMBULATORY_CARE_PROVIDER_SITE_OTHER): Payer: PPO | Admitting: Cardiology

## 2021-07-02 VITALS — BP 106/65 | HR 56 | Ht 72.0 in | Wt 185.2 lb

## 2021-07-02 DIAGNOSIS — I2510 Atherosclerotic heart disease of native coronary artery without angina pectoris: Secondary | ICD-10-CM

## 2021-07-02 DIAGNOSIS — E785 Hyperlipidemia, unspecified: Secondary | ICD-10-CM

## 2021-07-02 DIAGNOSIS — E782 Mixed hyperlipidemia: Secondary | ICD-10-CM

## 2021-07-02 NOTE — Progress Notes (Addendum)
?Cardiology Office Note:   ? ?Date:  07/04/2021  ? ?ID:  Patrick [name], DOB 06-13-1955, MRN [id number] ? ?PCP:  Libby Maw, MD  ?Cardiologist:  Berniece Salines, DO  ?Electrophysiologist:  None  ? ?Referring MD: Libby Maw,*  ? ? ? ?History of Present Illness:   ? ?Patrick [name] is a 66 y.o. male with a hx of coronary artery disease status post PCI to the RCA and circumflex with atherectomy in the RCA as well on dual antiplatelet therapy with aspirin and Plavix, hyperlipidemia now on PCSK9 inhibitors, history of TIA is here today for follow-up visit. ?  ?I initially saw the patient on October 11, 2020 at that time he was experiencing intermittent chest discomfort and given his history of coronary artery disease I recommended he undergo a left heart catheterization.  He is now status post 2 drug-eluting stent which was placed on November 13, 2020. ? ?I saw the patient in December 27, 2021 at that time he was status post DES with plans to continue his dual antiplatelet therapy for 12 months.  He had also been started on Repatha we continue that along with his Zetia as well as statin ? ?Since I saw the patient he tells me he is doing well. ? ? ?Past Medical History:  ?Diagnosis Date  ? Coronary artery disease   ? moderate  ? Hyperlipidemia   ? TIA (transient ischemic attack)   ? ? ?Past Surgical History:  ?Procedure Laterality Date  ? CORONARY ATHERECTOMY N/A 11/13/2020  ? Procedure: CORONARY ATHERECTOMY;  Surgeon: Sherren Mocha, MD;  Location: [place] [facility];  Service: Cardiovascular;  Laterality: N/A;  ? INTRAVASCULAR PRESSURE WIRE/FFR STUDY N/A 11/02/2020  ? Procedure: INTRAVASCULAR PRESSURE WIRE/FFR STUDY;  Surgeon: Sherren Mocha, MD;  Location: [place] [facility];  Service: Cardiovascular;  Laterality: N/A;  ? LEFT HEART CATH AND CORONARY ANGIOGRAPHY N/A 11/02/2020  ? Procedure: LEFT HEART CATH AND CORONARY ANGIOGRAPHY;  Surgeon: Sherren Mocha, MD;  Location: [place] [facility];   Service: Cardiovascular;  Laterality: N/A;  ? MANDIBULAR HARDWARE REMOVAL N/A 02/21/2016  ? Procedure: MANDIBULAR HARDWARE REMOVAL;  Surgeon: Wallace Going, DO;  Location: [place];  Service: Plastics;  Laterality: N/A;  ? ORIF MANDIBULAR FRACTURE N/A 01/07/2016  ? Procedure: OPEN REDUCTION INTERNAL FIXATION (ORIF) MANDIBULAR FRACTURE;  Surgeon: Loel Lofty Dillingham, DO;  Location: [place];  Service: Plastics;  Laterality: N/A;  ? PLACEMENT OF LUMBAR DRAIN N/A 01/14/2016  ? Procedure: PLACEMENT OF LUMBAR DRAIN;  Surgeon: Newman Pies, MD;  Location: [place];  Service: Neurosurgery;  Laterality: N/A;  ? ? ?Current Medications: ?Current Meds  ?Medication Sig  ? amLODipine (NORVASC) 5 MG tablet TAKE ONE TABLET BY MOUTH ONE TIME DAILY  ? aspirin EC 81 MG tablet Take 81 mg by mouth daily. Swallow whole.  ? clopidogrel (PLAVIX) 75 MG tablet Take 1 tablet (75 mg total) by mouth daily.  ? Evolocumab (REPATHA SURECLICK) 932 MG/ML SOAJ Inject 1 pen into the skin every 14 (fourteen) days.  ? ezetimibe (ZETIA) 10 MG tablet Take 1 tablet (10 mg total) by mouth daily.  ? finasteride (PROSCAR) 5 MG tablet Take 1 tablet (5 mg total) by mouth daily.  ? nitroGLYCERIN (NITROSTAT) 0.4 MG SL tablet Place 1 tablet (0.4 mg total) under the tongue every 5 (five) minutes as needed for chest pain.  ? rosuvastatin (CRESTOR) 40 MG tablet TAKE ONE TABLET BY MOUTH DAILY IN THE EVENING  ? tamsulosin (FLOMAX)  0.4 MG CAPS capsule TAKE ONE CAPSULE BY MOUTH ONE TIME DAILY (Patient taking differently: in the morning and at bedtime.)  ?  ? ?Allergies:   Patient has no known allergies.  ? ?Social History  ? ?Socioeconomic History  ? Marital status: Married  ?  Spouse name: Not on file  ? Number of children: Not on file  ? Years of education: Not on file  ? Highest education level: Not on file  ?Occupational History  ? Not on file  ?Tobacco Use  ? Smoking status: Former  ?  Types: Cigarettes  ?  Quit date: 01/06/1995  ?  Years since  quitting: 26.5  ? Smokeless tobacco: Never  ?Substance and Sexual Activity  ? Alcohol use: Yes  ? Drug use: No  ? Sexual activity: Not on file  ?Other Topics Concern  ? Not on file  ?Social History Narrative  ? Not on file  ? ?Social Determinants of Health  ? ?Financial Resource Strain: Low Risk   ? Difficulty of Paying Living Expenses: Not hard at all  ?Food Insecurity: No Food Insecurity  ? Worried About Charity fundraiser in the Last Year: Never true  ? Ran Out of Food in the Last Year: Never true  ?Transportation Needs: No Transportation Needs  ? Lack of Transportation (Medical): No  ? Lack of Transportation (Non-Medical): No  ?Physical Activity: Sufficiently Active  ? Days of Exercise per Week: 5 days  ? Minutes of Exercise per Session: 40 min  ?Stress: No Stress Concern Present  ? Feeling of Stress : Not at all  ?Social Connections: Moderately Integrated  ? Frequency of Communication with Friends and Family: Twice a week  ? Frequency of Social Gatherings with Friends and Family: Twice a week  ? Attends Religious Services: 1 to 4 times per year  ? Active Member of Clubs or Organizations: No  ? Attends Archivist Meetings: Never  ? Marital Status: Married  ?  ? ?Family History: ?The patient's family history includes Cancer in his mother; Heart disease in his father. ? ?ROS:   ?Review of Systems  ?Constitution: Negative for decreased appetite, fever and weight gain.  ?HENT: Negative for congestion, ear discharge, hoarse voice and sore throat.   ?Eyes: Negative for discharge, redness, vision loss in right eye and visual halos.  ?Cardiovascular: Negative for chest pain, dyspnea on exertion, leg swelling, orthopnea and palpitations.  ?Respiratory: Negative for cough, hemoptysis, shortness of breath and snoring.   ?Endocrine: Negative for heat intolerance and polyphagia.  ?Hematologic/Lymphatic: Negative for bleeding problem. Does not bruise/bleed easily.  ?Skin: Negative for flushing, nail changes, rash  and suspicious lesions.  ?Musculoskeletal: Negative for arthritis, joint pain, muscle cramps, myalgias, neck pain and stiffness.  ?Gastrointestinal: Negative for abdominal pain, bowel incontinence, diarrhea and excessive appetite.  ?Genitourinary: Negative for decreased libido, genital sores and incomplete emptying.  ?Neurological: Negative for brief paralysis, focal weakness, headaches and loss of balance.  ?Psychiatric/Behavioral: Negative for altered mental status, depression and suicidal ideas.  ?Allergic/Immunologic: Negative for HIV exposure and persistent infections.  ? ? ?EKGs/Labs/Other Studies Reviewed:   ? ?The following studies were reviewed today: ? ? ?EKG:  The ekg ordered today demonstrates sinus rhythm, heart rate 71 bpm. ? ?Transthoracic echocardiogram performed on October 31, 2020 ?IMPRESSIONS  ? ? ? 1. Left ventricular ejection fraction, by estimation, is 50 to 55%. The  ?left ventricle has low normal function. The left ventricle has no regional  ?wall motion abnormalities. There is  mild asymmetric left ventricular  ?hypertrophy. Left ventricular  ?diastolic parameters were normal.  ? 2. Right ventricular systolic function is normal. The right ventricular  ?size is normal. Tricuspid regurgitation signal is inadequate for assessing  ?PA pressure.  ? 3. The mitral valve is grossly normal. No evidence of mitral valve  ?regurgitation. No evidence of mitral stenosis.  ? 4. The aortic valve is tricuspid. There is mild calcification of the  ?aortic valve. There is mild thickening of the aortic valve. Aortic valve  ?regurgitation is not visualized. No aortic stenosis is present.  ? 5. The inferior vena cava is normal in size with greater than 50%  ?respiratory variability, suggesting right atrial pressure of 3 mmHg.  ? ?Comparison(s): A prior study was performed on 07/20/2006. Prior images  ?reviewed side by side. LV is slightly less vigorous from prior.  ? ?FINDINGS  ? Left Ventricle: Left ventricular  ejection fraction, by estimation, is 50  ?to 55%. The left ventricle has low normal function. The left ventricle has  ?no regional wall motion abnormalities. The left ventricular internal  ?cavity size was normal in s

## 2021-07-02 NOTE — Patient Instructions (Signed)

## 2021-07-08 ENCOUNTER — Encounter: Payer: Self-pay | Admitting: *Deleted

## 2021-07-08 DIAGNOSIS — Z006 Encounter for examination for normal comparison and control in clinical research program: Secondary | ICD-10-CM

## 2021-07-08 NOTE — Research (Signed)
Message left for Patrick [name] to remind him of his appointment tomorrow at 0900 with research, also nothing to eat or drink, and the parking code. Encouraged him to call back with any questions.  ?

## 2021-07-09 DIAGNOSIS — Z006 Encounter for examination for normal comparison and control in clinical research program: Secondary | ICD-10-CM

## 2021-07-09 MED ORDER — STUDY - OCEAN(A) - OLPASIRAN (AMG 890) 142 MG/ML OR PLACEBO SQ INJECTION (PI-HILTY)
142.0000 mg | PREFILLED_SYRINGE | Freq: Once | SUBCUTANEOUS | Status: AC
  Administered 2021-07-09: 142 mg via SUBCUTANEOUS
  Filled 2021-07-09: qty 1

## 2021-07-09 NOTE — Research (Addendum)
Patient was seen in the clinic for Randomization visit for [name] trial. Reviewed informed consent, medications and inclusion/exclusion criteria with patient. He denies any adverse events since last visit. ECG and lab work were completed per the protocol. IP injection was given. Patient tolerated all procedures well without complaints. Patient did what in clinic after IP injection was given. Next appointment is Week 4 and was made on 08/27/2021. Patient to return between 2-3 pm today for PK lab draw [name] IP ADMINISTRATION Subject [phone] IP Box #:[id number] IP lot#: [id number] Time Given: 2440 Administration site: left arm Weight: 181.6 lbs 1500: Patient returned to clinic for PK lab draw. Lab was drawn at 1504.  Current Outpatient Medications:    amLODipine (NORVASC) 5 MG tablet, TAKE ONE TABLET BY MOUTH ONE TIME DAILY, Disp: 90 tablet, Rfl: 0   aspirin EC 81 MG tablet, Take 81 mg by mouth daily. Swallow whole., Disp: , Rfl:    clopidogrel (PLAVIX) 75 MG tablet, Take 1 tablet (75 mg total) by mouth daily., Disp: 90 tablet, Rfl: 2   Evolocumab (REPATHA SURECLICK) 102 MG/ML SOAJ, Inject 1 pen into the skin every 14 (fourteen) days., Disp: 6 mL, Rfl: 3   ezetimibe (ZETIA) 10 MG tablet, Take 1 tablet (10 mg total) by mouth daily., Disp: 90 tablet, Rfl: 3   finasteride (PROSCAR) 5 MG tablet, Take 1 tablet (5 mg total) by mouth daily., Disp: 90 tablet, Rfl: 0   rosuvastatin (CRESTOR) 40 MG tablet, TAKE ONE TABLET BY MOUTH DAILY IN THE EVENING, Disp: 90 tablet, Rfl: 0   tamsulosin (FLOMAX) 0.4 MG CAPS capsule, TAKE ONE CAPSULE BY MOUTH ONE TIME DAILY (Patient taking differently: in the morning and at bedtime.), Disp: 90 capsule, Rfl: 1   nitroGLYCERIN (NITROSTAT) 0.4 MG SL tablet, Place 1 tablet (0.4 mg total) under the tongue every 5 (five) minutes as needed for chest pain., Disp: 90 tablet, Rfl: 3

## 2021-07-09 NOTE — Research (Signed)
AMGEN [id number] SITE #: [id number] SUBJECT ID#: ?[id number]  ?                  ELIGIBLITY CRITERIA WORKSHEET ?                            INCLUSION CRITERIA ?PROVIDED INFORMED CONSENT [x]   ?AGE 67 TO ? 85 [x]   ?Lp(a) ? 200 nmol/L during screening by central lab [x]   ?MI (presumed type 1 event due to plaque rupture       AND/OR []   ?PCI stenting AND one of the following: [x]   ?AGE ? 65 [x]   ?Residual coronary artery stenosis > 50% in any major vessel []   ?Multivessel PCI [x]   ?Symptomatic peripheral arterial disease with either (a) intermittent claudication with ankle-brachial index (ABI) < 0.85, or (b) peripheral arterial revascularization or amputation due to atherosclerotic disease []   ?Ischemic stroke (presumed atherosclerotic in origin) []   ?DM    HbA1c:  _________________ []   ?                     EXCLUSION CRITERIA           N/A                          [x]  ?Severe renal function eGFR < 7m/min/1.73 m? []   ?Active liver disease, known hepatitis, or any hepatic dysfunction, defined as AST or ALT > 3 x upper limit of normal, or total bilirubin > 2 x ULN during screening []   ?History of hemorrhagic stroke []   ?History of major bleeding disorder []   ?Major cardiovascular event within 4 weeks prior to Lp(a) screening or during screening []   ?Planned cardiac or arterial revascularization []   ?Malignancy within the last 5 years prior to Study day 1 (except non-melanoma skin cancers, cervical and breast ductal carcinoma in situ, or stage 1 prostate) []   ?Severe Heart Failure Class IV, and/or left EF < 30% []   ?Uncontrolled or recurrent Ventricular tachycardia in the last 3 months []   ?A fib/flutter not on therapeutic anticoagulation or having placement of left atrial appendage closure device []   ?Uncontrolled HTN at screening, systolic > 1283TDVVor diastolic > 1616WVPX[]   ?Fasting triglycerides > 4044mdL during screening []   ?HbA1c > 10% []   ?Know major active infection []   ?Current or planned lipoprotein apheresis or <  3 months since last apheresis []   ?Previously received RNA therapy specifically targeting Lp(a) []   ? OCEANa DEMOGRAPHICS ?[id number]    SITE [id number] ID: [id number]                      ?[x]  MALE                            []  MALE ?AGE: [age]ETHINICITY:   []  HISPANIC/LATINO      []  NON- HISPANIC/LATINO ?RACE:           [x]   WHITE             []  BLACK/AFRICAN AMERICAN ?                        []   ASIAN              []  AMERICAN INDIAN/[place] NATIVE ?                        []   NATIVE [place]/OTHER PACIFIC ISLANDER ?                        []   OTHER ? ?FUTURE RESEARCH ?[x]  USE OF SAMPLES FOR FUTURE RESEARCH ?[x]  PHARMACOGENTIC (GENETIC) RESEARCH  ?

## 2021-07-09 NOTE — Research (Addendum)
Ellwood Sayers Informed Consent  ? ?Subject Name: Patrick [name] ? ?Subject met inclusion and exclusion criteria.  The informed consent form, study requirements and expectations were reviewed with the subject and questions and concerns were addressed prior to the signing of the consent form.  The subject verbalized understanding of the trial requirements.  The subject agreed to participate in the [facility] trial and signed the informed consent at 0921 on 07/09/2021.  The informed consent was obtained prior to performance of any protocol-specific procedures for the subject.  A copy of the signed informed consent was given to the subject and a copy was placed in the subject's medical record.  ? ?Chanda Busing ? ?Amgen Consent Version 3 ?Protocol Version 1 amendment 1   ? ? ?Patient seen and chart reviewed, patient examined.  Patient is a candidate for the [place] a trial.  ?Denies current symptoms.  Patient has prior LPa of >014DCVU, and implications reviewed in detail.  Drug, purpose of study, and randomization also reviewed.  ? ?Exam 110/55 P54 R nonlabored.  Afebrile.  ?No JVD.  Lungs clear ?Cardiac regular without murmurs or gallops ?Abd soft.   ?No edema ? ?Patient meets criteria for the trial, and wishes to proceed.  All questions answered.  ? ?Loretha Brasil. Lia Foyer, MD, [facility] ?Medical Director, [facility] ?

## 2021-07-11 NOTE — Research (Signed)
[name] Randomization Visit Lab Results ? ? ? ? ? ?No abnormal labs ?

## 2021-07-11 NOTE — Progress Notes (Addendum)
SEE NOTE ? ?Patrick Bow, MD. ? ?Patient seen and examined.  Trial protocol, purpose, phase II study, potential risks and benefits, and need for randomization all reviewed with patient in detail.  He is currently asymptomatic and has LPa in excess of 500.  He wishes to proceed.  ? ?VS as noted..  Lungs clear.  Cor regular without murmur or rub.  Abd soft. Ext no edema.  Neuro non focal ? ?Case discussed with Dr. Harriet Masson.   ?Patient will be randomized in [place] a.   ? ?Patrick [name].Patrick Gullick MD ?Medical Director, [facility] ?

## 2021-07-12 NOTE — Research (Addendum)
[name] randomization visit lab results ? ? ? ?ABNORMAL LABS: ?TEST RESULT  ?cholesterol 92  ?LDL fried 29  ?Apo B 50  ?   ? ?Are these clinically significant? ?'[]'$  YES              '[x]'$  NO  ? ?Pixie Casino, MD, [facility], FACP  ?[place]  ?Medical Director of the Advanced Lipid Disorders &  ?Cardiovascular Risk Reduction Clinic ?Diplomate of the [employer] of Clinical Lipidology ?Attending Cardiologist  ?Direct Dial: [phone]  Fax: [phone]  ?Website:  [email or web address] ? ?

## 2021-07-17 NOTE — Research (Signed)
[name] RANDOMIZATION VISIT LAB RESULTS ? ? ?

## 2021-07-18 NOTE — Research (Signed)
[name]  RANDOMIZATION VISIT LAB RESULT ?SUBJECT #[id number] ? ? ?

## 2021-08-12 DIAGNOSIS — Z006 Encounter for examination for normal comparison and control in clinical research program: Secondary | ICD-10-CM

## 2021-08-12 NOTE — Research (Signed)
Patient was seen in the research clinic today for week 4 visit of the [name] trial. Denies any adverse events nor hospitalizations since last visit. Reviewed all medications and patient states he stopped his amlodipine 2 weeks ago because his blood pressure was staying low. Instructed patient to inform his doctor of this. He verbalized understanding and stated that he is checking his blood pressure daily. Lab work was completed per protocol and patient tolerated without complaints. Next visit appointment made for Week 12 on June 20th. ?

## 2021-08-14 NOTE — Research (Addendum)
[name] week 4 visit [id number] ? ? ? ? ? ?ABNORMAL LABS: ?TEST RESULT  ?glucose 107  ?   ?   ?   ? ?Are these clinically significant? ?'[]'$  YES              '[x]'$  NO  ? ?Pixie Casino, MD, [facility], FACP  ?[place]  ?Medical Director of the Advanced Lipid Disorders &  ?Cardiovascular Risk Reduction Clinic ?Diplomate of the [employer] of Clinical Lipidology ?Attending Cardiologist  ?Direct Dial: [phone]  Fax: [phone]  ?Website:  [email or web address] ? ?

## 2021-08-15 NOTE — Research (Signed)
[name] week for Lab results [id number] ? ? ? ?

## 2021-08-27 DIAGNOSIS — R350 Frequency of micturition: Secondary | ICD-10-CM | POA: Diagnosis not present

## 2021-08-27 DIAGNOSIS — N401 Enlarged prostate with lower urinary tract symptoms: Secondary | ICD-10-CM | POA: Diagnosis not present

## 2021-08-27 DIAGNOSIS — N200 Calculus of kidney: Secondary | ICD-10-CM | POA: Diagnosis not present

## 2021-09-07 ENCOUNTER — Other Ambulatory Visit: Payer: Self-pay | Admitting: Family Medicine

## 2021-09-19 ENCOUNTER — Ambulatory Visit: Payer: PPO | Admitting: Family Medicine

## 2021-09-24 VITALS — BP 117/59 | HR 71

## 2021-09-24 DIAGNOSIS — Z006 Encounter for examination for normal comparison and control in clinical research program: Secondary | ICD-10-CM

## 2021-09-24 MED ORDER — STUDY - OCEAN(A) - OLPASIRAN (AMG 890) 142 MG/ML OR PLACEBO SQ INJECTION (PI-HILTY)
142.0000 mg | PREFILLED_SYRINGE | Freq: Once | SUBCUTANEOUS | Status: AC
  Administered 2021-09-24: 142 mg via SUBCUTANEOUS
  Filled 2021-09-24: qty 1

## 2021-09-24 NOTE — Research (Signed)
Patient seen in the research clinic today for week 12 visit of the [name] trial.Denies any adverse events or hospitalizations since last visit. Patient states that he has stopped his Norvasc because his blood pressures have been within normal limits since his stent was placed. He states that he hasn't informed his doctor but checks his blood pressure regularly and did check it daily when he stopped. I encouraged him to please call and inform his MD of this. He verbalized understanding. Reviewed all concomitant medications with patient and no other changes noted. IP and lab work completed per protocol and patient tolerated well without any adverse events. Next appointment scheduled for Sept 14 @ 0930. Patient will return later today for PK lab draw.  1145- Patient returned and PK lab drawn  [place]) IP ADMINISTRATION Subject [phone] IP Box #: [id number] IP lot#: [id number] Time Given: 5573 Administration site: left arm  Current Outpatient Medications:    aspirin EC 81 MG tablet, Take 81 mg by mouth daily. Swallow whole., Disp: , Rfl:    Evolocumab (REPATHA SURECLICK) 220 MG/ML SOAJ, Inject 1 pen into the skin every 14 (fourteen) days., Disp: 6 mL, Rfl: 3   ezetimibe (ZETIA) 10 MG tablet, Take 1 tablet (10 mg total) by mouth daily., Disp: 90 tablet, Rfl: 3   finasteride (PROSCAR) 5 MG tablet, Take 1 tablet (5 mg total) by mouth daily., Disp: 90 tablet, Rfl: 0   rosuvastatin (CRESTOR) 40 MG tablet, TAKE ONE TABLET BY MOUTH DAILY IN THE EVENING, Disp: 90 tablet, Rfl: 0   amLODipine (NORVASC) 5 MG tablet, TAKE ONE TABLET BY MOUTH ONE TIME DAILY (Patient not taking: Reported on 08/12/2021), Disp: 90 tablet, Rfl: 0   nitroGLYCERIN (NITROSTAT) 0.4 MG SL tablet, Place 1 tablet (0.4 mg total) under the tongue every 5 (five) minutes as needed for chest pain., Disp: 90 tablet, Rfl: 3   tamsulosin (FLOMAX) 0.4 MG CAPS capsule, TAKE ONE CAPSULE BY MOUTH ONE TIME DAILY (Patient taking differently: in the  morning and at bedtime.), Disp: 90 capsule, Rfl: 1  Current Facility-Administered Medications:    Study - [name] - olpasiran (AMG 890) 142 mg/mL or placebo SQ injection (PI-[place]), 142 mg, Subcutaneous, Once, [place], Nadean Corwin, MD

## 2021-09-26 NOTE — Research (Signed)
[name] wk 12 subject # [id number]

## 2021-10-04 ENCOUNTER — Encounter: Payer: Self-pay | Admitting: Family Medicine

## 2021-10-04 ENCOUNTER — Ambulatory Visit (INDEPENDENT_AMBULATORY_CARE_PROVIDER_SITE_OTHER): Payer: PPO | Admitting: Family Medicine

## 2021-10-04 VITALS — BP 118/66 | HR 68 | Temp 97.1°F | Ht 72.0 in | Wt 184.0 lb

## 2021-10-04 DIAGNOSIS — Z789 Other specified health status: Secondary | ICD-10-CM

## 2021-10-04 DIAGNOSIS — I2583 Coronary atherosclerosis due to lipid rich plaque: Secondary | ICD-10-CM

## 2021-10-04 DIAGNOSIS — N400 Enlarged prostate without lower urinary tract symptoms: Secondary | ICD-10-CM

## 2021-10-04 DIAGNOSIS — I2510 Atherosclerotic heart disease of native coronary artery without angina pectoris: Secondary | ICD-10-CM | POA: Diagnosis not present

## 2021-10-04 DIAGNOSIS — I10 Essential (primary) hypertension: Secondary | ICD-10-CM

## 2021-10-04 MED ORDER — AMLODIPINE BESYLATE 2.5 MG PO TABS: 2.5000 mg | ORAL_TABLET | Freq: Every day | ORAL | 2 refills | Status: DC

## 2021-10-04 NOTE — Progress Notes (Signed)
Established Patient Office Visit  Subjective   Patient ID: Patrick [name], male    DOB: [birth year]  Age: 66 y.o. MRN: [id number]  Chief Complaint  Patient presents with   Follow-up    6 month follow up, no concerns. Patient stopped BP meds 1 month ago... fasting.     HPI for follow-up of hypertension associated with coronary artery disease, BPH and alcohol use.  Decreased alcohol intake further to a couple of beers on the weekend days.  Blood pressure has been running low and decided to discontinue his amlodipine.  Status post discontinue blood pressure continues to run in the 1 teens over 60-70s.  He had discussed this with his son-in-law who is a hospitalist in [place].  Continues tamsulosin for BPH with good effect.  He denies lightheadedness dizziness chest pain shortness of breath or dyspnea    Review of Systems  Constitutional: Negative.   HENT: Negative.    Eyes:  Negative for blurred vision, discharge and redness.  Respiratory: Negative.    Cardiovascular: Negative.   Gastrointestinal:  Negative for abdominal pain.  Genitourinary: Negative.   Musculoskeletal: Negative.  Negative for myalgias.  Skin:  Negative for rash.  Neurological:  Negative for tingling, loss of consciousness and weakness.  Endo/Heme/Allergies:  Negative for polydipsia.      Objective:     BP 118/66 (BP Location: Right Arm, Patient Position: Sitting, Cuff Size: Normal)   Pulse 68   Temp (!) 97.1 F (36.2 C) (Temporal)   Ht 6' (1.829 m)   Wt 184 lb (83.5 kg)   SpO2 95%   BMI 24.95 kg/m    Physical Exam Constitutional:      General: He is not in acute distress.    Appearance: Normal appearance. He is not ill-appearing, toxic-appearing or diaphoretic.  HENT:     Head: Normocephalic and atraumatic.     Right Ear: External ear normal.     Left Ear: External ear normal.     Mouth/Throat:     Mouth: Mucous membranes are moist.     Pharynx: Oropharynx is clear. No oropharyngeal  exudate or posterior oropharyngeal erythema.  Eyes:     General: No scleral icterus.       Right eye: No discharge.        Left eye: No discharge.     Extraocular Movements: Extraocular movements intact.     Conjunctiva/sclera: Conjunctivae normal.     Pupils: Pupils are equal, round, and reactive to light.  Cardiovascular:     Rate and Rhythm: Normal rate and regular rhythm.  Pulmonary:     Effort: Pulmonary effort is normal. No respiratory distress.     Breath sounds: Normal breath sounds.  Abdominal:     General: Bowel sounds are normal.     Tenderness: There is no abdominal tenderness. There is no guarding.  Musculoskeletal:     Cervical back: No rigidity or tenderness.     Right lower leg: No edema.     Left lower leg: No edema.  Skin:    General: Skin is warm and dry.  Neurological:     Mental Status: He is alert and oriented to person, place, and time.  Psychiatric:        Mood and Affect: Mood normal.        Behavior: Behavior normal.      No results found for any visits on 10/04/21.    The ASCVD Risk score (Arnett DK, et al., 2019) failed to  calculate for the following reasons:   The patient has a prior MI or stroke diagnosis    Assessment & Plan:   Problem List Items Addressed This Visit       Cardiovascular and Mediastinum   Coronary artery disease due to lipid rich plaque   Relevant Medications   amLODipine (NORVASC) 2.5 MG tablet   Essential hypertension - Primary   Relevant Medications   amLODipine (NORVASC) 2.5 MG tablet     Genitourinary   Benign prostatic hyperplasia without lower urinary tract symptoms     Other   Alcohol use    Return in about 6 months (around 04/05/2022), or Start Amlodipine 2.'5mg'$  daily.  Explained that the amlodipine is helping to keep his coronary artery bed dilated and that he should continue it.  I have written for the 2-1/2 mg tablet.  He has follow-up scheduled with cardiology soon.  Libby Maw, MD

## 2021-10-08 ENCOUNTER — Emergency Department (HOSPITAL_BASED_OUTPATIENT_CLINIC_OR_DEPARTMENT_OTHER)
Admission: EM | Admit: 2021-10-08 | Discharge: 2021-10-08 | Disposition: A | Discharge status: Home / Self Care | Payer: PPO | Attending: Emergency Medicine | Admitting: Emergency Medicine

## 2021-10-08 ENCOUNTER — Encounter (HOSPITAL_BASED_OUTPATIENT_CLINIC_OR_DEPARTMENT_OTHER): Payer: Self-pay

## 2021-10-08 ENCOUNTER — Other Ambulatory Visit: Payer: Self-pay

## 2021-10-08 DIAGNOSIS — K0889 Other specified disorders of teeth and supporting structures: Secondary | ICD-10-CM | POA: Diagnosis present

## 2021-10-08 DIAGNOSIS — Z7902 Long term (current) use of antithrombotics/antiplatelets: Secondary | ICD-10-CM | POA: Diagnosis not present

## 2021-10-08 DIAGNOSIS — Z7982 Long term (current) use of aspirin: Secondary | ICD-10-CM | POA: Insufficient documentation

## 2021-10-08 DIAGNOSIS — Z79899 Other long term (current) drug therapy: Secondary | ICD-10-CM | POA: Diagnosis not present

## 2021-10-08 DIAGNOSIS — K120 Recurrent oral aphthae: Secondary | ICD-10-CM | POA: Diagnosis not present

## 2021-10-08 DIAGNOSIS — I2510 Atherosclerotic heart disease of native coronary artery without angina pectoris: Secondary | ICD-10-CM | POA: Diagnosis not present

## 2021-10-08 MED ORDER — VALACYCLOVIR HCL 500 MG PO TABS
1000.0000 mg | ORAL_TABLET | Freq: Once | ORAL | Status: AC
  Administered 2021-10-08: 1000 mg via ORAL
  Filled 2021-10-08: qty 2

## 2021-10-08 MED ORDER — VALACYCLOVIR HCL 1 G PO TABS: 1000.0000 mg | ORAL_TABLET | Freq: Once | ORAL | 0 refills | Status: AC

## 2021-10-08 NOTE — Discharge Instructions (Signed)
Evert Kohl ofReturn to ED with any new or worsening symptoms such as continued pain, oral ulcer after antiviral medication Please follow-up with your PCP Please take second dose tomorrow Please read attached guide concerning oral ulcers

## 2021-10-08 NOTE — ED Provider Notes (Signed)
[name] Provider Note   CSN: [id number] Arrival date & time: 10/08/21  1808     History  Chief Complaint  Patient presents with   Dental Pain    Patrick [name] is a 66 y.o. male with medical history sniffing and for hyperlipidemia, coronary artery disease, TIA.  Patient presents to ED for evaluation of lower leg pain.  Patient states that for the last 3 to 4 days he has had body aches and chills along with the appearance of an ulcer on the inside of his lower lip.  The patient is also reporting subjective tenderness on the left side of his face that localizes at his ear. Patient states he often sees dentist, last saw dentist 2 months ago.  Patient denies any fevers, nausea, vomiting, trouble swallowing, shortness of breath, drooling.   Dental Pain Associated symptoms: oral lesions   Associated symptoms: no drooling and no fever        Home Medications Prior to Admission medications   Medication Sig Start Date End Date Taking? Authorizing Provider  valACYclovir (VALTREX) 1000 MG tablet Take 1 tablet (1,000 mg total) by mouth once for 1 dose. 10/08/21 10/08/21 Yes Azucena Cecil, PA-C  amLODipine (NORVASC) 2.5 MG tablet Take 1 tablet (2.5 mg total) by mouth daily. 10/04/21   Libby Maw, MD  aspirin EC 81 MG tablet Take 81 mg by mouth daily. Swallow whole.    [provider]  clopidogrel (PLAVIX) 75 MG tablet Take 75 mg by mouth daily. 09/14/21   [provider]  Evolocumab (REPATHA SURECLICK) 222 MG/ML SOAJ Inject 1 pen into the skin every 14 (fourteen) days. 11/23/20   Tobb, Kardie, DO  ezetimibe (ZETIA) 10 MG tablet Take 1 tablet (10 mg total) by mouth daily. 03/19/21   Libby Maw, MD  finasteride (PROSCAR) 5 MG tablet Take 1 tablet (5 mg total) by mouth daily. 03/19/21   Libby Maw, MD  nitroGLYCERIN (NITROSTAT) 0.4 MG SL tablet Place 1 tablet (0.4 mg total) under the tongue every 5 (five)  minutes as needed for chest pain. 10/11/20 10/04/21  Tobb, Kardie, DO  rosuvastatin (CRESTOR) 40 MG tablet TAKE ONE TABLET BY MOUTH DAILY IN THE EVENING 09/09/21   Libby Maw, MD  tamsulosin (FLOMAX) 0.4 MG CAPS capsule TAKE ONE CAPSULE BY MOUTH ONE TIME DAILY Patient taking differently: in the morning and at bedtime. 05/07/21   Libby Maw, MD      Allergies    Patient has no known allergies.    Review of Systems   Review of Systems  Constitutional:  Positive for chills. Negative for fever.  HENT:  Positive for ear pain and mouth sores. Negative for drooling, ear discharge, sore throat and trouble swallowing.   Respiratory:  Negative for shortness of breath.   All other systems reviewed and are negative.   Physical Exam Updated Vital Signs BP 130/80 (BP Location: Left Arm)   Pulse 83   Temp 98.2 F (36.8 C) (Oral)   Resp 18   Ht 6' (1.829 m)   Wt 82.6 kg   SpO2 95%   BMI 24.68 kg/m  Physical Exam Vitals and nursing note reviewed.  Constitutional:      General: He is not in acute distress.    Appearance: He is not toxic-appearing.  HENT:     Head: Normocephalic and atraumatic.     Right Ear: Tympanic membrane normal.     Left Ear: Tympanic membrane  normal.     Nose: Nose normal. No congestion.     Mouth/Throat:     Mouth: Mucous membranes are moist. Oral lesions present.     Comments: 5 shallow lesions with surrounding erythema.  Possibly HSV, possible aphthous sores. Eyes:     Extraocular Movements: Extraocular movements intact.     Conjunctiva/sclera: Conjunctivae normal.     Pupils: Pupils are equal, round, and reactive to light.  Cardiovascular:     Rate and Rhythm: Normal rate and regular rhythm.  Pulmonary:     Effort: Pulmonary effort is normal. No respiratory distress.     Breath sounds: Normal breath sounds.  Abdominal:     General: Abdomen is flat.     Palpations: Abdomen is soft.     Tenderness: There is no abdominal tenderness.   Musculoskeletal:     Cervical back: Normal range of motion and neck supple. No tenderness.  Skin:    General: Skin is warm and dry.     Capillary Refill: Capillary refill takes less than 2 seconds.     Coloration: Skin is not jaundiced or pale.  Neurological:     Mental Status: He is alert and oriented to person, place, and time.  Psychiatric:        Behavior: Behavior normal.     ED Results / Procedures / Treatments   Labs (all labs ordered are listed, but only abnormal results are displayed) Labs Reviewed - No data to display  EKG None  Radiology No results found.  Procedures Procedures   Medications Ordered in ED Medications  valACYclovir (VALTREX) tablet 1,000 mg (1,000 mg Oral Given 10/08/21 1900)    ED Course/ Medical Decision Making/ A&P                           Medical Decision Making Risk Prescription drug management.   66 year old male presents to ED for evaluation of oral ulcers.  Please see HPI for further details.  On examination, the patient is afebrile and nontachycardic.  The patient lung sounds are clear bilaterally, he is not hypoxic on room air.  The patient is handling secretions appropriately, he is not drooling, there is no trismus.  The patient has 5 punched-out shallow lesions to the internal side of his bottom lip with surrounding erythema.  This could be consistent with HSV or aphthous ulcers.  The patient will be placed on 2 days of Valtrex out of an abundance of caution and advised to follow-up with his PCP.  Patient had all of his questions answered to his satisfaction.  Patient given return precautions and he voiced understanding. The patient is stable at this time for discharge home.  Final Clinical Impression(s) / ED Diagnoses Final diagnoses:  Aphthous ulcer of mouth    Rx / DC Orders ED Discharge Orders          Ordered    valACYclovir (VALTREX) 1000 MG tablet   Once        10/08/21 1858              Azucena Cecil, PA-C 10/08/21 1909    Drenda Freeze, MD 10/08/21 2113

## 2021-10-08 NOTE — ED Triage Notes (Signed)
Patient c/o dental/oral pain possible abscess x 2 days.

## 2021-10-11 ENCOUNTER — Telehealth: Payer: Self-pay | Admitting: Family Medicine

## 2021-10-11 NOTE — Telephone Encounter (Signed)
Pt went to urgent care yesterday and was told he has shingles. The urgent care only prescribed 2 pills of valcyclovir '1mg'$ . They need more called in they are hoping today.

## 2021-10-12 ENCOUNTER — Emergency Department (HOSPITAL_BASED_OUTPATIENT_CLINIC_OR_DEPARTMENT_OTHER)
Admission: EM | Admit: 2021-10-12 | Discharge: 2021-10-12 | Disposition: A | Discharge status: Home / Self Care | Payer: PPO | Attending: Emergency Medicine | Admitting: Emergency Medicine

## 2021-10-12 ENCOUNTER — Other Ambulatory Visit: Payer: Self-pay

## 2021-10-12 ENCOUNTER — Encounter (HOSPITAL_BASED_OUTPATIENT_CLINIC_OR_DEPARTMENT_OTHER): Payer: Self-pay | Admitting: Emergency Medicine

## 2021-10-12 DIAGNOSIS — B029 Zoster without complications: Secondary | ICD-10-CM | POA: Insufficient documentation

## 2021-10-12 DIAGNOSIS — Z79899 Other long term (current) drug therapy: Secondary | ICD-10-CM | POA: Insufficient documentation

## 2021-10-12 DIAGNOSIS — Z7982 Long term (current) use of aspirin: Secondary | ICD-10-CM | POA: Insufficient documentation

## 2021-10-12 DIAGNOSIS — Z760 Encounter for issue of repeat prescription: Secondary | ICD-10-CM | POA: Diagnosis not present

## 2021-10-12 DIAGNOSIS — R21 Rash and other nonspecific skin eruption: Secondary | ICD-10-CM | POA: Diagnosis present

## 2021-10-12 MED ORDER — VALACYCLOVIR HCL 1 G PO TABS: 1000.0000 mg | ORAL_TABLET | Freq: Three times a day (TID) | ORAL | 0 refills | Status: DC

## 2021-10-12 MED ORDER — GABAPENTIN 100 MG PO CAPS: 100.0000 mg | ORAL_CAPSULE | Freq: Three times a day (TID) | ORAL | 0 refills | Status: DC

## 2021-10-12 NOTE — ED Triage Notes (Addendum)
Pt reports he needs a refill for valcyclovir for shingles. From the prescription bottle, it appears only one pill was prescribed from pharmacy.

## 2021-10-12 NOTE — ED Provider Notes (Signed)
[facility] Provider Note   CSN: [id number] Arrival date & time: 10/12/21  1304     History  Chief Complaint  Patient presents with   Medication Refill    Cristo Ausburn Mcclanahan is a 66 y.o. male.  The history is provided by the patient and medical records. No language interpreter was used.  Medication Refill   66 year old male presenting requesting for evaluation of a rash.  Patient report 4 days ago he developed a painful rash to his face on the right side.  He went to the ER to be evaluated and was prescribed 2 days of Valtrex.  He took the medication but the rash still persist.  Rash is described as a painful sore sensation, persistent, without any vision changes, fever, or headache.  He mention no improvement of his symptoms to his son who happens to be a physician and patient was recommended to come back to the ER for further assessment.  Patient has had chickenpox in the past.  Denies any change in facial products.  Home Medications Prior to Admission medications   Medication Sig Start Date End Date Taking? Authorizing Provider  amLODipine (NORVASC) 2.5 MG tablet Take 1 tablet (2.5 mg total) by mouth daily. 10/04/21   Libby Maw, MD  aspirin EC 81 MG tablet Take 81 mg by mouth daily. Swallow whole.    [provider]  clopidogrel (PLAVIX) 75 MG tablet Take 75 mg by mouth daily. 09/14/21   [provider]  Evolocumab (REPATHA SURECLICK) 122 MG/ML SOAJ Inject 1 pen into the skin every 14 (fourteen) days. 11/23/20   Tobb, Kardie, DO  ezetimibe (ZETIA) 10 MG tablet Take 1 tablet (10 mg total) by mouth daily. 03/19/21   Libby Maw, MD  finasteride (PROSCAR) 5 MG tablet Take 1 tablet (5 mg total) by mouth daily. 03/19/21   Libby Maw, MD  nitroGLYCERIN (NITROSTAT) 0.4 MG SL tablet Place 1 tablet (0.4 mg total) under the tongue every 5 (five) minutes as needed for chest pain. 10/11/20 10/04/21  Tobb, Kardie, DO   rosuvastatin (CRESTOR) 40 MG tablet TAKE ONE TABLET BY MOUTH DAILY IN THE EVENING 09/09/21   Libby Maw, MD  tamsulosin (FLOMAX) 0.4 MG CAPS capsule TAKE ONE CAPSULE BY MOUTH ONE TIME DAILY Patient taking differently: in the morning and at bedtime. 05/07/21   Libby Maw, MD      Allergies    Patient has no known allergies.    Review of Systems   Review of Systems  All other systems reviewed and are negative.   Physical Exam Updated Vital Signs BP 112/75   Pulse 74   Temp 98.1 F (36.7 C)   Resp 16   SpO2 98%  Physical Exam Vitals and nursing note reviewed.  Constitutional:      General: He is not in acute distress.    Appearance: He is well-developed.  HENT:     Head: Atraumatic.  Eyes:     Conjunctiva/sclera: Conjunctivae normal.  Musculoskeletal:     Cervical back: Normal range of motion and neck supple. No rigidity.  Skin:    Findings: No rash (Face:.  Multiple vesicular rashes noted to right side of face following dermatomal pattern from ears towards his lip with mucosal involvement.  No Hutchinson sign.  No ocular involvement.).  Neurological:     Mental Status: He is alert and oriented to person, place, and time.     ED Results / Procedures /  Treatments   Labs (all labs ordered are listed, but only abnormal results are displayed) Labs Reviewed - No data to display  EKG None  Radiology No results found.  Procedures Procedures    Medications Ordered in ED Medications - No data to display  ED Course/ Medical Decision Making/ A&P                           Medical Decision Making  BP 112/75   Pulse 74   Temp 98.1 F (36.7 C)   Resp 16   SpO2 98%   2:33 PM This is a 66 year old male presenting for evaluation of a rash.  Patient developed a painful facial rash to the right side of his face approximately 4 days ago.  He was seen in the ED for his complaint and I have reviewed patient's ER visit and considered in my plan of  care.  It was initially thought that patient may have signs of shingles versus aphthous ulcers and patient was given 2 days of Valtrex as treatment.  He is here with vesicular rash involving the right side of face consistent with shingles.  There are no ocular involvement or Hutchinson sign.  Doubt Ramsay Hunt no facial droop.  Plan to prescribe Valtrex for 7 days, gabapentin for nerve pain and encouraged patient to follow-up with PCP for reassessment.  I have considered herpetic ophthalmicus but felt it is less likely.  No headache neurodeficit, doubt herpetic meningitis.  Care discussed with Dr. Wyvonnia Dusky.          Final Clinical Impression(s) / ED Diagnoses Final diagnoses:  Herpes zoster without complication    Rx / DC Orders ED Discharge Orders          Ordered    valACYclovir (VALTREX) 1000 MG tablet  3 times daily        10/12/21 1438    gabapentin (NEURONTIN) 100 MG capsule  3 times daily        10/12/21 1438              Domenic Moras, PA-C 10/12/21 1439    Rancour, Annie Main, MD 10/12/21 [phone]

## 2021-10-14 ENCOUNTER — Telehealth: Payer: Self-pay | Admitting: Family Medicine

## 2021-10-14 NOTE — Telephone Encounter (Signed)
Appt scheduled

## 2021-10-15 ENCOUNTER — Ambulatory Visit (INDEPENDENT_AMBULATORY_CARE_PROVIDER_SITE_OTHER): Payer: PPO | Admitting: Family Medicine

## 2021-10-15 ENCOUNTER — Encounter: Payer: Self-pay | Admitting: Family Medicine

## 2021-10-15 VITALS — BP 118/72 | HR 63 | Temp 98.3°F | Wt 183.6 lb

## 2021-10-15 DIAGNOSIS — B029 Zoster without complications: Secondary | ICD-10-CM | POA: Diagnosis not present

## 2021-10-15 DIAGNOSIS — B0222 Postherpetic trigeminal neuralgia: Secondary | ICD-10-CM | POA: Diagnosis not present

## 2021-10-15 MED ORDER — GABAPENTIN 100 MG PO CAPS: 200.0000 mg | ORAL_CAPSULE | Freq: Three times a day (TID) | ORAL | 0 refills | Status: DC

## 2021-10-15 NOTE — Progress Notes (Signed)
Established Patient Office Visit  Subjective   Patient ID: Patrick [name], male    DOB: 1955-12-15  Age: 66 y.o. MRN: [id number]  Chief Complaint  Patient presents with   Acute Visit    Shingles Dx, pt seen at urgent care on 10/08/2021 and 10/12/2021    HPI for follow-up of a zoster rash that he developed on 4 July.  3 days into his 7-day treatment with Valtrex.  Tolerating it well.  On lower dose Neurontin.  Still experiences burning and stinging pains distribution of the rash.  Has not had the Shingrix vaccine yet.      Review of Systems  Constitutional: Negative.   HENT: Negative.    Eyes:  Negative for blurred vision, discharge and redness.  Respiratory: Negative.    Cardiovascular: Negative.   Gastrointestinal:  Negative for abdominal pain.  Genitourinary: Negative.   Musculoskeletal: Negative.  Negative for myalgias.  Skin:  Positive for itching and rash.  Neurological:  Positive for tingling. Negative for loss of consciousness, weakness and headaches.  Endo/Heme/Allergies:  Negative for polydipsia.      Objective:     BP 118/72 (BP Location: Left Arm, Patient Position: Sitting, Cuff Size: Normal)   Pulse 63   Temp 98.3 F (36.8 C) (Temporal)   Wt 183 lb 9.6 oz (83.3 kg)   SpO2 98%   BMI 24.90 kg/m    Physical Exam Constitutional:      General: He is not in acute distress.    Appearance: Normal appearance. He is not ill-appearing, toxic-appearing or diaphoretic.  HENT:     Head: Normocephalic and atraumatic.     Right Ear: Tympanic membrane, ear canal and external ear normal.     Left Ear: Tympanic membrane, ear canal and external ear normal.     Mouth/Throat:     Mouth: Mucous membranes are moist.     Pharynx: Oropharynx is clear. No oropharyngeal exudate or posterior oropharyngeal erythema.  Eyes:     General: No scleral icterus.       Right eye: No discharge.        Left eye: No discharge.     Extraocular Movements: Extraocular movements  intact.     Conjunctiva/sclera: Conjunctivae normal.     Pupils: Pupils are equal, round, and reactive to light.  Pulmonary:     Effort: Pulmonary effort is normal. No respiratory distress.  Musculoskeletal:     Cervical back: No rigidity or tenderness.  Skin:    General: Skin is warm and dry.       Neurological:     Mental Status: He is alert and oriented to person, place, and time.     Cranial Nerves: No dysarthria or facial asymmetry.  Psychiatric:        Mood and Affect: Mood normal.        Behavior: Behavior normal.      No results found for any visits on 10/15/21.    The ASCVD Risk score (Arnett DK, et al., 2019) failed to calculate for the following reasons:   The patient has a prior MI or stroke diagnosis    Assessment & Plan:   Problem List Items Addressed This Visit   None Visit Diagnoses     Herpes zoster without complication    -  Primary   Relevant Medications   gabapentin (NEURONTIN) 100 MG capsule   Acute trigeminal herpes zoster       Relevant Medications   gabapentin (NEURONTIN) 100 MG capsule  Return in about 1 week (around 10/22/2021).   Increasing gabapentin to 200 mg every 8. Libby Maw, MD

## 2021-10-18 NOTE — Telephone Encounter (Signed)
error 

## 2021-10-22 ENCOUNTER — Ambulatory Visit (INDEPENDENT_AMBULATORY_CARE_PROVIDER_SITE_OTHER): Payer: PPO | Admitting: Family Medicine

## 2021-10-22 ENCOUNTER — Encounter: Payer: Self-pay | Admitting: Family Medicine

## 2021-10-22 VITALS — BP 112/68 | HR 69 | Temp 97.8°F | Ht 72.0 in | Wt 184.0 lb

## 2021-10-22 DIAGNOSIS — I2510 Atherosclerotic heart disease of native coronary artery without angina pectoris: Secondary | ICD-10-CM

## 2021-10-22 DIAGNOSIS — B029 Zoster without complications: Secondary | ICD-10-CM | POA: Diagnosis not present

## 2021-10-22 DIAGNOSIS — I2583 Coronary atherosclerosis due to lipid rich plaque: Secondary | ICD-10-CM | POA: Diagnosis not present

## 2021-10-22 MED ORDER — CLOPIDOGREL BISULFATE 75 MG PO TABS: 75.0000 mg | ORAL_TABLET | Freq: Every day | ORAL | 1 refills | Status: DC

## 2021-10-22 MED ORDER — GABAPENTIN 400 MG PO CAPS: 400.0000 mg | ORAL_CAPSULE | Freq: Three times a day (TID) | ORAL | 1 refills | Status: DC

## 2021-10-22 MED ORDER — EZETIMIBE 10 MG PO TABS: 10.0000 mg | ORAL_TABLET | Freq: Every day | ORAL | 3 refills | Status: DC

## 2021-10-22 MED ORDER — TRAMADOL HCL 50 MG PO TABS: ORAL_TABLET | ORAL | 0 refills | Status: DC

## 2021-10-22 NOTE — Progress Notes (Signed)
Established Patient Office Visit  Subjective   Patient ID: Patrick [name], male    DOB: 09/27/1955  Age: 66 y.o. MRN: [id number]  Chief Complaint  Patient presents with   Follow-up    1 week follow up on shingles, some improvement still in pain with headache, swelling in face.     HPI for follow-up of zoster.  Rash is clearing but pain persists.  Pain is burning searing in the V2 distribution.  200 mg of Neurontin 3 times daily does not seem to be helping much.  Has been alternating Tylenol with ibuprofen with mild relief.  Needs refill for Plavix and Zetia.  Has follow-up next month with cardiology which will be a year after his drug-eluting stents were placed.    Review of Systems  Constitutional: Negative.   HENT: Negative.    Eyes:  Negative for blurred vision, discharge and redness.  Respiratory: Negative.    Cardiovascular: Negative.   Gastrointestinal:  Negative for abdominal pain.  Genitourinary: Negative.   Musculoskeletal: Negative.  Negative for myalgias.  Skin:  Positive for rash.  Neurological:  Negative for tingling, loss of consciousness and weakness.  Endo/Heme/Allergies:  Negative for polydipsia.      Objective:     BP 112/68 (BP Location: Right Arm, Patient Position: Sitting, Cuff Size: Large)   Pulse 69   Temp 97.8 F (36.6 C) (Temporal)   Ht 6' (1.829 m)   Wt 184 lb (83.5 kg)   SpO2 97%   BMI 24.95 kg/m    Physical Exam Constitutional:      General: He is not in acute distress.    Appearance: Normal appearance. He is not ill-appearing, toxic-appearing or diaphoretic.  HENT:     Head: Normocephalic and atraumatic.     Right Ear: External ear normal.     Left Ear: External ear normal.  Eyes:     General: No scleral icterus.       Right eye: No discharge.        Left eye: No discharge.     Extraocular Movements: Extraocular movements intact.     Conjunctiva/sclera: Conjunctivae normal.  Pulmonary:     Effort: Pulmonary effort is  normal. No respiratory distress.  Musculoskeletal:     Cervical back: No rigidity or tenderness.  Skin:    General: Skin is warm and dry.       Neurological:     Mental Status: He is alert and oriented to person, place, and time.  Psychiatric:        Mood and Affect: Mood normal.        Behavior: Behavior normal.      No results found for any visits on 10/22/21.    The ASCVD Risk score (Arnett DK, et al., 2019) failed to calculate for the following reasons:   The patient has a prior MI or stroke diagnosis    Assessment & Plan:   Problem List Items Addressed This Visit       Cardiovascular and Mediastinum   Coronary artery disease due to lipid rich plaque   Relevant Medications   ezetimibe (ZETIA) 10 MG tablet   clopidogrel (PLAVIX) 75 MG tablet   Other Visit Diagnoses     Herpes zoster without complication    -  Primary   Relevant Medications   gabapentin (NEURONTIN) 400 MG capsule   traMADol (ULTRAM) 50 MG tablet       Return in about 3 weeks (around 11/12/2021).  Increase Neurontin to  400 3 times daily.  Have added Ultram at night.  Refilled clopidogrel for a month.  May be able to discontinue it.  Continue Zetia.  Libby Maw, MD

## 2021-11-12 ENCOUNTER — Encounter: Payer: Self-pay | Admitting: Family Medicine

## 2021-11-12 ENCOUNTER — Ambulatory Visit (INDEPENDENT_AMBULATORY_CARE_PROVIDER_SITE_OTHER): Payer: PPO | Admitting: Family Medicine

## 2021-11-12 VITALS — BP 124/70 | HR 65 | Temp 97.4°F | Ht 72.0 in | Wt 187.0 lb

## 2021-11-12 DIAGNOSIS — B029 Zoster without complications: Secondary | ICD-10-CM

## 2021-11-12 DIAGNOSIS — B958 Unspecified staphylococcus as the cause of diseases classified elsewhere: Secondary | ICD-10-CM | POA: Diagnosis not present

## 2021-11-12 DIAGNOSIS — B0222 Postherpetic trigeminal neuralgia: Secondary | ICD-10-CM | POA: Diagnosis not present

## 2021-11-12 DIAGNOSIS — L0390 Cellulitis, unspecified: Secondary | ICD-10-CM | POA: Diagnosis not present

## 2021-11-12 MED ORDER — DOXYCYCLINE HYCLATE 100 MG PO TABS: 100.0000 mg | ORAL_TABLET | Freq: Two times a day (BID) | ORAL | 0 refills | Status: AC

## 2021-11-12 MED ORDER — GABAPENTIN 400 MG PO CAPS: 400.0000 mg | ORAL_CAPSULE | Freq: Four times a day (QID) | ORAL | 1 refills | Status: AC

## 2021-11-12 MED ORDER — TRAMADOL HCL 50 MG PO TABS: ORAL_TABLET | ORAL | 0 refills | Status: DC

## 2021-11-12 NOTE — Progress Notes (Signed)
Established Patient Office Visit  Subjective   Patient ID: Patrick [name], male    DOB: 1956/01/23  Age: 66 y.o. MRN: [id number]  Chief Complaint  Patient presents with   Follow-up    Follow up on shingle, much improvement. Patient would also like possible insect bites on legs.     HPI follow-up for trigeminal zoster.  Seems to be slowly improving.  Still has intermittent pains but they are not so sharp and stabbing as they were before.  Continues with Neurontin and tramadol at nighttime.  2-day history of a pruritic rash on his lower legs.  This came up after he been hiking.  He did not feel bites.  Some of the lesions have drained pus.  His wife is also affected.    Review of Systems  Constitutional: Negative.   HENT: Negative.    Eyes:  Negative for blurred vision, discharge and redness.  Respiratory: Negative.    Cardiovascular: Negative.   Gastrointestinal:  Negative for abdominal pain.  Genitourinary: Negative.   Musculoskeletal: Negative.  Negative for myalgias.  Skin:  Positive for itching and rash.  Neurological:  Negative for tingling, loss of consciousness and weakness.  Endo/Heme/Allergies:  Negative for polydipsia.      Objective:     BP 124/70 (BP Location: Right Arm, Patient Position: Sitting, Cuff Size: Normal)   Pulse 65   Temp (!) 97.4 F (36.3 C) (Temporal)   Ht 6' (1.829 m)   Wt 187 lb (84.8 kg)   SpO2 97%   BMI 25.36 kg/m    Physical Exam Constitutional:      General: He is not in acute distress.    Appearance: Normal appearance. He is not ill-appearing, toxic-appearing or diaphoretic.  HENT:     Head: Normocephalic and atraumatic.     Right Ear: External ear normal.     Left Ear: External ear normal.  Eyes:     General: No scleral icterus.       Right eye: No discharge.        Left eye: No discharge.     Extraocular Movements: Extraocular movements intact.     Conjunctiva/sclera: Conjunctivae normal.  Pulmonary:     Effort:  Pulmonary effort is normal. No respiratory distress.  Abdominal:     Tenderness: There is no guarding.  Skin:    General: Skin is warm and dry.       Neurological:     Mental Status: He is alert and oriented to person, place, and time.  Psychiatric:        Mood and Affect: Mood normal.        Behavior: Behavior normal.    Posterior medial knee was unroofed with a sterile 18-gauge needle.  It did drain clear serosanguineous fluid.  This was cultured.  Band-Aid was applied.  No results found for any visits on 11/12/21.    The ASCVD Risk score (Arnett DK, et al., 2019) failed to calculate for the following reasons:   The patient has a prior MI or stroke diagnosis    Assessment & Plan:   Problem List Items Addressed This Visit   None Visit Diagnoses     Acute trigeminal herpes zoster    -  Primary   Relevant Medications   gabapentin (NEURONTIN) 400 MG capsule   Cellulitis due to Staphylococcus       Relevant Medications   doxycycline (VIBRA-TABS) 100 MG tablet   Other Relevant Orders   Wound culture  Herpes zoster without complication       Relevant Medications   gabapentin (NEURONTIN) 400 MG capsule   traMADol (ULTRAM) 50 MG tablet       Return in about 4 weeks (around 12/10/2021), or if symptoms worsen or fail to improve.   Concerned about the possibility of staph cellulitis versus insect bite.  Will send culture and start doxycycline.  Sun sensitivity precautions were given continue Neurontin 4 times daily now.  Continue Ultram 1 or 2 at HS as needed. Libby Maw, MD

## 2021-11-16 LAB — WOUND CULTURE
MICRO NUMBER:: 13750581
RESULT:: NO GROWTH
SPECIMEN QUALITY:: ADEQUATE

## 2021-12-04 ENCOUNTER — Other Ambulatory Visit: Payer: Self-pay | Admitting: Family Medicine

## 2021-12-10 ENCOUNTER — Ambulatory Visit: Payer: PPO | Admitting: Family Medicine

## 2021-12-10 ENCOUNTER — Telehealth: Payer: Self-pay | Admitting: Family Medicine

## 2021-12-10 NOTE — Telephone Encounter (Signed)
Pt was a no show on 12/10/21 with Dr. Ethelene Hal for an OV, I sent a letter.

## 2021-12-13 NOTE — Telephone Encounter (Signed)
1st no show, fee waived, letter sent 

## 2021-12-15 ENCOUNTER — Other Ambulatory Visit: Payer: Self-pay | Admitting: Family Medicine

## 2021-12-15 DIAGNOSIS — I2510 Atherosclerotic heart disease of native coronary artery without angina pectoris: Secondary | ICD-10-CM

## 2021-12-18 MED ORDER — STUDY - OCEAN(A) - OLPASIRAN (AMG 890) 142 MG/ML OR PLACEBO SQ INJECTION (PI-HILTY)
142.0000 mg | PREFILLED_SYRINGE | Freq: Once | SUBCUTANEOUS | Status: AC
  Administered 2021-12-19: 142 mg via SUBCUTANEOUS
  Filled 2021-12-18 (×2): qty 1

## 2021-12-19 VITALS — BP 110/46 | Wt 189.4 lb

## 2021-12-19 DIAGNOSIS — Z006 Encounter for examination for normal comparison and control in clinical research program: Secondary | ICD-10-CM

## 2021-12-19 NOTE — Research (Signed)
Patient seen in research clinic today for week 24 visit of the [name] trial. Reviewed all concomitant medications with the patient. He was diagnosed with shingles on 10/12/2021 and was prescribed valtrex for seven day and gabapentin 3 times a day. This was updated to reflect this in the [facility]. No other events or medication changes noted at this time. Lab work and IP injection completed per protocol. Patient tolerated well without any complaints. Dr Lia Foyer examined patient during this visit as stated in the protocol. Next appt made for Dec 12 '@0930'$ .  [name] IP ADMINISTRATION Subject [phone] IP Box #:[id number] IP lot#: [id number] Time Given: 7543 Administration site: rt arm  Patient returned for PK lab draw at 1155.  Current Outpatient Medications:    amLODipine (NORVASC) 2.5 MG tablet, Take 1 tablet (2.5 mg total) by mouth daily., Disp: 90 tablet, Rfl: 2   aspirin EC 81 MG tablet, Take 81 mg by mouth daily. Swallow whole., Disp: , Rfl:    clopidogrel (PLAVIX) 75 MG tablet, TAKE ONE TABLET BY MOUTH ONE TIME DAILY, Disp: 90 tablet, Rfl: 0   Evolocumab (REPATHA SURECLICK) 606 MG/ML SOAJ, Inject 1 pen into the skin every 14 (fourteen) days., Disp: 6 mL, Rfl: 3   ezetimibe (ZETIA) 10 MG tablet, Take 1 tablet (10 mg total) by mouth daily., Disp: 90 tablet, Rfl: 3   finasteride (PROSCAR) 5 MG tablet, Take 1 tablet (5 mg total) by mouth daily., Disp: 90 tablet, Rfl: 0   gabapentin (NEURONTIN) 400 MG capsule, Take 1 capsule (400 mg total) by mouth 4 (four) times daily., Disp: 120 capsule, Rfl: 1   rosuvastatin (CRESTOR) 40 MG tablet, TAKE ONE TABLET BY MOUTH DAILY IN THE EVENING, Disp: 90 tablet, Rfl: 0   tamsulosin (FLOMAX) 0.4 MG CAPS capsule, TAKE ONE CAPSULE BY MOUTH ONE TIME DAILY (Patient taking differently: in the morning and at bedtime.), Disp: 90 capsule, Rfl: 1   nitroGLYCERIN (NITROSTAT) 0.4 MG SL tablet, Place 1 tablet (0.4 mg total) under the tongue every 5 (five) minutes as needed for  chest pain., Disp: 90 tablet, Rfl: 3   traMADol (ULTRAM) 50 MG tablet, May take 1-2 nightly as needed for pain (Patient not taking: Reported on 12/19/2021), Disp: 30 tablet, Rfl: 0  Current Facility-Administered Medications:    Study - [name] - olpasiran (AMG 890) 142 mg/mL or placebo SQ injection (PI-Hilty), 142 mg, Subcutaneous, Once, Hillary Bow, MD

## 2021-12-19 NOTE — Progress Notes (Signed)
Patient seen today for week 24 visit.  Doing well.  No major issues.  Patient has markedly elevated LPa but remains stable.  No medication issues at time of injections. Got shingles and had never had vaccine.   Alert oriented male in NAD BP 110/46 P65 R 18 unlabored Lungs clear to AP Cor regular without murmur Ext no edema   ECG  SB otherwise normal.  Stable week 24 [name] trial.  Plan continue in study.    Loretha Brasil. Lia Foyer, MD, [place] Director, [facility]

## 2021-12-23 NOTE — Research (Addendum)
[name] week 24 Lab results  [id number]       ABNORMAL LABS: TEST RESULT  monocyte 10.5            Are these clinically significant? '[]'$  YES              '[x]'$  NO   Pixie Casino, MD, [facility], [place] Director of the Advanced Lipid Disorders &  Cardiovascular Risk Reduction Clinic Diplomate of the American Board of Clinical Lipidology Attending Cardiologist  Direct Dial: [phone]  Fax: [phone]  Website:  [email or web address]

## 2021-12-25 NOTE — Research (Signed)
[name] week 24 Lab results [id number]

## 2022-01-14 DIAGNOSIS — X32XXXD Exposure to sunlight, subsequent encounter: Secondary | ICD-10-CM | POA: Diagnosis not present

## 2022-01-14 DIAGNOSIS — L570 Actinic keratosis: Secondary | ICD-10-CM | POA: Diagnosis not present

## 2022-02-17 ENCOUNTER — Other Ambulatory Visit: Payer: Self-pay | Admitting: Cardiology

## 2022-02-17 DIAGNOSIS — E785 Hyperlipidemia, unspecified: Secondary | ICD-10-CM

## 2022-02-17 DIAGNOSIS — I2510 Atherosclerotic heart disease of native coronary artery without angina pectoris: Secondary | ICD-10-CM

## 2022-03-05 ENCOUNTER — Encounter: Payer: Self-pay | Admitting: Cardiology

## 2022-03-06 ENCOUNTER — Other Ambulatory Visit: Payer: Self-pay | Admitting: Family Medicine

## 2022-03-06 DIAGNOSIS — N400 Enlarged prostate without lower urinary tract symptoms: Secondary | ICD-10-CM

## 2022-03-13 ENCOUNTER — Other Ambulatory Visit: Payer: Self-pay | Admitting: Family Medicine

## 2022-03-17 ENCOUNTER — Ambulatory Visit (INDEPENDENT_AMBULATORY_CARE_PROVIDER_SITE_OTHER): Payer: PPO

## 2022-03-17 VITALS — Ht 72.0 in | Wt 184.0 lb

## 2022-03-17 DIAGNOSIS — Z0000 Encounter for general adult medical examination without abnormal findings: Secondary | ICD-10-CM

## 2022-03-17 NOTE — Progress Notes (Signed)
I connected with Patrick [name] today by telephone and verified that I am speaking with the correct person using two identifiers. Location patient: home Location provider: work Persons participating in the virtual visit: [name], Burget LPN.   I discussed the limitations, risks, security and privacy concerns of performing an evaluation and management service by telephone and the availability of in person appointments. I also discussed with the patient that there may be a patient responsible charge related to this service. The patient expressed understanding and verbally consented to this telephonic visit.    Interactive audio and video telecommunications were attempted between this provider and patient, however failed, due to patient having technical difficulties OR patient did not have access to video capability.  We continued and completed visit with audio only.     Vital signs may be patient reported or missing.  Subjective:   Patrick [name] is a 66 y.o. male who presents for Medicare Annual/Subsequent preventive examination.  Review of Systems     Cardiac Risk Factors include: advanced age (>58mn, >>45women);dyslipidemia;hypertension;male gender     Objective:    Today's Vitals   03/17/22 1611  Weight: 184 lb (83.5 kg)  Height: 6' (1.829 m)   Body mass index is 24.95 kg/m.     03/17/2022    4:20 PM 10/12/2021    1:30 PM 03/05/2021    4:04 PM 11/13/2020    6:22 AM 11/02/2020    8:20 AM 02/14/2016   12:07 PM 02/14/2016   10:45 AM  Advanced Directives  Does Patient Have a Medical Advance Directive? No No No No No No No  Would patient like information on creating a medical advance directive?   No - Patient declined No - Patient declined Yes (MAU/Ambulatory/Procedural Areas - Information given)      Current Medications (verified) Outpatient Encounter Medications as of 03/17/2022  Medication Sig   amLODipine (NORVASC) 2.5 MG tablet Take 1 tablet (2.5 mg total) by  mouth daily.   aspirin EC 81 MG tablet Take 81 mg by mouth daily. Swallow whole.   ezetimibe (ZETIA) 10 MG tablet Take 1 tablet (10 mg total) by mouth daily.   finasteride (PROSCAR) 5 MG tablet TAKE ONE TABLET BY MOUTH ONE TIME DAILY   gabapentin (NEURONTIN) 400 MG capsule Take 1 capsule (400 mg total) by mouth 4 (four) times daily.   REPATHA SURECLICK 1676MG/ML SOAJ Inject 1 pen into the skin every 14 days   rosuvastatin (CRESTOR) 40 MG tablet Take 1 tablet by mouth daily in the evening   tamsulosin (FLOMAX) 0.4 MG CAPS capsule TAKE ONE CAPSULE BY MOUTH ONE TIME DAILY (Patient taking differently: in the morning and at bedtime.)   clopidogrel (PLAVIX) 75 MG tablet TAKE ONE TABLET BY MOUTH ONE TIME DAILY (Patient not taking: Reported on 03/17/2022)   nitroGLYCERIN (NITROSTAT) 0.4 MG SL tablet Place 1 tablet (0.4 mg total) under the tongue every 5 (five) minutes as needed for chest pain.   traMADol (ULTRAM) 50 MG tablet May take 1-2 nightly as needed for pain (Patient not taking: Reported on 12/19/2021)   No facility-administered encounter medications on file as of 03/17/2022.    Allergies (verified) Patient has no known allergies.   History: Past Medical History:  Diagnosis Date   Coronary artery disease    moderate   Hyperlipidemia    TIA (transient ischemic attack)    Past Surgical History:  Procedure Laterality Date   CORONARY ATHERECTOMY N/A 11/13/2020   Procedure: CORONARY ATHERECTOMY;  Surgeon: Sherren Mocha, MD;  Location: [place] [facility];  Service: Cardiovascular;  Laterality: N/A;   INTRAVASCULAR PRESSURE WIRE/FFR STUDY N/A 11/02/2020   Procedure: INTRAVASCULAR PRESSURE WIRE/FFR STUDY;  Surgeon: Sherren Mocha, MD;  Location: [place] [facility];  Service: Cardiovascular;  Laterality: N/A;   LEFT HEART CATH AND CORONARY ANGIOGRAPHY N/A 11/02/2020   Procedure: LEFT HEART CATH AND CORONARY ANGIOGRAPHY;  Surgeon: Sherren Mocha, MD;  Location: [place] [facility];  Service:  Cardiovascular;  Laterality: N/A;   MANDIBULAR HARDWARE REMOVAL N/A 02/21/2016   Procedure: MANDIBULAR HARDWARE REMOVAL;  Surgeon: Wallace Going, DO;  Location: [place];  Service: Plastics;  Laterality: N/A;   ORIF MANDIBULAR FRACTURE N/A 01/07/2016   Procedure: OPEN REDUCTION INTERNAL FIXATION (ORIF) MANDIBULAR FRACTURE;  Surgeon: Loel Lofty Dillingham, DO;  Location: [place];  Service: Plastics;  Laterality: N/A;   PLACEMENT OF LUMBAR DRAIN N/A 01/14/2016   Procedure: PLACEMENT OF LUMBAR DRAIN;  Surgeon: Newman Pies, MD;  Location: [place];  Service: Neurosurgery;  Laterality: N/A;   Family History  Problem Relation Age of Onset   Cancer Mother    Heart disease Father    Social History   Socioeconomic History   Marital status: Married    Spouse name: Not on file   Number of children: Not on file   Years of education: Not on file   Highest education level: Not on file  Occupational History   Not on file  Tobacco Use   Smoking status: Former    Types: Cigarettes    Quit date: 01/06/1995    Years since quitting: 27.2   Smokeless tobacco: Never  Vaping Use   Vaping Use: Never used  Substance and Sexual Activity   Alcohol use: Yes    Comment: occ   Drug use: No   Sexual activity: Not on file  Other Topics Concern   Not on file  Social History Narrative   Not on file   Social Determinants of Health   Financial Resource Strain: Low Risk  (03/17/2022)   Overall Financial Resource Strain (CARDIA)    Difficulty of Paying Living Expenses: Not hard at all  Food Insecurity: No Food Insecurity (03/17/2022)   Hunger Vital Sign    Worried About Running Out of Food in the Last Year: Never true    [place] in the Last Year: Never true  Transportation Needs: No Transportation Needs (03/17/2022)   PRAPARE - Hydrologist (Medical): No    Lack of Transportation (Non-Medical): No  Physical Activity: Sufficiently Active  (03/17/2022)   Exercise Vital Sign    Days of Exercise per Week: 3 days    Minutes of Exercise per Session: 60 min  Stress: No Stress Concern Present (03/17/2022)   [place]    Feeling of Stress : Only a little  Social Connections: Moderately Integrated (03/05/2021)   Social Connection and Isolation Panel [NHANES]    Frequency of Communication with Friends and Family: Twice a week    Frequency of Social Gatherings with Friends and Family: Twice a week    Attends Religious Services: 1 to 4 times per year    Active Member of [employer] or Organizations: No    Attends Archivist Meetings: Never    Marital Status: Married    Tobacco Counseling Counseling given: Not Answered   Clinical Intake:  Pre-visit preparation completed: Yes  Pain : No/denies  pain     Nutritional Status: BMI of 19-24  Normal Nutritional Risks: None Diabetes: No  How often do you need to have someone help you when you read instructions, pamphlets, or other written materials from your doctor or pharmacy?: 1 - Never  Diabetic? no  Interpreter Needed?: No  Information entered by :: NAllen LPN   Activities of Daily Living    03/17/2022    4:22 PM  In your present state of health, do you have any difficulty performing the following activities:  Hearing? 0  Vision? 0  Difficulty concentrating or making decisions? 0  Walking or climbing stairs? 0  Dressing or bathing? 0  Doing errands, shopping? 0  Preparing Food and eating ? N  Using the Toilet? N  In the past six months, have you accidently leaked urine? N  Do you have problems with loss of bowel control? N  Managing your Medications? N  Managing your Finances? N  Housekeeping or managing your Housekeeping? N    Patient Care Team: Libby Maw, MD as PCP - General (Family Medicine) Berniece Salines, DO as PCP - Cardiology (Cardiology)  Indicate any recent  Medical Services you may have received from other than [facility] providers in the past year (date may be approximate).     Assessment:   This is a routine wellness examination for Patrick [name].  Hearing/Vision screen Vision Screening - Comments:: No regular eye exams,   Dietary issues and exercise activities discussed: Current Exercise Habits: Home exercise routine, Type of exercise: Other - see comments (hiking), Time (Minutes): 60, Frequency (Times/Week): 3, Weekly Exercise (Minutes/Week): 180   Goals Addressed             This Visit's Progress    Patient Stated       03/17/2022, possibly join the gym       Depression Screen    03/17/2022    4:22 PM 11/12/2021    3:58 PM 10/22/2021    3:14 PM 10/04/2021    9:30 AM 03/19/2021   10:16 AM 03/05/2021    4:05 PM 03/05/2021    4:02 PM  PHQ 2/9 Scores  PHQ - 2 Score 0 0 0 0 0 0 0    Fall Risk    03/17/2022    4:21 PM 11/12/2021    3:58 PM 10/22/2021    3:15 PM 10/15/2021    1:51 PM 10/04/2021    9:30 AM  [place] in the past year? 0 0 0 0 0  Number falls in past yr: 0 0 0 0 0  Injury with Fall? 0   0   Risk for fall due to : Medication side effect      Follow up Falls prevention discussed;Education provided;Falls evaluation completed        FALL RISK PREVENTION PERTAINING TO THE HOME:  Any stairs in or around the home? Yes  If so, are there any without handrails? No  Home free of loose throw rugs in walkways, pet beds, electrical cords, etc? Yes  Adequate lighting in your home to reduce risk of falls? Yes   ASSISTIVE DEVICES UTILIZED TO PREVENT FALLS:  Life alert? No  Use of a cane, walker or w/c? No  Grab bars in the bathroom? Yes  Shower chair or bench in shower? No  Elevated toilet seat or a handicapped toilet? No   TIMED UP AND GO:  Was the test performed? No .      Cognitive Function:  03/17/2022    4:26 PM  6CIT Screen  What Year? 0 points  What month? 0 points  What time? 0 points   Count back from 20 0 points  Months in reverse 0 points  Repeat phrase 0 points  Total Score 0 points    Immunizations Immunization History  Administered Date(s) Administered   Fluad Quad(high Dose 65+) 03/15/2022   Influenza,inj,Quad PF,6+ Mos 01/08/2016   Influenza-Unspecified 01/08/2016, 02/16/2021   PFIZER(Purple Top)SARS-COV-2 Vaccination 06/16/2019, 07/07/2019, 01/15/2020, 01/06/2021, 02/22/2021   PNEUMOCOCCAL CONJUGATE-20 03/19/2021   Pneumococcal Polysaccharide-23 08/22/2016, 08/22/2016   Tdap 10/05/2019    TDAP status: Up to date  Flu Vaccine status: Up to date  Pneumococcal vaccine status: Up to date  Covid-19 vaccine status: Completed vaccines  Qualifies for Shingles Vaccine? Yes   Zostavax completed No   Shingrix Completed?: No.    Education has been provided regarding the importance of this vaccine. Patient has been advised to call insurance company to determine out of pocket expense if they have not yet received this vaccine. Advised may also receive vaccine at local pharmacy or Health Dept. Verbalized acceptance and understanding.  Screening Tests Health Maintenance  Topic Date Due   Hepatitis C Screening  Never done   Zoster Vaccines- Shingrix (1 of 2) Never done   Medicare Annual Wellness (AWV)  03/05/2022   COLONOSCOPY (Pts 45-16yr Insurance coverage will need to be confirmed)  08/19/2024   DTaP/Tdap/Td (2 - Td or Tdap) 10/04/2029   Pneumonia Vaccine 67 Years old  Completed   INFLUENZA VACCINE  Completed   HPV VACCINES  Aged Out   COVID-19 Vaccine  Discontinued    Health Maintenance  Health Maintenance Due  Topic Date Due   Hepatitis C Screening  Never done   Zoster Vaccines- Shingrix (1 of 2) Never done   Medicare Annual Wellness (AWV)  03/05/2022    Colorectal cancer screening: Type of screening: Colonoscopy. Completed 08/20/2014. Repeat every 10 years  Lung Cancer Screening: (Low Dose CT Chest recommended if Age 67-80years, 30 pack-year  currently smoking OR have quit w/in 15years.) does not qualify.   Lung Cancer Screening Referral: no  Additional Screening:  Hepatitis C Screening: does qualify;  Vision Screening: Recommended annual ophthalmology exams for early detection of glaucoma and other disorders of the eye. Is the patient up to date with their annual eye exam?  Yes  Who is the provider or what is the name of the office in which the patient attends annual eye exams? [facility] If pt is not established with a provider, would they like to be referred to a provider to establish care? No .   Dental Screening: Recommended annual dental exams for proper oral hygiene  Community Resource Referral / Chronic Care Management: CRR required this visit?  No   CCM required this visit?  No      Plan:     I have personally reviewed and noted the following in the patient's chart:   Medical and social history Use of alcohol, tobacco or illicit drugs  Current medications and supplements including opioid prescriptions. Patient is not currently taking opioid prescriptions. Functional ability and status Nutritional status Physical activity Advanced directives List of other physicians Hospitalizations, surgeries, and ER visits in previous 12 months Vitals Screenings to include cognitive, depression, and falls Referrals and appointments  In addition, I have reviewed and discussed with patient certain preventive protocols, quality metrics, and best practice recommendations. A written personalized care plan for preventive services  as well as general preventive health recommendations were provided to patient.     Kellie Simmering, LPN   [phone]   Nurse Notes: none  Due to this being a virtual visit, the after visit summary with patients personalized plan was offered to patient via mail or my-chart. Patient would like to access on my-chart

## 2022-03-17 NOTE — Patient Instructions (Signed)
Patrick [name] , Thank you for taking time to come for your Medicare Wellness Visit. I appreciate your ongoing commitment to your health goals. Please review the following plan we discussed and let me know if I can assist you in the future.   These are the goals we discussed:  Goals      Patient Stated     03/17/2022, possibly join the gym        This is a list of the screening recommended for you and due dates:  Health Maintenance  Topic Date Due   Hepatitis C Screening: USPSTF Recommendation to screen - Ages 19-79 yo.  Never done   Zoster (Shingles) Vaccine (1 of 2) Never done   Medicare Annual Wellness Visit  03/18/2023   Colon Cancer Screening  08/19/2024   DTaP/Tdap/Td vaccine (2 - Td or Tdap) 10/04/2029   Pneumonia Vaccine  Completed   Flu Shot  Completed   HPV Vaccine  Aged Out   COVID-19 Vaccine  Discontinued    Advanced directives: Advance directive discussed with you today.   Conditions/risks identified: none  Next appointment: Follow up in one year for your annual wellness visit.   Preventive Care 80 Years and Older, Male  Preventive care refers to lifestyle choices and visits with your health care provider that can promote health and wellness. What does preventive care include? A yearly physical exam. This is also called an annual well check. Dental exams once or twice a year. Routine eye exams. Ask your health care provider how often you should have your eyes checked. Personal lifestyle choices, including: Daily care of your teeth and gums. Regular physical activity. Eating a healthy diet. Avoiding tobacco and drug use. Limiting alcohol use. Practicing safe sex. Taking low doses of aspirin every day. Taking vitamin and mineral supplements as recommended by your health care provider. What happens during an annual well check? The services and screenings done by your health care provider during your annual well check will depend on your age, overall health,  lifestyle risk factors, and family history of disease. Counseling  Your health care provider may ask you questions about your: Alcohol use. Tobacco use. Drug use. Emotional well-being. Home and relationship well-being. Sexual activity. Eating habits. History of falls. Memory and ability to understand (cognition). Work and work Statistician. Screening  You may have the following tests or measurements: Height, weight, and BMI. Blood pressure. Lipid and cholesterol levels. These may be checked every 5 years, or more frequently if you are over 19 years old. Skin check. Lung cancer screening. You may have this screening every year starting at age 65 if you have a 30-pack-year history of smoking and currently smoke or have quit within the past 15 years. Fecal occult blood test (FOBT) of the stool. You may have this test every year starting at age 25. Flexible sigmoidoscopy or colonoscopy. You may have a sigmoidoscopy every 5 years or a colonoscopy every 10 years starting at age 10. Prostate cancer screening. Recommendations will vary depending on your family history and other risks. Hepatitis C blood test. Hepatitis B blood test. Sexually transmitted disease (STD) testing. Diabetes screening. This is done by checking your blood sugar (glucose) after you have not eaten for a while (fasting). You may have this done every 1-3 years. Abdominal aortic aneurysm (AAA) screening. You may need this if you are a current or former smoker. Osteoporosis. You may be screened starting at age 57 if you are at high risk. Talk with your  health care provider about your test results, treatment options, and if necessary, the need for more tests. Vaccines  Your health care provider may recommend certain vaccines, such as: Influenza vaccine. This is recommended every year. Tetanus, diphtheria, and acellular pertussis (Tdap, Td) vaccine. You may need a Td booster every 10 years. Zoster vaccine. You may need this  after age 32. Pneumococcal 13-valent conjugate (PCV13) vaccine. One dose is recommended after age 24. Pneumococcal polysaccharide (PPSV23) vaccine. One dose is recommended after age 44. Talk to your health care provider about which screenings and vaccines you need and how often you need them. This information is not intended to replace advice given to you by your health care provider. Make sure you discuss any questions you have with your health care provider. Document Released: 04/20/2015 Document Revised: 12/12/2015 Document Reviewed: 01/23/2015 Elsevier Interactive Patient Education  2017 [employer] Prevention in the Home Falls can cause injuries. They can happen to people of all ages. There are many things you can do to make your home safe and to help prevent falls. What can I do on the outside of my home? Regularly fix the edges of walkways and driveways and fix any cracks. Remove anything that might make you trip as you walk through a door, such as a raised step or threshold. Trim any bushes or trees on the path to your home. Use bright outdoor lighting. Clear any walking paths of anything that might make someone trip, such as rocks or tools. Regularly check to see if handrails are loose or broken. Make sure that both sides of any steps have handrails. Any raised decks and porches should have guardrails on the edges. Have any leaves, snow, or ice cleared regularly. Use sand or salt on walking paths during winter. Clean up any spills in your garage right away. This includes oil or grease spills. What can I do in the bathroom? Use night lights. Install grab bars by the toilet and in the tub and shower. Do not use towel bars as grab bars. Use non-skid mats or decals in the tub or shower. If you need to sit down in the shower, use a plastic, non-slip stool. Keep the floor dry. Clean up any water that spills on the floor as soon as it happens. Remove soap buildup in the tub or  shower regularly. Attach bath mats securely with double-sided non-slip rug tape. Do not have throw rugs and other things on the floor that can make you trip. What can I do in the bedroom? Use night lights. Make sure that you have a light by your bed that is easy to reach. Do not use any sheets or blankets that are too big for your bed. They should not hang down onto the floor. Have a firm chair that has side arms. You can use this for support while you get dressed. Do not have throw rugs and other things on the floor that can make you trip. What can I do in the kitchen? Clean up any spills right away. Avoid walking on wet floors. Keep items that you use a lot in easy-to-reach places. If you need to reach something above you, use a strong step stool that has a grab bar. Keep electrical cords out of the way. Do not use floor polish or wax that makes floors slippery. If you must use wax, use non-skid floor wax. Do not have throw rugs and other things on the floor that can make you trip. What  can I do with my stairs? Do not leave any items on the stairs. Make sure that there are handrails on both sides of the stairs and use them. Fix handrails that are broken or loose. Make sure that handrails are as long as the stairways. Check any carpeting to make sure that it is firmly attached to the stairs. Fix any carpet that is loose or worn. Avoid having throw rugs at the top or bottom of the stairs. If you do have throw rugs, attach them to the floor with carpet tape. Make sure that you have a light switch at the top of the stairs and the bottom of the stairs. If you do not have them, ask someone to add them for you. What else can I do to help prevent falls? Wear shoes that: Do not have high heels. Have rubber bottoms. Are comfortable and fit you well. Are closed at the toe. Do not wear sandals. If you use a stepladder: Make sure that it is fully opened. Do not climb a closed stepladder. Make  sure that both sides of the stepladder are locked into place. Ask someone to hold it for you, if possible. Clearly mark and make sure that you can see: Any grab bars or handrails. First and last steps. Where the edge of each step is. Use tools that help you move around (mobility aids) if they are needed. These include: Canes. Walkers. Scooters. Crutches. Turn on the lights when you go into a dark area. Replace any light bulbs as soon as they burn out. Set up your furniture so you have a clear path. Avoid moving your furniture around. If any of your floors are uneven, fix them. If there are any pets around you, be aware of where they are. Review your medicines with your doctor. Some medicines can make you feel dizzy. This can increase your chance of falling. Ask your doctor what other things that you can do to help prevent falls. This information is not intended to replace advice given to you by your health care provider. Make sure you discuss any questions you have with your health care provider. Document Released: 01/18/2009 Document Revised: 08/30/2015 Document Reviewed: 04/28/2014 Elsevier Interactive Patient Education  2017 [employer].

## 2022-03-18 ENCOUNTER — Encounter: Payer: PPO | Admitting: *Deleted

## 2022-03-18 VITALS — BP 109/63 | HR 63 | Temp 98.0°F | Resp 16

## 2022-03-18 DIAGNOSIS — Z006 Encounter for examination for normal comparison and control in clinical research program: Secondary | ICD-10-CM

## 2022-03-18 MED ORDER — STUDY - OCEAN(A) - OLPASIRAN (AMG 890) 142 MG/ML OR PLACEBO SQ INJECTION (PI-HILTY)
142.0000 mg | PREFILLED_SYRINGE | Freq: Once | SUBCUTANEOUS | Status: AC
  Administered 2022-03-18: 142 mg via SUBCUTANEOUS
  Filled 2022-03-18: qty 1

## 2022-03-18 NOTE — Research (Addendum)
Patient seen in clinic today for week  36 visit of [name] trial. Patient denies any adverse events since last visit. Reviewed medications with patient and no changes noted at this visit. All procedures and  Lab work completed per protocol and patient tolerated well without complaints. IP injection was given at 10:30 in the Right arm without any complications noted.  Next visit Week 48 scheduled for March 19th  [name] Informed Consent   Subject Name: Patrick [name]  Subject met inclusion and exclusion criteria.  The informed consent form, study requirements and expectations were reviewed with the subject and questions and concerns were addressed prior to the signing of the consent form.  The subject verbalized understanding of the trial requirements.  The subject agreed to participate in the ocean(a) trial and signed the informed consent at 9:30 on 03/18/2022.  The informed consent was obtained prior to performance of any protocol-specific procedures for the subject.  A copy of the signed informed consent was given to the subject and a copy was placed in the subject's medical record.     Subject re-consented to  Version: 5  [name]    Current Outpatient Medications:    amLODipine (NORVASC) 2.5 MG tablet, Take 1 tablet (2.5 mg total) by mouth daily., Disp: 90 tablet, Rfl: 2   aspirin EC 81 MG tablet, Take 81 mg by mouth daily. Swallow whole., Disp: , Rfl:    ezetimibe (ZETIA) 10 MG tablet, Take 1 tablet (10 mg total) by mouth daily., Disp: 90 tablet, Rfl: 3   finasteride (PROSCAR) 5 MG tablet, TAKE ONE TABLET BY MOUTH ONE TIME DAILY, Disp: 90 tablet, Rfl: 0   gabapentin (NEURONTIN) 400 MG capsule, Take 1 capsule (400 mg total) by mouth 4 (four) times daily., Disp: 120 capsule, Rfl: 1   REPATHA SURECLICK XX123456 MG/ML SOAJ, Inject 1 pen into the skin every 14 days, Disp: 6 mL, Rfl: 0   rosuvastatin (CRESTOR) 40 MG tablet, Take 1 tablet by mouth daily in the evening, Disp: 90  tablet, Rfl: 0   tamsulosin (FLOMAX) 0.4 MG CAPS capsule, TAKE ONE CAPSULE BY MOUTH ONE TIME DAILY (Patient taking differently: in the morning and at bedtime.), Disp: 90 capsule, Rfl: 1   clopidogrel (PLAVIX) 75 MG tablet, TAKE ONE TABLET BY MOUTH ONE TIME DAILY (Patient not taking: Reported on 03/17/2022), Disp: 90 tablet, Rfl: 0   nitroGLYCERIN (NITROSTAT) 0.4 MG SL tablet, Place 1 tablet (0.4 mg total) under the tongue every 5 (five) minutes as needed for chest pain., Disp: 90 tablet, Rfl: 3   traMADol (ULTRAM) 50 MG tablet, May take 1-2 nightly as needed for pain (Patient not taking: Reported on 12/19/2021), Disp: 30 tablet, Rfl: 0  Current Facility-Administered Medications:    Study - [name] - olpasiran (AMG 890) 142 mg/mL or placebo SQ injection (PI-Hilty), 142 mg, Subcutaneous, Once, Hilty, Nadean Corwin, MD   [facility]) IP ADMINISTRATION Subject [phone] IP Box [id number] IP [id number] Time Given:10:30 Administration site:Right arm

## 2022-03-24 NOTE — Research (Cosign Needed)
[name] week 36 lab results:        Are there any labs that are clinically significant?  Yes [] OR No[x]  Kenneth C. Hilty, MD, FACC, FACP  [place]  [facility]  Medical Director of the [facility] Lip[facility]  Cardiovascular Risk Reduction Clinic Diplomate of the American Board of Clinical Lipidology Attending Cardiologist  Direct Dial: [phone]  Fax: [phone]  Website:  [email or web address]    

## 2022-04-14 ENCOUNTER — Other Ambulatory Visit: Payer: PPO | Admitting: Family Medicine

## 2022-04-21 ENCOUNTER — Ambulatory Visit: Payer: PPO | Admitting: Family Medicine

## 2022-04-29 ENCOUNTER — Encounter: Payer: Self-pay | Admitting: Family Medicine

## 2022-04-29 ENCOUNTER — Ambulatory Visit (INDEPENDENT_AMBULATORY_CARE_PROVIDER_SITE_OTHER): Payer: PPO | Admitting: Family Medicine

## 2022-04-29 VITALS — BP 108/66 | HR 65 | Temp 96.9°F | Ht 72.0 in | Wt 190.4 lb

## 2022-04-29 DIAGNOSIS — I2583 Coronary atherosclerosis due to lipid rich plaque: Secondary | ICD-10-CM | POA: Diagnosis not present

## 2022-04-29 DIAGNOSIS — Z8619 Personal history of other infectious and parasitic diseases: Secondary | ICD-10-CM

## 2022-04-29 DIAGNOSIS — R7309 Other abnormal glucose: Secondary | ICD-10-CM

## 2022-04-29 DIAGNOSIS — I10 Essential (primary) hypertension: Secondary | ICD-10-CM | POA: Diagnosis not present

## 2022-04-29 DIAGNOSIS — N400 Enlarged prostate without lower urinary tract symptoms: Secondary | ICD-10-CM | POA: Diagnosis not present

## 2022-04-29 DIAGNOSIS — I2510 Atherosclerotic heart disease of native coronary artery without angina pectoris: Secondary | ICD-10-CM | POA: Diagnosis not present

## 2022-04-29 NOTE — Progress Notes (Signed)
Established Patient Office Visit   Subjective:  Patient ID: Patrick [name], male    DOB: November 16, 1955  Age: 67 y.o. MRN: [id number]  Chief Complaint  Patient presents with   Follow-up    6 month follow up, no concerns. Not sure if he should have shingles vaccine or not.     HPI Encounter Diagnoses  Name Primary?   Coronary artery disease due to lipid rich plaque Yes   Essential hypertension    Benign prostatic hyperplasia without lower urinary tract symptoms    History of herpes zoster    Elevated glucose    For health check and follow-up of above.  Not currently exercising because of the cold weather.  He is limited in the Silver sneakers and is planning on joining a gym soon.  He has access to dental care.  Continues finasteride and tamsulosin for BPH per urology.  Continues follow-up with cardiology.  He is in a research protocol.  He continues with [place], rosuvastatin and Repatha.   Review of Systems  Constitutional: Negative.   HENT: Negative.    Eyes:  Negative for blurred vision, discharge and redness.  Respiratory: Negative.    Cardiovascular: Negative.   Gastrointestinal:  Negative for abdominal pain.  Genitourinary: Negative.   Musculoskeletal: Negative.  Negative for myalgias.  Skin:  Negative for rash.  Neurological:  Negative for tingling, loss of consciousness and weakness.  Endo/Heme/Allergies:  Negative for polydipsia.     Current Outpatient Medications:    amLODipine (NORVASC) 2.5 MG tablet, Take 1 tablet (2.5 mg total) by mouth daily., Disp: 90 tablet, Rfl: 2   aspirin EC 81 MG tablet, Take 81 mg by mouth daily. Swallow whole., Disp: , Rfl:    ezetimibe (ZETIA) 10 MG tablet, Take 1 tablet (10 mg total) by mouth daily., Disp: 90 tablet, Rfl: 3   finasteride (PROSCAR) 5 MG tablet, TAKE ONE TABLET BY MOUTH ONE TIME DAILY, Disp: 90 tablet, Rfl: 0   nitroGLYCERIN (NITROSTAT) 0.4 MG SL tablet, Place 1 tablet (0.4 mg total) under the tongue every 5 (five)  minutes as needed for chest pain., Disp: 90 tablet, Rfl: 3   REPATHA SURECLICK 409 MG/ML SOAJ, Inject 1 pen into the skin every 14 days, Disp: 6 mL, Rfl: 0   rosuvastatin (CRESTOR) 40 MG tablet, Take 1 tablet by mouth daily in the evening, Disp: 90 tablet, Rfl: 0   tamsulosin (FLOMAX) 0.4 MG CAPS capsule, TAKE ONE CAPSULE BY MOUTH ONE TIME DAILY (Patient taking differently: in the morning and at bedtime.), Disp: 90 capsule, Rfl: 1   gabapentin (NEURONTIN) 400 MG capsule, Take 1 capsule (400 mg total) by mouth 4 (four) times daily. (Patient not taking: Reported on 04/29/2022), Disp: 120 capsule, Rfl: 1   traMADol (ULTRAM) 50 MG tablet, May take 1-2 nightly as needed for pain (Patient not taking: Reported on 12/19/2021), Disp: 30 tablet, Rfl: 0   Objective:     BP 108/66 (BP Location: Right Arm, Patient Position: Sitting, Cuff Size: Normal)   Pulse 65   Temp (!) 96.9 F (36.1 C) (Temporal)   Ht 6' (1.829 m)   Wt 190 lb 6.4 oz (86.4 kg)   SpO2 96%   BMI 25.82 kg/m    Physical Exam Constitutional:      General: He is not in acute distress.    Appearance: Normal appearance. He is not ill-appearing, toxic-appearing or diaphoretic.  HENT:     Head: Normocephalic and atraumatic.     Right Ear:  External ear normal.     Left Ear: External ear normal.     Mouth/Throat:     Mouth: Mucous membranes are moist.     Pharynx: Oropharynx is clear. No oropharyngeal exudate or posterior oropharyngeal erythema.  Eyes:     General: No scleral icterus.       Right eye: No discharge.        Left eye: No discharge.     Extraocular Movements: Extraocular movements intact.     Conjunctiva/sclera: Conjunctivae normal.     Pupils: Pupils are equal, round, and reactive to light.  Cardiovascular:     Rate and Rhythm: Normal rate and regular rhythm.  Pulmonary:     Effort: Pulmonary effort is normal. No respiratory distress.     Breath sounds: Normal breath sounds.  Abdominal:     General: Bowel sounds  are normal.  Musculoskeletal:     Cervical back: No rigidity or tenderness.  Skin:    General: Skin is warm and dry.  Neurological:     Mental Status: He is alert and oriented to person, place, and time.  Psychiatric:        Mood and Affect: Mood normal.        Behavior: Behavior normal.      No results found for any visits on 04/29/22.    The ASCVD Risk score (Arnett DK, et al., 2019) failed to calculate for the following reasons:   The patient has a prior MI or stroke diagnosis    Assessment & Plan:   Coronary artery disease due to lipid rich plaque -     Comprehensive metabolic panel; Future -     Lipid panel; Future  Essential hypertension -     CBC; Future -     Urinalysis, Routine w reflex microscopic  Benign prostatic hyperplasia without lower urinary tract symptoms  History of herpes zoster  Elevated glucose -     Hemoglobin A1c; Future    Return in about 1 year (around 04/30/2023), or Return fasting for above ordered blood work.  Shingrix vaccine through your local pharmacy..  Increase exercise by walking for 30 minutes at least 5 days a week.  Continue follow-up with cardiology.  Continue regular follow-up with urology.  It has been 6 months since his episode zoster.  Advised him to have the Shingrix vaccine.  Libby Maw, MD

## 2022-04-30 ENCOUNTER — Other Ambulatory Visit (INDEPENDENT_AMBULATORY_CARE_PROVIDER_SITE_OTHER): Payer: PPO

## 2022-04-30 DIAGNOSIS — R7309 Other abnormal glucose: Secondary | ICD-10-CM

## 2022-04-30 DIAGNOSIS — I2510 Atherosclerotic heart disease of native coronary artery without angina pectoris: Secondary | ICD-10-CM

## 2022-04-30 DIAGNOSIS — I10 Essential (primary) hypertension: Secondary | ICD-10-CM | POA: Diagnosis not present

## 2022-04-30 DIAGNOSIS — I2583 Coronary atherosclerosis due to lipid rich plaque: Secondary | ICD-10-CM

## 2022-04-30 LAB — COMPREHENSIVE METABOLIC PANEL
ALT: 20 U/L (ref 0–53)
AST: 17 U/L (ref 0–37)
Albumin: 4.4 g/dL (ref 3.5–5.2)
Alkaline Phosphatase: 43 U/L (ref 39–117)
BUN: 15 mg/dL (ref 6–23)
CO2: 27 mEq/L (ref 19–32)
Calcium: 9.1 mg/dL (ref 8.4–10.5)
Chloride: 105 mEq/L (ref 96–112)
Creatinine, Ser: 1.07 mg/dL (ref 0.40–1.50)
GFR: 71.9 mL/min (ref >60.00)
Glucose, Bld: 105 mg/dL — ABNORMAL HIGH (ref 70–99)
Potassium: 4.2 mEq/L (ref 3.5–5.1)
Sodium: 140 mEq/L (ref 135–145)
Total Bilirubin: 0.7 mg/dL (ref 0.2–1.2)
Total Protein: 6.3 g/dL (ref 6.0–8.3)

## 2022-04-30 LAB — CBC
HCT: 40.5 % (ref 39.0–52.0)
Hemoglobin: 13.7 g/dL (ref 13.0–17.0)
MCHC: 33.9 g/dL (ref 30.0–36.0)
MCV: 89.4 fl (ref 78.0–100.0)
Platelets: 160 10*3/uL (ref 150.0–400.0)
RBC: 4.53 Mil/uL (ref 4.22–5.81)
RDW: 12.9 % (ref 11.5–15.5)
WBC: 5.2 10*3/uL (ref 4.0–10.5)

## 2022-04-30 LAB — LIPID PANEL
Cholesterol: 54 mg/dL (ref 0–200)
HDL: 40.5 mg/dL (ref >39.00)
LDL Cholesterol: 0 mg/dL (ref 0–99)
NonHDL: 13.96
Total CHOL/HDL Ratio: 1
Triglycerides: 70 mg/dL (ref 0.0–149.0)
VLDL: 14 mg/dL (ref 0.0–40.0)

## 2022-04-30 LAB — HEMOGLOBIN A1C: Hgb A1c MFr Bld: 6 % (ref 4.6–6.5)

## 2022-05-21 ENCOUNTER — Other Ambulatory Visit: Payer: Self-pay | Admitting: Cardiology

## 2022-05-21 DIAGNOSIS — E785 Hyperlipidemia, unspecified: Secondary | ICD-10-CM

## 2022-05-21 DIAGNOSIS — I2510 Atherosclerotic heart disease of native coronary artery without angina pectoris: Secondary | ICD-10-CM

## 2022-06-08 ENCOUNTER — Other Ambulatory Visit: Payer: Self-pay | Admitting: Family Medicine

## 2022-06-17 ENCOUNTER — Other Ambulatory Visit: Payer: Self-pay | Admitting: Family Medicine

## 2022-06-17 DIAGNOSIS — D225 Melanocytic nevi of trunk: Secondary | ICD-10-CM | POA: Diagnosis not present

## 2022-06-17 DIAGNOSIS — I2510 Atherosclerotic heart disease of native coronary artery without angina pectoris: Secondary | ICD-10-CM

## 2022-06-17 DIAGNOSIS — L570 Actinic keratosis: Secondary | ICD-10-CM | POA: Diagnosis not present

## 2022-06-17 DIAGNOSIS — Z1283 Encounter for screening for malignant neoplasm of skin: Secondary | ICD-10-CM | POA: Diagnosis not present

## 2022-06-17 DIAGNOSIS — I10 Essential (primary) hypertension: Secondary | ICD-10-CM

## 2022-06-17 DIAGNOSIS — X32XXXD Exposure to sunlight, subsequent encounter: Secondary | ICD-10-CM | POA: Diagnosis not present

## 2022-06-23 NOTE — Research (Signed)
Opened in error

## 2022-06-24 ENCOUNTER — Encounter: Payer: PPO | Admitting: *Deleted

## 2022-06-24 VITALS — BP 130/67 | HR 64 | Temp 97.9°F | Wt 188.0 lb

## 2022-06-24 DIAGNOSIS — Z006 Encounter for examination for normal comparison and control in clinical research program: Secondary | ICD-10-CM

## 2022-06-24 MED ORDER — STUDY - OCEAN(A) - OLPASIRAN (AMG 890) 142 MG/ML OR PLACEBO SQ INJECTION (PI-HILTY)
142.0000 mg | PREFILLED_SYRINGE | Freq: Once | SUBCUTANEOUS | Status: AC
  Administered 2022-06-24: 142 mg via SUBCUTANEOUS
  Filled 2022-06-24: qty 1

## 2022-06-24 NOTE — Research (Signed)
[name] week 48  Patient seen in clinic today for week  48 visit of [name] trial. Patient denies any adverse events since last visit. Reviewed medications with patient and no changes noted at this visit. All procedures and  Lab work completed per protocol and patient tolerated well without complaints. IP injection was given at 10:30 in the L arm without any complications noted.  Next visit Week 60 scheduled for 09/23/2022.   PK drawn at 1:11.     [name] IP ADMINISTRATION Subject [id number] IP Box [id number] IP [id number] Time Given:10:30 Administration site: L arm                 Current Outpatient Medications:    amLODipine (NORVASC) 2.5 MG tablet, TAKE ONE TABLET BY MOUTH ONE TIME DAILY, Disp: 90 tablet, Rfl: 3   aspirin EC 81 MG tablet, Take 81 mg by mouth daily. Swallow whole., Disp: , Rfl:    ezetimibe (ZETIA) 10 MG tablet, Take 1 tablet (10 mg total) by mouth daily., Disp: 90 tablet, Rfl: 3   finasteride (PROSCAR) 5 MG tablet, TAKE ONE TABLET BY MOUTH ONE TIME DAILY, Disp: 90 tablet, Rfl: 0   REPATHA SURECLICK [id number] MG/ML SOAJ, INJECT 1 PEN SUBCUTANEOUSLY EVERY 14 DAYS, Disp: 6 mL, Rfl: 0   rosuvastatin (CRESTOR) 40 MG tablet, TAKE ONE TABLET BY MOUTH DAILY IN THE EVENING, Disp: 90 tablet, Rfl: 3   tamsulosin (FLOMAX) 0.4 MG CAPS capsule, TAKE ONE CAPSULE BY MOUTH ONE TIME DAILY (Patient taking differently: in the morning and at bedtime.), Disp: 90 capsule, Rfl: 1   gabapentin (NEURONTIN) 400 MG capsule, Take 1 capsule (400 mg total) by mouth 4 (four) times daily. (Patient not taking: Reported on 04/29/2022), Disp: 120 capsule, Rfl: 1   nitroGLYCERIN (NITROSTAT) 0.4 MG SL tablet, Place 1 tablet (0.4 mg total) under the tongue every 5 (five) minutes as needed for chest pain., Disp: 90 tablet, Rfl: 3   traMADol (ULTRAM) 50 MG tablet, May take 1-2 nightly as needed for pain (Patient not taking: Reported on 12/19/2021), Disp: 30 tablet, Rfl: 0  Current Facility-Administered  Medications:    Study - [name] - olpasiran (AMG 890) 142 mg/mL or placebo SQ injection (PI-Hilty), 142 mg, Subcutaneous, Once, Hilty, Nadean Corwin, MD

## 2022-06-24 NOTE — Progress Notes (Addendum)
Patrick [name] is seen in continuing follow up for the [facility] a trial.  He seems to be doing well.  He has prior 2 vessel stenting with elevated LDL and Lp(a) on investigational IP.  Most recent LDL was 0 on multidrug treatment.  No chest pain.  Feels good.    Alert, oriented in No acute distress BP   130/67           P   64        R    16         T 97.9      No JVD No carotid bruits Lungs clear PMI normal.  Normal S1 and split S2.  No murmurs rubs or gallops Abdomen soft without HS megaly.    ECG -  NSR.  WNL.  Compared to prior tracing 11/13/2020, no significant change.   LDL  0  Plan  Continue involvement in clinical trial  Consider revamping cholesterol lowering agents...Marland KitchenMarland KitchenMarland Kitchenwould consider continuing Repatha given elevated Lp(a)   Loretha Brasil. Lia Foyer, MD Medical Director, [facility]

## 2022-07-02 NOTE — Research (Addendum)
[name] week 48 lab results:             Are there any labs that are clinically significant?  Yes [] OR No[x]  Kenneth C. Hilty, MD, FACC, FACP  [place]  [facility]  Medical Director of the [facility] Lipid [facility]  Cardiovascular Risk Reduction Clinic Diplomate of the American Board of Clinical Lipidology Attending Cardiologist  Direct Dial: [phone]  Fax: [phone]  Website:  [email or web address]  

## 2022-07-03 ENCOUNTER — Other Ambulatory Visit: Payer: Self-pay | Admitting: Family Medicine

## 2022-07-03 DIAGNOSIS — N400 Enlarged prostate without lower urinary tract symptoms: Secondary | ICD-10-CM

## 2022-07-15 ENCOUNTER — Encounter: Payer: Self-pay | Admitting: Cardiology

## 2022-07-16 MED ORDER — ROSUVASTATIN CALCIUM 10 MG PO TABS: 10.0000 mg | ORAL_TABLET | Freq: Every evening | ORAL | 3 refills | Status: DC

## 2022-09-03 ENCOUNTER — Other Ambulatory Visit: Payer: Self-pay | Admitting: Cardiology

## 2022-09-03 DIAGNOSIS — E785 Hyperlipidemia, unspecified: Secondary | ICD-10-CM

## 2022-09-03 DIAGNOSIS — I2510 Atherosclerotic heart disease of native coronary artery without angina pectoris: Secondary | ICD-10-CM

## 2022-09-08 ENCOUNTER — Telehealth: Payer: Self-pay

## 2022-09-08 NOTE — Telephone Encounter (Signed)
-----   Message from Chrystie Nose, MD sent at 07/24/2022  5:19 PM EDT ----- Regarding: RE: AE? AE - but unrelated to the study drug   [place]  ----- Message ----- From: Myra Rude, LPN Sent: 07/14/8117   4:37 PM EDT To: Chrystie Nose, MD Subject: AE?                                            Hi Dr.Hilty,  I received a query for this pt, he started taking gabapentin last July for Shingles. The medical reviewer wants to know if you consider this an AE?  Thank you!!!  Raynelle Fanning

## 2022-09-23 VITALS — BP 112/63 | HR 54 | Temp 98.2°F | Resp 16

## 2022-09-23 DIAGNOSIS — Z006 Encounter for examination for normal comparison and control in clinical research program: Secondary | ICD-10-CM

## 2022-09-23 MED ORDER — STUDY - OCEAN(A) - OLPASIRAN (AMG 890) 142 MG/ML OR PLACEBO SQ INJECTION (PI-HILTY)
142.0000 mg | PREFILLED_SYRINGE | Freq: Once | SUBCUTANEOUS | Status: AC
  Administered 2022-09-23: 142 mg via SUBCUTANEOUS
  Filled 2022-09-23: qty 1

## 2022-09-23 NOTE — Research (Signed)
[name] week 60  Patient seen in clinic today for week  60 visit of [name] trial. Patient denies any adverse events since last visit. Reviewed medications with patient and no changes noted at this visit. All procedures completed per protocol and patient tolerated well without complaints. IP injection was given at 10:13 in the L arm without any complications noted.  Next visit Week 72 scheduled for 12/16/2022. Reviewed lipid blinding with the patient and he/she understands.    [name] IP ADMINISTRATION Subject WU#[id number] IP Box [id number] IP FAO#:[id number] Time Given:10:13 Administration site:L arm   Current Outpatient Medications:    amLODipine (NORVASC) 2.5 MG tablet, TAKE ONE TABLET BY MOUTH ONE TIME DAILY, Disp: 90 tablet, Rfl: 3   aspirin EC 81 MG tablet, Take 81 mg by mouth daily. Swallow whole., Disp: , Rfl:    Evolocumab (REPATHA SURECLICK) 140 MG/ML SOAJ, INJECT (140MG ) UNDER THE SKIN EVERY 14 DAYS, Disp: 6 mL, Rfl: 3   ezetimibe (ZETIA) 10 MG tablet, Take 1 tablet (10 mg total) by mouth daily., Disp: 90 tablet, Rfl: 3   finasteride (PROSCAR) 5 MG tablet, TAKE ONE TABLET BY MOUTH ONE TIME DAILY, Disp: 90 tablet, Rfl: 3   rosuvastatin (CRESTOR) 10 MG tablet, Take 1 tablet (10 mg total) by mouth every evening., Disp: 30 tablet, Rfl: 3   tamsulosin (FLOMAX) 0.4 MG CAPS capsule, TAKE ONE CAPSULE BY MOUTH ONE TIME DAILY (Patient taking differently: in the morning and at bedtime.), Disp: 90 capsule, Rfl: 1   gabapentin (NEURONTIN) 400 MG capsule, Take 1 capsule (400 mg total) by mouth 4 (four) times daily. (Patient not taking: Reported on 04/29/2022), Disp: 120 capsule, Rfl: 1   nitroGLYCERIN (NITROSTAT) 0.4 MG SL tablet, Place 1 tablet (0.4 mg total) under the tongue every 5 (five) minutes as needed for chest pain., Disp: 90 tablet, Rfl: 3   traMADol (ULTRAM) 50 MG tablet, May take 1-2 nightly as needed for pain (Patient not taking: Reported on 12/19/2021), Disp: 30 tablet, Rfl:  0  Current Facility-Administered Medications:    Study - [name] - olpasiran (AMG 890) 142 mg/mL or placebo SQ injection (PI-Hilty), 142 mg, Subcutaneous, Once, Hilty, Lisette Abu, MD

## 2022-09-29 ENCOUNTER — Other Ambulatory Visit: Payer: Self-pay | Admitting: Family Medicine

## 2022-09-29 DIAGNOSIS — I2510 Atherosclerotic heart disease of native coronary artery without angina pectoris: Secondary | ICD-10-CM

## 2022-11-07 ENCOUNTER — Other Ambulatory Visit: Payer: Self-pay | Admitting: Cardiology

## 2022-12-16 ENCOUNTER — Encounter: Payer: PPO | Admitting: *Deleted

## 2022-12-16 DIAGNOSIS — Z006 Encounter for examination for normal comparison and control in clinical research program: Secondary | ICD-10-CM

## 2022-12-16 MED ORDER — STUDY - OCEAN(A) - OLPASIRAN (AMG 890) 142 MG/ML OR PLACEBO SQ INJECTION (PI-HILTY)
142.0000 mg | PREFILLED_SYRINGE | Freq: Once | SUBCUTANEOUS | Status: AC
  Administered 2022-12-16: 142 mg via SUBCUTANEOUS
  Filled 2022-12-16: qty 1

## 2022-12-16 NOTE — Research (Signed)
[name]  Week 72   Vitals: [x]  Experience any AE/SAE/Hospitalizations [x]   If yes please explain:  Labs drawn at 0930am  NO LABS AT NEXT 66 WEEK VISIT  Spoke with patient about lipid blinding and the importance.  Non-Fatal Potential Endpoint Assessment Yes  No   Has the subject experienced/undergone any of the following since the last visit/contact?   []   [x]    Any Coronary Artery Revascularization/Cerebrovascular Revascularization/ Peripheral Artery Revascularization/Amputation Procedure   []   [x]    Myocardial Infarction []   [x]    Stroke   []   [x]    Provide the date for the non-fatal Potential Endpoints status:   []   [x]    IP admin please see MAR (please add Box # to comment section on MAR) [x]    Current Outpatient Medications:    amLODipine (NORVASC) 2.5 MG tablet, TAKE ONE TABLET BY MOUTH ONE TIME DAILY, Disp: 90 tablet, Rfl: 3   aspirin EC 81 MG tablet, Take 81 mg by mouth daily. Swallow whole., Disp: , Rfl:    Evolocumab (REPATHA SURECLICK) 140 MG/ML SOAJ, INJECT (140MG ) UNDER THE SKIN EVERY 14 DAYS, Disp: 6 mL, Rfl: 3   ezetimibe (ZETIA) 10 MG tablet, TAKE ONE TABLET BY MOUTH ONE TIME DAILY, Disp: 90 tablet, Rfl: 0   finasteride (PROSCAR) 5 MG tablet, TAKE ONE TABLET BY MOUTH ONE TIME DAILY, Disp: 90 tablet, Rfl: 3   nitroGLYCERIN (NITROSTAT) 0.4 MG SL tablet, Place 1 tablet (0.4 mg total) under the tongue every 5 (five) minutes as needed for chest pain., Disp: 90 tablet, Rfl: 3   rosuvastatin (CRESTOR) 10 MG tablet, TAKE ONE TABLET BY MOUTH ONE TIME DAILY IN THE EVENING, Disp: 30 tablet, Rfl: 2   tamsulosin (FLOMAX) 0.4 MG CAPS capsule, TAKE ONE CAPSULE BY MOUTH ONE TIME DAILY (Patient taking differently: in the morning and at bedtime.), Disp: 90 capsule, Rfl: 1   gabapentin (NEURONTIN) 400 MG capsule, Take 1 capsule (400 mg total) by mouth 4 (four) times daily. (Patient not taking: Reported on 04/29/2022), Disp: 120 capsule, Rfl: 1   traMADol (ULTRAM) 50 MG tablet, May  take 1-2 nightly as needed for pain (Patient not taking: Reported on 12/19/2021), Disp: 30 tablet, Rfl: 0

## 2022-12-18 NOTE — Research (Addendum)
Are there any labs that are clinically significant?  Yes []  OR No[x]   Please FORWARD back to me with any changes or follow up!   Chrystie Nose, MD, [facility], FACP  [facility]  Medical Director of the [facility] Lipid [facility] &  Cardiovascular Risk Reduction Clinic Diplomate of the American Board of Clinical Lipidology Attending Cardiologist  Direct Dial: [phone]  Fax: [phone]  Website:  [email or web address]    ACCESSION NO. [id number]                                             Page 1 of 1                                                        INVESTIGATOR: ([id number])                          PROTOCOL   [id number]                     Zoila Shutter M.D.                             INVESTIGATOR NO.: [id number]                     c/o Claretta Fraise                              SUBJECT ID: [id number]                     [facility]                 SUBJECT INITIALS NOT COLLECTED:                     [address] [place] [address]                 VISIT: Janae [place], [place] [id number]                   SPONSOR REPORT TO:                 COLLECTION TIME:09:30 DATE:16-Dec-2022                     Mertha Baars                      DATE RECEIVED IN LABORATORY: 17-Dec-2022                     c/o Sponsor(or Addtl) [facility].Study DATE REPORTED BY LABORATORY: 17-Dec-2022                     [facility]                          SEX:  M  BIRTHDATE:  06-Oct-1955    AGE: 15X                     [address]                  Wallis Bamberg NO.: [address] [place] [address]                                                                                        Ref. Ranges               Clinical    Comments                                                                          Significance                                                                            Yes*  No                    SM [id number] ANTIBODY  COLL D/T                      CDate PreD     16-Dec-2022                                               CTime PreD     09:30                                         EGFR                      CKDEPI eGF     91           mL/min/1.41m2                                                            No Ref Rng           ALT > 3 X ULN  ALT>3XULN      Criteria not met                                        ALT > 5 X ULN                      ALT>5XULN      Criteria not met                                        ALT > 8 X ULN                      ALT>8XULN      Criteria not met                                        ALT & TBIL                      ALT & TBIL     Criteria not met                                        AST > 3 X ULN                      AST>3XULN      Criteria not met                                        AST > 5 X ULN                      AST>5XULN      Criteria not met                                        AST > 8 X ULN                      AST>8XULN      Criteria not met                                        AST & TBIL                      AST & TBIL     Criteria not met                             CHEMISTRY PANEL                      Total Bili     0.3          0.2-1.2 mg/dL  Dir Bili       0.1          0.0-0.4 mg/dL                                Ind Bili       0.2          0.0-1.2 mg/dL                                Alk Phos       53           40-129 U/L                                   ALT (SGPT)     15           5-48 U/L                                    AST (SGOT)     16           8-40 U/L                                    Urea Nitr      17           4-24 mg/dL                                   Creatinine     0.92         0.45-1.35 mg/dL                              Calcium        9.2          8.3-10.6 mg/dL                              Total Prot     6.1          6.0-8.0 g/dL                                  Alb BCG        4.3          3.3-4.9 g/dL                                 CK             80           39-308 U/L                                   Sodium         141          135-145 mEq/L  Potassium      4.3          3.5-5.2 mEq/L                                Bicarb         24.5         19.3-29.3 mEq/L                              Chloride       105          94-112 mEq/L                               ADJUSTED CALCIUM                      Adj Calc       9.2          8.3-10.6 mg/dL          HEMATOLOGY&DIFFERENTIAL PANEL                      HGB            13.8         12.5-17.0 g/dL                              HCT            41           37-51 %                                      RBC            4.4          4.0-5.8 x106/uL                              MCH            31           26-34 pg                                    MCHC           34           31-38 g/dL                                   RDW            13.9         12.0-15.0 %                                 RBC Morph      No Review Required  MCV            93           80-100 fL                                    WBC            4.18         3.80-10.70 x103/uL                           Neutrophil     2.45         1.96-7.23 x103/uL                           Lymphocyte     1.32         0.80-3.00 x103/uL                           Monocytes      0.31         0.12-0.92 x103/uL                           Eosinophil     0.07         0.00-0.57 x103/uL                           Basophils      0.02         0.00-0.20 x103/uL                           Neutrophil     58.6         40.5-75.0 %                                 Lymphocyte     31.8         15.4-48.5%                                   Monocytes      7.5          2.6-10.1 %                                   Eosinophil     1.7          0.0-6.8 %                                    Basophils      0.5          0.0-2.0 %                                     Platelets      173          130-394 x103/uL  ANC                      ANC            2.45         1.96-7.23 x103/uL                   ANION GAP                      Anion Gap      16           7-18 mEq/L      COAGULATION GROUP                      APTT           23.7         21.9-29.4 sec                                PT             10.1         9.7-12.3 sec                                 INR            0.9          Patient not taking                                                       oral anticoagulant:                                                      0.8 - 1.2                                                                Patient taking                                                          oral anticoagulant:                                                      2.0 - 3.0                                  ALT & INR  ALT & INR      Criteria not met                                        AST & INR                      AST & INR      Criteria not met

## 2023-01-08 NOTE — Research (Signed)
  Updated per EPIC review   Non-Fatal Potential Endpoint Assessment Yes  No   Has the subject experienced/undergone any of the following since the last visit/contact?   []   [x]    Any Coronary Artery Revascularization/Cerebrovascular Revascularization/ Peripheral Artery Revascularization/Amputation Procedure   []   [x]    Myocardial Infarction []   [x]    Stroke   []   [x]    Provide the date for the non-fatal Potential Endpoints status:   []   [x]

## 2023-01-09 NOTE — Research (Signed)
Lot number [id number]

## 2023-01-28 ENCOUNTER — Other Ambulatory Visit: Payer: Self-pay | Admitting: Family Medicine

## 2023-01-28 DIAGNOSIS — I2510 Atherosclerotic heart disease of native coronary artery without angina pectoris: Secondary | ICD-10-CM

## 2023-01-31 ENCOUNTER — Other Ambulatory Visit: Payer: Self-pay | Admitting: Cardiology

## 2023-02-05 DIAGNOSIS — R350 Frequency of micturition: Secondary | ICD-10-CM | POA: Diagnosis not present

## 2023-02-05 DIAGNOSIS — N200 Calculus of kidney: Secondary | ICD-10-CM | POA: Diagnosis not present

## 2023-02-05 DIAGNOSIS — N401 Enlarged prostate with lower urinary tract symptoms: Secondary | ICD-10-CM | POA: Diagnosis not present

## 2023-02-18 DIAGNOSIS — Z006 Encounter for examination for normal comparison and control in clinical research program: Secondary | ICD-10-CM

## 2023-02-18 MED ORDER — STUDY - OCEAN(A) - OLPASIRAN (AMG 890) 142 MG/ML OR PLACEBO SQ INJECTION (PI-HILTY)
142.0000 mg | PREFILLED_SYRINGE | Freq: Once | SUBCUTANEOUS | Status: AC
  Administered 2023-02-18: 142 mg via SUBCUTANEOUS
  Filled 2023-02-18: qty 1

## 2023-02-18 NOTE — Research (Addendum)
[name]  Week 84  Vitals: [x]  Experience any AE/SAE/Hospitalizations []  Yes [x]  No  If yes please explain:  Labs collected:  no labs  Discussed with patient about the importance of not letting any one draw cholesterol or lipids. As to we are blinded to results. Reassured patient if we needed to be contacted the study team would reach out to our unblinded person.   ~reminder fasting labs, EKG, MD to see next visit ~  Non-Fatal Potential Endpoint Assessment Yes  No   Has the subject experienced/undergone any of the following since the last visit/contact?   []   [x]    Any Coronary Artery Revascularization/Cerebrovascular Revascularization/ Peripheral Artery Revascularization/Amputation Procedure   []   [x]    Myocardial Infarction []   [x]    Stroke   []   [x]    Provide the date for the non-fatal Potential Endpoints status:   []   [x]     IP admin please see MAR (please add Box # to comment section on MAR) [x]  Stop Plavix on 03/07/2022 per MD ok to stop  Gabapentin he is not taking anymore stopped he took the gabapentin due to shingles  Took the shingles vaccine last week  Rosuvastatin decreased to 10 July 16, 2022 per cardiologist  IP given [id number]    Lot: [id number]  Current Outpatient Medications:    amLODipine (NORVASC) 2.5 MG tablet, TAKE ONE TABLET BY MOUTH ONE TIME DAILY, Disp: 90 tablet, Rfl: 3   aspirin EC 81 MG tablet, Take 81 mg by mouth daily. Swallow whole., Disp: , Rfl:    Evolocumab (REPATHA SURECLICK) 140 MG/ML SOAJ, INJECT (140MG ) UNDER THE SKIN EVERY 14 DAYS, Disp: 6 mL, Rfl: 3   ezetimibe (ZETIA) 10 MG tablet, TAKE ONE TABLET BY MOUTH ONCE A DAY, Disp: 90 tablet, Rfl: 0   finasteride (PROSCAR) 5 MG tablet, TAKE ONE TABLET BY MOUTH ONE TIME DAILY, Disp: 90 tablet, Rfl: 3   rosuvastatin (CRESTOR) 10 MG tablet, Take 1 tablet (10 mg total) by mouth every evening. 2nd attempt, patient needs and appt for additional refills, Disp: 30 tablet, Rfl: 0   tamsulosin (FLOMAX)  0.4 MG CAPS capsule, TAKE ONE CAPSULE BY MOUTH ONE TIME DAILY (Patient taking differently: Take 0.4 mg by mouth in the morning and at bedtime.), Disp: 90 capsule, Rfl: 1   gabapentin (NEURONTIN) 400 MG capsule, Take 1 capsule (400 mg total) by mouth 4 (four) times daily. (Patient not taking: Reported on 02/18/2023), Disp: 120 capsule, Rfl: 1   nitroGLYCERIN (NITROSTAT) 0.4 MG SL tablet, Place 1 tablet (0.4 mg total) under the tongue every 5 (five) minutes as needed for chest pain., Disp: 90 tablet, Rfl: 3   traMADol (ULTRAM) 50 MG tablet, May take 1-2 nightly as needed for pain (Patient not taking: Reported on 12/19/2021), Disp: 30 tablet, Rfl: 0  Current Facility-Administered Medications:    Study - [name] - olpasiran (AMG 890) 142 mg/mL or placebo SQ injection (PI-Hilty), 142 mg, Subcutaneous, Once,

## 2023-03-01 ENCOUNTER — Other Ambulatory Visit: Payer: Self-pay | Admitting: Cardiology

## 2023-03-11 NOTE — Research (Signed)
Used left arm for blood pressure reading.

## 2023-03-20 ENCOUNTER — Ambulatory Visit (INDEPENDENT_AMBULATORY_CARE_PROVIDER_SITE_OTHER): Payer: PPO

## 2023-03-20 ENCOUNTER — Encounter: Payer: Self-pay | Admitting: Cardiology

## 2023-03-20 ENCOUNTER — Ambulatory Visit: Payer: PPO | Attending: Cardiology | Admitting: Cardiology

## 2023-03-20 VITALS — BP 130/78 | HR 55 | Ht 72.0 in | Wt 189.2 lb

## 2023-03-20 DIAGNOSIS — I2510 Atherosclerotic heart disease of native coronary artery without angina pectoris: Secondary | ICD-10-CM

## 2023-03-20 DIAGNOSIS — I10 Essential (primary) hypertension: Secondary | ICD-10-CM

## 2023-03-20 DIAGNOSIS — R55 Syncope and collapse: Secondary | ICD-10-CM | POA: Diagnosis not present

## 2023-03-20 DIAGNOSIS — Z79899 Other long term (current) drug therapy: Secondary | ICD-10-CM

## 2023-03-20 DIAGNOSIS — R42 Dizziness and giddiness: Secondary | ICD-10-CM

## 2023-03-20 DIAGNOSIS — E782 Mixed hyperlipidemia: Secondary | ICD-10-CM | POA: Diagnosis not present

## 2023-03-20 DIAGNOSIS — R001 Bradycardia, unspecified: Secondary | ICD-10-CM | POA: Diagnosis not present

## 2023-03-20 NOTE — Patient Instructions (Signed)
Medication Instructions:  Your physician recommends that you continue on your current medications as directed. Please refer to the Current Medication list given to you today.  *If you need a refill on your cardiac medications before your next appointment, please call your pharmacy*   Lab Work: CMET, Mag If you have labs (blood work) drawn today and your tests are completely normal, you will receive your results only by: MyChart Message (if you have MyChart) OR A paper copy in the mail If you have any lab test that is abnormal or we need to change your treatment, we will call you to review the results.   Testing/Procedures: Your physician has requested that you have a carotid duplex. This test is an ultrasound of the carotid arteries in your neck. It looks at blood flow through these arteries that supply the brain with blood. Allow one hour for this exam. There are no restrictions or special instructions.  ZIO XT- Long Term Monitor Instructions  Your physician has requested you wear a ZIO patch monitor for 14 days.  This is a single patch monitor. [employer] supplies one patch monitor per enrollment. Additional stickers are not available. Please do not apply patch if you will be having a Nuclear Stress Test,  Echocardiogram, Cardiac CT, MRI, or Chest Xray during the period you would be wearing the  monitor. The patch cannot be worn during these tests. You cannot remove and re-apply the  ZIO XT patch monitor.  Your ZIO patch monitor will be mailed 3 day USPS to your address on file. It may take 3-5 days  to receive your monitor after you have been enrolled.  Once you have received your monitor, please review the enclosed instructions. Your monitor  has already been registered assigning a specific monitor serial # to you.  Billing and Patient Assistance Program Information  We have supplied [employer] with any of your insurance information on file for billing purposes. [employer] offers a sliding  scale Patient Assistance Program for patients that do not have  insurance, or whose insurance does not completely cover the cost of the ZIO monitor.  You must apply for the Patient Assistance Program to qualify for this discounted rate.  To apply, please call [employer] at [phone], select option 4, select option 2, ask to apply for  Patient Assistance Program. Meredeth Ide will ask your household income, and how many people  are in your household. They will quote your out-of-pocket cost based on that information.  [employer] will also be able to set up a 49-month, interest-free payment plan if needed.  Applying the monitor   Shave hair from upper left chest.  Hold abrader disc by orange tab. Rub abrader in 40 strokes over the upper left chest as  indicated in your monitor instructions.  Clean area with 4 enclosed alcohol pads. Let dry.  Apply patch as indicated in monitor instructions. Patch will be placed under collarbone on left  side of chest with arrow pointing upward.  Rub patch adhesive wings for 2 minutes. Remove white label marked "1". Remove the white  label marked "2". Rub patch adhesive wings for 2 additional minutes.  While looking in a mirror, press and release button in center of patch. A small green light will  flash 3-4 times. This will be your only indicator that the monitor has been turned on.  Do not shower for the first 24 hours. You may shower after the first 24 hours.  Press the button if you feel a symptom.  You will hear a small click. Record Date, Time and  Symptom in the Patient Logbook.  When you are ready to remove the patch, follow instructions on the last 2 pages of Patient  Logbook. Stick patch monitor onto the last page of Patient Logbook.  Place Patient Logbook in the blue and white box. Use locking tab on box and tape box closed  securely. The blue and white box has prepaid postage on it. Please place it in the mailbox as  soon as possible. Your physician should  have your test results approximately 7 days after the  monitor has been mailed back to [facility].  Call [facility] Customer Care at [phone] if you have questions regarding  your ZIO XT patch monitor. Call them immediately if you see an orange light blinking on your  monitor.  If your monitor falls off in less than 4 days, contact our Monitor department at [phone].  If your monitor becomes loose or falls off after 4 days call [employer] at [phone] for  suggestions on securing your monitor    Follow-Up: At [facility], you and your health needs are our priority.  As part of our continuing mission to provide you with exceptional heart care, we have created designated Provider Care Teams.  These Care Teams include your primary Cardiologist (physician) and Advanced Practice Providers (APPs -  Physician Assistants and Nurse Practitioners) who all work together to provide you with the care you need, when you need it.   Your next appointment:   4 month(s)  Provider:   Thomasene Ripple, DO     Other Instructions [email or web address] [email or web address]        FDA-cleared, clinical grade mobile EKG monitor: Lourena Simmonds is the most clinically-validated mobile EKG used by the world's leading cardiac care medical professionals With Basic service, know instantly if your heart rhythm is normal or if atrial fibrillation is detected, and email the last single EKG recording to yourself or your doctor Premium service, available for purchase through the K[employer]ia app for $9.99 per month or $99 per year, includes unlimited history and storage of your EKG recordings, a monthly EKG summary report to share with your doctor, along with the ability to track your blood pressure, activity and weight Includes one K[employer]iaMobile phone clip FREE SHIPPING: Standard delivery 1-3 business days. Orders placed by 11:00am PST will ship that afternoon. Otherwise, will ship next  business day. All orders ship via [employer] from [place], [place]    [employer] - sending an EKG Download app and set up profile. Run EKG - by placing 1-2 fingers on the silver plates After EKG is complete - Download PDF  - Skip password (if you apply a password the provider will need it to view the EKG) Click share button (square with upward arrow) in bottom left corner To send: choose MyChart (first time log into MyChart)  Pop up window about sending ECG Click continue Choose type of message Choose provider Type subject and message Click send (EKG should be attached)  - To send additional EKGs in one message click the paperclip image and bottom of page to attach.

## 2023-03-20 NOTE — Progress Notes (Unsigned)
Cardiology Office Note:    Date:  03/22/2023   ID:  Patrick [name], DOB 06/20/1955, MRN [id number]  PCP:  Mliss Sax, MD  Cardiologist:  Thomasene Ripple, DO  Electrophysiologist:  None   Referring MD: Mliss Sax,*     History of Present Illness:    Patrick [name] is a 67 y.o. male with a hx of coronary artery disease status post PCI to the RCA and circumflex with atherectomy in the RCA as well on dual antiplatelet therapy with aspirin and Plavix, hyperlipidemia now on PCSK9 inhibitors, history of TIA is here today for follow-up visit.   He is currently participating in a research study - lipid lowering agent.   He reports that  occasional dizziness, comparing the sensation to a previous episode of passing out due to oxygen lack at a high altitude. The most recent episode occurred a month ago during a hike.   Past Medical History:  Diagnosis Date   Coronary artery disease    moderate   Hyperlipidemia    TIA (transient ischemic attack)     Past Surgical History:  Procedure Laterality Date   CORONARY ATHERECTOMY N/A 11/13/2020   Procedure: CORONARY ATHERECTOMY;  Surgeon: Tonny Bollman, MD;  Location: [facility] INVASIVE CV LAB;  Service: Cardiovascular;  Laterality: N/A;   CORONARY PRESSURE/FFR STUDY N/A 11/02/2020   Procedure: INTRAVASCULAR PRESSURE WIRE/FFR STUDY;  Surgeon: Tonny Bollman, MD;  Location: [facility] INVASIVE CV LAB;  Service: Cardiovascular;  Laterality: N/A;   LEFT HEART CATH AND CORONARY ANGIOGRAPHY N/A 11/02/2020   Procedure: LEFT HEART CATH AND CORONARY ANGIOGRAPHY;  Surgeon: Tonny Bollman, MD;  Location: [facility] INVASIVE CV LAB;  Service: Cardiovascular;  Laterality: N/A;   MANDIBULAR HARDWARE REMOVAL N/A 02/21/2016   Procedure: MANDIBULAR HARDWARE REMOVAL;  Surgeon: Peggye Form, DO;  Location: [facility];  Service: Plastics;  Laterality: N/A;   ORIF MANDIBULAR FRACTURE N/A 01/07/2016   Procedure: OPEN REDUCTION INTERNAL  FIXATION (ORIF) MANDIBULAR FRACTURE;  Surgeon: Alena Bills Dillingham, DO;  Location: [facility] OR;  Service: Plastics;  Laterality: N/A;   PLACEMENT OF LUMBAR DRAIN N/A 01/14/2016   Procedure: PLACEMENT OF LUMBAR DRAIN;  Surgeon: Tressie Stalker, MD;  Location[facility];  Service: Neurosurgery;  Laterality: N/A;    Current Medications: Current Meds  Medication Sig   Acetaminophen (TYLENOL ARTHRITIS PAIN PO) Take by mouth as needed.   amLODipine (NORVASC) 2.5 MG tablet TAKE ONE TABLET BY MOUTH ONE TIME DAILY   aspirin EC 81 MG tablet Take 81 mg by mouth daily. Swallow whole.   Evolocumab (REPATHA SURECLICK) 140 MG/ML SOAJ INJECT (140MG ) UNDER THE SKIN EVERY 14 DAYS   ezetimibe (ZETIA) 10 MG tablet TAKE ONE TABLET BY MOUTH ONCE A DAY   finasteride (PROSCAR) 5 MG tablet TAKE ONE TABLET BY MOUTH ONE TIME DAILY   gabapentin (NEURONTIN) 400 MG capsule Take 1 capsule (400 mg total) by mouth 4 (four) times daily.   rosuvastatin (CRESTOR) 10 MG tablet TAKE ONE TABLET BY MOUTH DAILY IN THE EVENING   tamsulosin (FLOMAX) 0.4 MG CAPS capsule TAKE ONE CAPSULE BY MOUTH ONE TIME DAILY (Patient taking differently: Take 0.4 mg by mouth in the morning and at bedtime.)   traMADol (ULTRAM) 50 MG tablet May take 1-2 nightly as needed for pain     Allergies:   Patient has no known allergies.   Social History   Socioeconomic History   Marital status: Married    Spouse name: Not on file  Number of children: Not on file   Years of education: Not on file   Highest education level: Not on file  Occupational History   Not on file  Tobacco Use   Smoking status: Former    Current packs/day: 0.00    Types: Cigarettes    Quit date: 01/06/1995    Years since quitting: 28.2   Smokeless tobacco: Never  Vaping Use   Vaping status: Never Used  Substance and Sexual Activity   Alcohol use: Yes    Comment: occ   Drug use: No   Sexual activity: Not on file  Other Topics Concern   Not on file  Social History Narrative    Not on file   Social Drivers of Health   Financial Resource Strain: Low Risk  (03/17/2022)   Overall Financial Resource Strain (CARDIA)    Difficulty of Paying Living Expenses: Not hard at all  Food Insecurity: No Food Insecurity (03/17/2022)   Hunger Vital Sign    Worried About Running Out of Food in the Last Year: Never true    Ran Out of Food in the Last Year: Never true  Transportation Needs: No Transportation Needs (03/17/2022)   PRAPARE - Administrator, Civil Service (Medical): No    Lack of Transportation (Non-Medical): No  Physical Activity: Sufficiently Active (03/17/2022)   Exercise Vital Sign    Days of Exercise per Week: 3 days    Minutes of Exercise per Session: 60 min  Stress: No Stress Concern Present (03/17/2022)   [employer] of Occupational Health - Occupational Stress Questionnaire    Feeling of Stress : Only a little  Social Connections: Unknown (12/18/2022)   Received from [facility]   Social Network    Social Network: Not on file     Family History: The patient's family history includes Cancer in his mother; Heart disease in his father.  ROS:   Review of Systems  Constitution: Negative for decreased appetite, fever and weight gain.  HENT: Negative for congestion, ear discharge, hoarse voice and sore throat.   Eyes: Negative for discharge, redness, vision loss in right eye and visual halos.  Cardiovascular: Negative for chest pain, dyspnea on exertion, leg swelling, orthopnea and palpitations.  Respiratory: Negative for cough, hemoptysis, shortness of breath and snoring.   Endocrine: Negative for heat intolerance and polyphagia.  Hematologic/Lymphatic: Negative for bleeding problem. Does not bruise/bleed easily.  Skin: Negative for flushing, nail changes, rash and suspicious lesions.  Musculoskeletal: Negative for arthritis, joint pain, muscle cramps, myalgias, neck pain and stiffness.  Gastrointestinal: Negative for abdominal  pain, bowel incontinence, diarrhea and excessive appetite.  Genitourinary: Negative for decreased libido, genital sores and incomplete emptying.  Neurological: Negative for brief paralysis, focal weakness, headaches and loss of balance.  Psychiatric/Behavioral: Negative for altered mental status, depression and suicidal ideas.  Allergic/Immunologic: Negative for HIV exposure and persistent infections.    EKGs/Labs/Other Studies Reviewed:    The following studies were reviewed today:   EKG:  The ekg ordered today demonstrates sinus rhythm, heart rate 71 bpm.  Transthoracic echocardiogram performed on October 31, 2020 IMPRESSIONS     1. Left ventricular ejection fraction, by estimation, is 50 to 55%. The  left ventricle has low normal function. The left ventricle has no regional  wall motion abnormalities. There is mild asymmetric left ventricular  hypertrophy. Left ventricular  diastolic parameters were normal.   2. Right ventricular systolic function is normal. The right ventricular  size is normal. Tricuspid  regurgitation signal is inadequate for assessing  PA pressure.   3. The mitral valve is grossly normal. No evidence of mitral valve  regurgitation. No evidence of mitral stenosis.   4. The aortic valve is tricuspid. There is mild calcification of the  aortic valve. There is mild thickening of the aortic valve. Aortic valve  regurgitation is not visualized. No aortic stenosis is present.   5. The inferior vena cava is normal in size with greater than 50%  respiratory variability, suggesting right atrial pressure of 3 mmHg.   Comparison(s): A prior study was performed on 07/20/2006. Prior images  reviewed side by side. LV is slightly less vigorous from prior.   FINDINGS   Left Ventricle: Left ventricular ejection fraction, by estimation, is 50  to 55%. The left ventricle has low normal function. The left ventricle has  no regional wall motion abnormalities. The left ventricular  internal  cavity size was normal in size.  There is mild asymmetric left ventricular hypertrophy. Left ventricular  diastolic parameters were normal.   Right Ventricle: The right ventricular size is normal. No increase in  right ventricular wall thickness. Right ventricular systolic function is  normal. Tricuspid regurgitation signal is inadequate for assessing PA  pressure.   Left Atrium: Left atrial size was normal in size.   Right Atrium: Right atrial size was normal in size.   Pericardium: There is no evidence of pericardial effusion.   Mitral Valve: The mitral valve is grossly normal. No evidence of mitral  valve regurgitation. No evidence of mitral valve stenosis.   Tricuspid Valve: The tricuspid valve is normal in structure. Tricuspid  valve regurgitation is trivial. No evidence of tricuspid stenosis.   Aortic Valve: The aortic valve is tricuspid. There is mild calcification  of the aortic valve. There is mild thickening of the aortic valve. There  is mild aortic valve annular calcification. Aortic valve regurgitation is  not visualized. No aortic stenosis   is present.   Pulmonic Valve: The pulmonic valve was grossly normal. Pulmonic valve  regurgitation is mild. No evidence of pulmonic stenosis.   Aorta: The aortic root and ascending aorta are structurally normal, with  no evidence of dilitation.   Venous: The inferior vena cava is normal in size with greater than 50%  respiratory variability, suggesting right atrial pressure of 3 mmHg.   IAS/Shunts: The atrial septum is grossly normal.     Recent Labs: 04/30/2022: Hemoglobin 13.7; Platelets 160.0 03/20/2023: ALT 12; BUN 12; Creatinine, Ser 0.90; Magnesium 2.2; Potassium 4.4; Sodium 142  Recent Lipid Panel    Component Value Date/Time   CHOL 54 04/30/2022 0926   CHOL 75 (L) 03/05/2021 0931   TRIG 70.0 04/30/2022 0926   HDL 40.50 04/30/2022 0926   HDL 40 03/05/2021 0931   CHOLHDL 1 04/30/2022 0926   VLDL  14.0 04/30/2022 0926   LDLCALC 0 04/30/2022 0926   LDLCALC 18 03/05/2021 0931   LDLDIRECT 110.0 09/13/2020 1008    Physical Exam:    VS:  BP 130/78 (BP Location: Right Arm, Patient Position: Sitting, Cuff Size: Normal)   Pulse (!) 55   Ht 6' (1.829 m)   Wt 189 lb 3.2 oz (85.8 kg)   SpO2 97%   BMI 25.66 kg/m     Wt Readings from Last 3 Encounters:  03/20/23 189 lb 3.2 oz (85.8 kg)  02/18/23 186 lb 9.6 oz (84.6 kg)  12/16/22 188 lb (85.3 kg)     GEN: Well nourished, well developed  in no acute distress HEENT: Normal NECK: No JVD; No carotid bruits LYMPHATICS: No lymphadenopathy CARDIAC: S1S2 noted,RRR, no murmurs, rubs, gallops RESPIRATORY:  Clear to auscultation without rales, wheezing or rhonchi  ABDOMEN: Soft, non-tender, non-distended, +bowel sounds, no guarding. EXTREMITIES: No edema, No cyanosis, no clubbing MUSCULOSKELETAL:  No deformity  SKIN: Warm and dry NEUROLOGIC:  Alert and oriented x 3, non-focal PSYCHIATRIC:  Normal affect, good insight  ASSESSMENT:    1. Essential hypertension   2. Dizziness   3. Postural dizziness with presyncope   4. Medication management   5. Sinus bradycardia   6. Coronary artery disease involving native coronary artery of native heart without angina pectoris   7. Mixed hyperlipidemia    PLAN:     CAD - no anginal symptoms.   In terms of his hyperlipidemia continue rosuvastatin, Repatha as well as Zetia.    Patient reported a single episode of dizziness while hiking a month ago. No loss of consciousness or palpitations reported. Family history of heart rhythm issues noted. -Order carotid ultrasound to rule out carotid blockages. -Recommend purchase of KardiaMobile device for on-the-spot EKG recording during episodes of dizziness. -Order 2-week heart monitor to investigate potential arrhythmias.  Pain Management Patient reported frequent use of high-dose Tylenol for bladder-related discomfort. -Order liver enzyme test to  monitor for potential liver damage due to Tylenol use.  Follow-up in 4 months (April 2025) to discuss results of carotid ultrasound and heart monitor. This appointment can be virtual if preferred by the patient. The patient is in agreement with the above plan. The patient left the office in stable condition.  The patient will follow up in 6 months or sooner if needed.   Medication Adjustments/Labs and Tests Ordered: Current medicines are reviewed at length with the patient today.  Concerns regarding medicines are outlined above.  Orders Placed This Encounter  Procedures   Comprehensive Metabolic Panel (CMET)   Magnesium   LONG TERM MONITOR (3-14 DAYS)   EKG 12-Lead   VAS US CAROTID   No orders of the defined types were placed in this encounter.   Patient Instructions  Medication Instructions:  Your physician recommends that you continue on your current medications as directed. Please refer to the Current Medication list given to you today.  *If you need a refill on your cardiac medications before your next appointment, please call your pharmacy*   Lab Work: CMET, Mag If you have labs (blood work) drawn today and your tests are completely normal, you will receive your results only by: MyChart Message (if you have MyChart) OR A paper copy in the mail If you have any lab test that is abnormal or we need to change your treatment, we will call you to review the results.   Testing/Procedures: Your physician has requested that you have a carotid duplex. This test is an ultrasound of the carotid arteries in your neck. It looks at blood flow through these arteries that supply the brain with blood. Allow one hour for this exam. There are no restrictions or special instructions.  ZIO XT- Long Term Monitor Instructions  Your physician has requested you wear a ZIO patch monitor for 14 days.  This is a single patch monitor. [employer] supplies one patch monitor per enrollment.  Additional stickers are not available. Please do not apply patch if you will be having a Nuclear Stress Test,  Echocardiogram, Cardiac CT, MRI, or Chest Xray during the period you would be wearing the  monitor. The patch cannot  be worn during these tests. You cannot remove and re-apply the  ZIO XT patch monitor.  Your ZIO patch monitor will be mailed 3 day USPS to your address on file. It may take 3-5 days  to receive your monitor after you have been enrolled.  Once you have received your monitor, please review the enclosed instructions. Your monitor  has already been registered assigning a specific monitor serial # to you.  Billing and Patient Assistance Program Information  We have supplied [employer] with any of your insurance information on file for billing purposes. [employer] offers a sliding scale Patient Assistance Program for patients that do not have  insurance, or whose insurance does not completely cover the cost of the ZIO monitor.  You must apply for the Patient Assistance Program to qualify for this discounted rate.  To apply, please call [employer] at [phone], select option 4, select option 2, ask to apply for  Patient Assistance Program. Meredeth Ide will ask your household income, and how many people  are in your household. They will quote your out-of-pocket cost based on that information.  [employer] will also be able to set up a 61-month, interest-free payment plan if needed.  Applying the monitor   Shave hair from upper left chest.  Hold abrader disc by orange tab. Rub abrader in 40 strokes over the upper left chest as  indicated in your monitor instructions.  Clean area with 4 enclosed alcohol pads. Let dry.  Apply patch as indicated in monitor instructions. Patch will be placed under collarbone on left  side of chest with arrow pointing upward.  Rub patch adhesive wings for 2 minutes. Remove white label marked "1". Remove the white  label marked "2". Rub patch adhesive wings  for 2 additional minutes.  While looking in a mirror, press and release button in center of patch. A small green light will  flash 3-4 times. This will be your only indicator that the monitor has been turned on.  Do not shower for the first 24 hours. You may shower after the first 24 hours.  Press the button if you feel a symptom. You will hear a small click. Record Date, Time and  Symptom in the Patient Logbook.  When you are ready to remove the patch, follow instructions on the last 2 pages of Patient  Logbook. Stick patch monitor onto the last page of Patient Logbook.  Place Patient Logbook in the blue and white box. Use locking tab on box and tape box closed  securely. The blue and white box has prepaid postage on it. Please place it in the mailbox as  soon as possible. Your physician should have your test results approximately 7 days after the  monitor has been mailed back to [facility].  Call [facility] Customer Care at [phone] if you have questions regarding  your ZIO XT patch monitor. Call them immediately if you see an orange light blinking on your  monitor.  If your monitor falls off in less than 4 days, contact our Monitor department at [phone].  If your monitor becomes loose or falls off after 4 days call [employer] at [phone] for  suggestions on securing your monitor    Follow-Up: At [facility], you and your health needs are our priority.  As part of our continuing mission to provide you with exceptional heart care, we have created designated Provider Care Teams.  These Care Teams include your primary Cardiologist (physician) and Advanced Practice Providers (APPs -  Physician  Assistants and Nurse Practitioners) who all work together to provide you with the care you need, when you need it.   Your next appointment:   4 month(s)  Provider:   Thomasene Ripple, DO     Other Instructions KardiaMobile  [email or web address]        FDA-cleared, clinical grade mobile EKG monitor: Lourena Simmonds is the most clinically-validated mobile EKG used by the world's leading cardiac care medical professionals With Basic service, know instantly if your heart rhythm is normal or if atrial fibrillation is detected, and email the last single EKG recording to yourself or your doctor Premium service, available for purchase through the Kardia app for $9.99 per month or $99 per year, includes unlimited history and storage of your EKG recordings, a monthly EKG summary report to share with your doctor, along with the ability to track your blood pressure, activity and weight Includes one KardiaMobile phone clip FREE SHIPPING: Standard delivery 1-3 business days. Orders placed by 11:00am PST will ship that afternoon. Otherwise, will ship next business day. All orders ship via [employer] from [place], [place]    [employer] - sending an EKG Download app and set up profile. Run EKG - by placing 1-2 fingers on the silver plates After EKG is complete - Download PDF  - Skip password (if you apply a password the provider will need it to view the EKG) Click share button (square with upward arrow) in bottom left corner To send: choose MyChart (first time log into MyChart)  Pop up window about sending ECG Click continue Choose type of message Choose provider Type subject and message Click send (EKG should be attached)  - To send additional EKGs in one message click the paperclip image and bottom of page to attach.       Adopting a Healthy Lifestyle.  Know what a healthy weight is for you (roughly BMI <25) and aim to maintain this   Aim for 7+ servings of fruits and vegetables daily   65-80+ fluid ounces of water or unsweet tea for healthy kidneys   Limit to max 1 drink of alcohol per day; avoid smoking/tobacco   Limit animal fats in diet for cholesterol and heart health - choose grass  fed whenever available   Avoid highly processed foods, and foods high in saturated/trans fats   Aim for low stress - take time to unwind and care for your mental health   Aim for 150 min of moderate intensity exercise weekly for heart health, and weights twice weekly for bone health   Aim for 7-9 hours of sleep daily   When it comes to diets, agreement about the perfect plan isnt easy to find, even among the experts. Experts at the [facility] of [employer] developed an idea known as the Healthy Eating Plate. Just imagine a plate divided into logical, healthy portions.   The emphasis is on diet quality:   Load up on vegetables and fruits - one-half of your plate: Aim for color and variety, and remember that potatoes dont count.   Go for whole grains - one-quarter of your plate: Whole wheat, barley, wheat berries, quinoa, oats, brown rice, and foods made with them. If you want pasta, go with whole wheat pasta.   Protein power - one-quarter of your plate: Fish, chicken, beans, and nuts are all healthy, versatile protein sources. Limit red meat.   The diet, however, does go beyond the plate, offering a few other suggestions.   Use healthy plant  oils, such as olive, canola, soy, corn, sunflower and peanut. Check the labels, and avoid partially hydrogenated oil, which have unhealthy trans fats.   If youre thirsty, drink water. Coffee and tea are good in moderation, but skip sugary drinks and limit milk and dairy products to one or two daily servings.   The type of carbohydrate in the diet is more important than the amount. Some sources of carbohydrates, such as vegetables, fruits, whole grains, and beans-are healthier than others.   Finally, stay active  Signed, Thomasene Ripple, DO  03/22/2023 3:05 AM    [facility]

## 2023-03-20 NOTE — Progress Notes (Unsigned)
Enrolled patient for a 14 day Zio XT  monitor to be mailed to patients home  °

## 2023-03-21 LAB — COMPREHENSIVE METABOLIC PANEL
ALT: 12 [IU]/L (ref 0–44)
AST: 15 [IU]/L (ref 0–40)
Albumin: 4.6 g/dL (ref 3.9–4.9)
Alkaline Phosphatase: 58 [IU]/L (ref 44–121)
BUN/Creatinine Ratio: 13 (ref 10–24)
BUN: 12 mg/dL (ref 8–27)
Bilirubin Total: 0.3 mg/dL (ref 0.0–1.2)
CO2: 23 mmol/L (ref 20–29)
Calcium: 9.1 mg/dL (ref 8.6–10.2)
Chloride: 104 mmol/L (ref 96–106)
Creatinine, Ser: 0.9 mg/dL (ref 0.76–1.27)
Globulin, Total: 2 g/dL (ref 1.5–4.5)
Glucose: 85 mg/dL (ref 70–99)
Potassium: 4.4 mmol/L (ref 3.5–5.2)
Sodium: 142 mmol/L (ref 134–144)
Total Protein: 6.6 g/dL (ref 6.0–8.5)
eGFR: 93 mL/min/{1.73_m2} (ref >59)

## 2023-03-21 LAB — MAGNESIUM: Magnesium: 2.2 mg/dL (ref 1.6–2.3)

## 2023-03-22 ENCOUNTER — Other Ambulatory Visit: Payer: Self-pay | Admitting: Cardiology

## 2023-03-23 ENCOUNTER — Ambulatory Visit (INDEPENDENT_AMBULATORY_CARE_PROVIDER_SITE_OTHER): Payer: PPO

## 2023-03-23 DIAGNOSIS — Z0000 Encounter for general adult medical examination without abnormal findings: Secondary | ICD-10-CM | POA: Diagnosis not present

## 2023-03-23 NOTE — Progress Notes (Signed)
Subjective:   Patrick [name] is a 67 y.o. male who presents for Medicare Annual/Subsequent preventive examination.  Visit Complete: Virtual I connected with  Patrick [name] on 03/23/23 by a audio enabled telemedicine application and verified that I am speaking with the correct person using two identifiers.  Patient Location: Home  Provider Location: Office/Clinic  I discussed the limitations of evaluation and management by telemedicine. The patient expressed understanding and agreed to proceed.  Vital Signs: Because this visit was a virtual/telehealth visit, some criteria may be missing or patient reported. Any vitals not documented were not able to be obtained and vitals that have been documented are patient reported.    Cardiac Risk Factors include: advanced age (>60men, >104 women);dyslipidemia;hypertension;male gender     Objective:    Today's Vitals   03/23/23 1559  PainSc: 2    There is no height or weight on file to calculate BMI.     03/23/2023    4:11 PM 03/17/2022    4:20 PM 10/12/2021    1:30 PM 03/05/2021    4:04 PM 11/13/2020    6:22 AM 11/02/2020    8:20 AM 02/14/2016   12:07 PM  Advanced Directives  Does Patient Have a Medical Advance Directive? Yes No No No No No No  Type of Estate agent of [place];Living will        Copy of Healthcare Power of Attorney in Chart? No - copy requested        Would patient like information on creating a medical advance directive?    No - Patient declined No - Patient declined Yes (MAU/Ambulatory/Procedural Areas - Information given)     Current Medications (verified) Outpatient Encounter Medications as of 03/23/2023  Medication Sig   Acetaminophen (TYLENOL ARTHRITIS PAIN PO) Take by mouth as needed.   amLODipine (NORVASC) 2.5 MG tablet TAKE ONE TABLET BY MOUTH ONE TIME DAILY   aspirin EC 81 MG tablet Take 81 mg by mouth daily. Swallow whole.   Evolocumab (REPATHA SURECLICK) 140 MG/ML SOAJ INJECT  (140MG ) UNDER THE SKIN EVERY 14 DAYS   ezetimibe (ZETIA) 10 MG tablet TAKE ONE TABLET BY MOUTH ONCE A DAY   finasteride (PROSCAR) 5 MG tablet TAKE ONE TABLET BY MOUTH ONE TIME DAILY   gabapentin (NEURONTIN) 400 MG capsule Take 1 capsule (400 mg total) by mouth 4 (four) times daily.   rosuvastatin (CRESTOR) 10 MG tablet TAKE ONE TABLET BY MOUTH DAILY IN THE EVENING   tamsulosin (FLOMAX) 0.4 MG CAPS capsule TAKE ONE CAPSULE BY MOUTH ONE TIME DAILY (Patient taking differently: Take 0.4 mg by mouth in the morning and at bedtime.)   nitroGLYCERIN (NITROSTAT) 0.4 MG SL tablet Place 1 tablet (0.4 mg total) under the tongue every 5 (five) minutes as needed for chest pain.   traMADol (ULTRAM) 50 MG tablet May take 1-2 nightly as needed for pain (Patient not taking: Reported on 03/23/2023)   No facility-administered encounter medications on file as of 03/23/2023.    Allergies (verified) Patient has no known allergies.   History: Past Medical History:  Diagnosis Date   Coronary artery disease    moderate   Hyperlipidemia    TIA (transient ischemic attack)    Past Surgical History:  Procedure Laterality Date   CORONARY ATHERECTOMY N/A 11/13/2020   Procedure: CORONARY ATHERECTOMY;  Surgeon: Tonny Bollman, MD;  Location: [facility] INVASIVE CV LAB;  Service: Cardiovascular;  Laterality: N/A;   CORONARY PRESSURE/FFR STUDY N/A 11/02/2020   Procedure:  INTRAVASCULAR PRESSURE WIRE/FFR STUDY;  Surgeon: Tonny Bollman, MD;  Location: [facility] INVASIVE CV LAB;  Service: Cardiovascular;  Laterality: N/A;   LEFT HEART CATH AND CORONARY ANGIOGRAPHY N/A 11/02/2020   Procedure: LEFT HEART CATH AND CORONARY ANGIOGRAPHY;  Surgeon: Tonny Bollman, MD;  Location: [facility] INVASIVE CV LAB;  Service: Cardiovascular;  Laterality: N/A;   MANDIBULAR HARDWARE REMOVAL N/A 02/21/2016   Procedure: MANDIBULAR HARDWARE REMOVAL;  Surgeon: Peggye Form, DO;  Location: [facility];  Service: Plastics;  Laterality: N/A;    ORIF MANDIBULAR FRACTURE N/A 01/07/2016   Procedure: OPEN REDUCTION INTERNAL FIXATION (ORIF) MANDIBULAR FRACTURE;  Surgeon: Alena Bills Dillingham, DO;  Location: [facility] OR;  Service: Plastics;  Laterality: N/A;   PLACEMENT OF LUMBAR DRAIN N/A 01/14/2016   Procedure: PLACEMENT OF LUMBAR DRAIN;  Surgeon: Tressie Stalker, MD;  Location: [facility] OR;  Service: Neurosurgery;  Laterality: N/A;   Family History  Problem Relation Age of Onset   Cancer Mother    Heart disease Father    Social History   Socioeconomic History   Marital status: Married    Spouse name: Not on file   Number of children: Not on file   Years of education: Not on file   Highest education level: Not on file  Occupational History   Not on file  Tobacco Use   Smoking status: Former    Current packs/day: 0.00    Types: Cigarettes    Quit date: 01/06/1995    Years since quitting: 28.2   Smokeless tobacco: Never  Vaping Use   Vaping status: Never Used  Substance and Sexual Activity   Alcohol use: Yes    Comment: occ   Drug use: No   Sexual activity: Not on file  Other Topics Concern   Not on file  Social History Narrative   Not on file   Social Drivers of Health   Financial Resource Strain: Low Risk  (03/23/2023)   Overall Financial Resource Strain (CARDIA)    Difficulty of Paying Living Expenses: Not hard at all  Food Insecurity: No Food Insecurity (03/23/2023)   Hunger Vital Sign    Worried About Running Out of Food in the Last Year: Never true    Ran Out of Food in the Last Year: Never true  Transportation Needs: No Transportation Needs (03/23/2023)   PRAPARE - Administrator, Civil Service (Medical): No    Lack of Transportation (Non-Medical): No  Physical Activity: Sufficiently Active (03/23/2023)   Exercise Vital Sign    Days of Exercise per Week: 3 days    Minutes of Exercise per Session: 60 min  Recent Concern: Physical Activity - Inactive (03/23/2023)   Exercise Vital Sign    Days of Exercise  per Week: 0 days    Minutes of Exercise per Session: 0 min  Stress: No Stress Concern Present (03/23/2023)   [employer] of Occupational Health - Occupational Stress Questionnaire    Feeling of Stress : Not at all  Social Connections: Unknown (03/23/2023)   Social Connection and Isolation Panel [NHANES]    Frequency of Communication with Friends and Family: Not on file    Frequency of Social Gatherings with Friends and Family: Not on file    Attends Religious Services: Never    Database administrator or Organizations: No    Attends Banker Meetings: Never    Marital Status: Married    Tobacco Counseling Counseling given: Not Answered   Clinical Intake:  Consulting civil engineer  completed: Yes  Pain : 0-10 Pain Score: 2  Pain Type: Chronic pain Pain Location: Groin Pain Onset: More than a month ago Pain Frequency: Constant     Nutritional Risks: None Diabetes: No  How often do you need to have someone help you when you read instructions, pamphlets, or other written materials from your doctor or pharmacy?: 1 - Never  Interpreter Needed?: No  Information entered by :: NAllen LPN   Activities of Daily Living    03/23/2023    4:01 PM  In your present state of health, do you have any difficulty performing the following activities:  Hearing? 0  Vision? 0  Difficulty concentrating or making decisions? 0  Walking or climbing stairs? 0  Dressing or bathing? 0  Doing errands, shopping? 0  Preparing Food and eating ? N  Using the Toilet? N  In the past six months, have you accidently leaked urine? N  Do you have problems with loss of bowel control? N  Managing your Medications? N  Managing your Finances? N  Housekeeping or managing your Housekeeping? N    Patient Care Team: Mliss Sax, MD as PCP - General (Family Medicine) Thomasene Ripple, DO as PCP - Cardiology (Cardiology)  Indicate any recent Medical Services you may have received from  other than [facility] providers in the past year (date may be approximate).     Assessment:   This is a routine wellness examination for Melquan.  Hearing/Vision screen Hearing Screening - Comments:: Denies hearing issues Vision Screening - Comments:: No regular eye exams,    Goals Addressed             This Visit's Progress    Patient Stated       03/23/2023, start exercising regularly       Depression Screen    03/23/2023    4:12 PM 04/29/2022    2:59 PM 03/17/2022    4:22 PM 11/12/2021    3:58 PM 10/22/2021    3:14 PM 10/04/2021    9:30 AM 03/19/2021   10:16 AM  PHQ 2/9 Scores  PHQ - 2 Score 0 0 0 0 0 0 0    Fall Risk    03/23/2023    4:11 PM 04/29/2022    2:59 PM 03/17/2022    4:21 PM 11/12/2021    3:58 PM 10/22/2021    3:15 PM  Fall Risk   Falls in the past year? 0 0 0 0 0  Number falls in past yr: 0 0 0 0 0  Injury with Fall? 0 0 0    Risk for fall due to : Medication side effect No Fall Risks Medication side effect    Follow up Falls prevention discussed;Falls evaluation completed Falls evaluation completed Falls prevention discussed;Education provided;Falls evaluation completed      MEDICARE RISK AT HOME: Medicare Risk at Home Any stairs in or around the home?: Yes If so, are there any without handrails?: No Home free of loose throw rugs in walkways, pet beds, electrical cords, etc?: Yes Adequate lighting in your home to reduce risk of falls?: Yes Life alert?: No Use of a cane, walker or w/c?: No Grab bars in the bathroom?: Yes Shower chair or bench in shower?: No Elevated toilet seat or a handicapped toilet?: No  TIMED UP AND GO:  Was the test performed?  No    Cognitive Function:        03/23/2023    4:13 PM 03/17/2022    4:26  PM  6CIT Screen  What Year? 0 points 0 points  What month? 0 points 0 points  What time? 0 points 0 points  Count back from 20 0 points 0 points  Months in reverse 0 points 0 points  Repeat phrase 0 points 0 points   Total Score 0 points 0 points    Immunizations Immunization History  Administered Date(s) Administered   Fluad Quad(high Dose 65+) 03/15/2022   Influenza,inj,Quad PF,6+ Mos 01/08/2016   Influenza-Unspecified 01/08/2016, 02/16/2021, 02/04/2023   PFIZER(Purple Top)SARS-COV-2 Vaccination 06/16/2019, 07/07/2019, 01/15/2020, 01/06/2021, 02/22/2021   PNEUMOCOCCAL CONJUGATE-20 03/19/2021   Pneumococcal Polysaccharide-23 08/22/2016, 08/22/2016   Tdap 10/05/2019   Zoster Recombinant(Shingrix) 02/04/2023    TDAP status: Up to date  Flu Vaccine status: Up to date  Pneumococcal vaccine status: Up to date  Covid-19 vaccine status: Information provided on how to obtain vaccines.   Qualifies for Shingles Vaccine? Yes   Zostavax completed No   Shingrix Completed?: needs second dose  Screening Tests Health Maintenance  Topic Date Due   Zoster Vaccines- Shingrix (2 of 2) 04/01/2023   Medicare Annual Wellness (AWV)  03/22/2024   Colonoscopy  08/19/2024   DTaP/Tdap/Td (2 - Td or Tdap) 10/04/2029   Pneumonia Vaccine 39+ Years old  Completed   INFLUENZA VACCINE  Completed   HPV VACCINES  Aged Out   COVID-19 Vaccine  Discontinued   Hepatitis C Screening  Discontinued    Health Maintenance  There are no preventive care reminders to display for this patient.   Colorectal cancer screening: scheduled for 05/19/2023  Lung Cancer Screening: (Low Dose CT Chest recommended if Age 28-80 years, 20 pack-year currently smoking OR have quit w/in 15years.) does not qualify.   Lung Cancer Screening Referral: no  Additional Screening:  Hepatitis C Screening: does not qualify;   Vision Screening: Recommended annual ophthalmology exams for early detection of glaucoma and other disorders of the eye. Is the patient up to date with their annual eye exam?  No  Who is the provider or what is the name of the office in which the patient attends annual eye exams? Can't remember name If pt is not  established with a provider, would they like to be referred to a provider to establish care? No .   Dental Screening: Recommended annual dental exams for proper oral hygiene  Diabetic Foot Exam: n/a  Community Resource Referral / Chronic Care Management: CRR required this visit?  No   CCM required this visit?  No     Plan:     I have personally reviewed and noted the following in the patient's chart:   Medical and social history Use of alcohol, tobacco or illicit drugs  Current medications and supplements including opioid prescriptions. Patient is not currently taking opioid prescriptions. Functional ability and status Nutritional status Physical activity Advanced directives List of other physicians Hospitalizations, surgeries, and ER visits in previous 12 months Vitals Screenings to include cognitive, depression, and falls Referrals and appointments  In addition, I have reviewed and discussed with patient certain preventive protocols, quality metrics, and best practice recommendations. A written personalized care plan for preventive services as well as general preventive health recommendations were provided to patient.     Barb Merino, LPN   13/11/6576   After Visit Summary: (MyChart) Due to this being a telephonic visit, the after visit summary with patients personalized plan was offered to patient via MyChart   Nurse Notes: none

## 2023-03-23 NOTE — Patient Instructions (Signed)
Patrick [name] , Thank you for taking time to come for your Medicare Wellness Visit. I appreciate your ongoing commitment to your health goals. Please review the following plan we discussed and let me know if I can assist you in the future.   Referrals/Orders/Follow-Ups/Clinician Recommendations: none  This is a list of the screening recommended for you and due dates:  Health Maintenance  Topic Date Due   Zoster (Shingles) Vaccine (1 of 2) Never done   Flu Shot  11/06/2022   Medicare Annual Wellness Visit  03/22/2024   Colon Cancer Screening  08/19/2024   DTaP/Tdap/Td vaccine (2 - Td or Tdap) 10/04/2029   Pneumonia Vaccine  Completed   HPV Vaccine  Aged Out   COVID-19 Vaccine  Discontinued   Hepatitis C Screening  Discontinued    Advanced directives: (Copy Requested) Please bring a copy of your health care power of attorney and living will to the office to be added to your chart at your convenience.  Next Medicare Annual Wellness Visit scheduled for next year: Yes  Insert Preventive Care attachment Insert FALL PREVENTION attachment if needed

## 2023-04-11 DIAGNOSIS — R55 Syncope and collapse: Secondary | ICD-10-CM | POA: Diagnosis not present

## 2023-04-11 DIAGNOSIS — R42 Dizziness and giddiness: Secondary | ICD-10-CM

## 2023-04-13 ENCOUNTER — Ambulatory Visit (HOSPITAL_COMMUNITY)
Admission: RE | Admit: 2023-04-13 | Discharge: 2023-04-13 | Disposition: A | Discharge status: Home / Self Care | Payer: PPO | Source: Ambulatory Visit | Attending: Cardiology | Admitting: Cardiology

## 2023-04-13 DIAGNOSIS — R42 Dizziness and giddiness: Secondary | ICD-10-CM | POA: Diagnosis not present

## 2023-04-13 DIAGNOSIS — R55 Syncope and collapse: Secondary | ICD-10-CM | POA: Insufficient documentation

## 2023-04-30 ENCOUNTER — Other Ambulatory Visit: Payer: Self-pay | Admitting: Family Medicine

## 2023-04-30 DIAGNOSIS — I2510 Atherosclerotic heart disease of native coronary artery without angina pectoris: Secondary | ICD-10-CM

## 2023-05-04 ENCOUNTER — Encounter: Payer: Self-pay | Admitting: Family Medicine

## 2023-05-04 ENCOUNTER — Ambulatory Visit (INDEPENDENT_AMBULATORY_CARE_PROVIDER_SITE_OTHER): Payer: PPO | Admitting: Family Medicine

## 2023-05-04 VITALS — BP 128/70 | HR 58 | Temp 97.1°F | Ht 72.0 in | Wt 190.2 lb

## 2023-05-04 DIAGNOSIS — Z0000 Encounter for general adult medical examination without abnormal findings: Secondary | ICD-10-CM | POA: Diagnosis not present

## 2023-05-04 DIAGNOSIS — Z23 Encounter for immunization: Secondary | ICD-10-CM | POA: Diagnosis not present

## 2023-05-04 DIAGNOSIS — I2510 Atherosclerotic heart disease of native coronary artery without angina pectoris: Secondary | ICD-10-CM | POA: Diagnosis not present

## 2023-05-04 DIAGNOSIS — Z125 Encounter for screening for malignant neoplasm of prostate: Secondary | ICD-10-CM

## 2023-05-04 DIAGNOSIS — R7303 Prediabetes: Secondary | ICD-10-CM

## 2023-05-04 DIAGNOSIS — I2583 Coronary atherosclerosis due to lipid rich plaque: Secondary | ICD-10-CM

## 2023-05-04 DIAGNOSIS — I10 Essential (primary) hypertension: Secondary | ICD-10-CM

## 2023-05-04 LAB — COMPREHENSIVE METABOLIC PANEL
ALT: 16 U/L (ref 0–53)
AST: 14 U/L (ref 0–37)
Albumin: 4.6 g/dL (ref 3.5–5.2)
Alkaline Phosphatase: 59 U/L (ref 39–117)
BUN: 14 mg/dL (ref 6–23)
CO2: 29 meq/L (ref 19–32)
Calcium: 9.2 mg/dL (ref 8.4–10.5)
Chloride: 105 meq/L (ref 96–112)
Creatinine, Ser: 0.89 mg/dL (ref 0.40–1.50)
GFR: 88.16 mL/min (ref >60.00)
Glucose, Bld: 100 mg/dL — ABNORMAL HIGH (ref 70–99)
Potassium: 4.2 meq/L (ref 3.5–5.1)
Sodium: 139 meq/L (ref 135–145)
Total Bilirubin: 0.5 mg/dL (ref 0.2–1.2)
Total Protein: 6.6 g/dL (ref 6.0–8.3)

## 2023-05-04 LAB — URINALYSIS, ROUTINE W REFLEX MICROSCOPIC
Bilirubin Urine: NEGATIVE
Hgb urine dipstick: NEGATIVE
Ketones, ur: NEGATIVE
Leukocytes,Ua: NEGATIVE
Nitrite: NEGATIVE
Specific Gravity, Urine: 1.025 (ref 1.000–1.030)
Total Protein, Urine: NEGATIVE
Urine Glucose: NEGATIVE
Urobilinogen, UA: 0.2 (ref 0.0–1.0)
WBC, UA: NONE SEEN (ref 0–?)
pH: 5.5 (ref 5.0–8.0)

## 2023-05-04 LAB — CBC
HCT: 41.7 % (ref 39.0–52.0)
Hemoglobin: 14 g/dL (ref 13.0–17.0)
MCHC: 33.5 g/dL (ref 30.0–36.0)
MCV: 92.6 fL (ref 78.0–100.0)
Platelets: 194 10*3/uL (ref 150.0–400.0)
RBC: 4.5 Mil/uL (ref 4.22–5.81)
RDW: 12.6 % (ref 11.5–15.5)
WBC: 4.9 10*3/uL (ref 4.0–10.5)

## 2023-05-04 LAB — PSA: PSA: 0.19 ng/mL (ref 0.10–4.00)

## 2023-05-04 LAB — HEMOGLOBIN A1C: Hgb A1c MFr Bld: 6 % (ref 4.6–6.5)

## 2023-05-04 LAB — LIPID PANEL
Cholesterol: 115 mg/dL (ref 0–200)
HDL: 50.3 mg/dL (ref >39.00)
LDL Cholesterol: 37 mg/dL (ref 0–99)
NonHDL: 64.95
Total CHOL/HDL Ratio: 2
Triglycerides: 139 mg/dL (ref 0.0–149.0)
VLDL: 27.8 mg/dL (ref 0.0–40.0)

## 2023-05-04 NOTE — Progress Notes (Signed)
Established Patient Office Visit   Subjective:  Patient ID: Patrick [name], male    DOB: [birth year]  Age: 68 y.o. MRN: [id number]  Chief Complaint  Patient presents with   Annual Exam    HPI Encounter Diagnoses  Name Primary?   Healthcare maintenance Yes   Need for shingles vaccine    Essential hypertension    Prediabetes    Coronary artery disease due to lipid rich plaque    Screening for prostate cancer    Here for physical.  Continues follow-up with cardiology skews me with history of CAD and hypertension.  Seeing urology for men's health and irritable bladder.  He has been taking Tylenol for irritable bladder with good result.  Will have his second Shingrix vaccine today.  Scheduled for colonoscopy in February.  Not exercising regularly at this time.  Does have regular dental care.  His father developed diabetes later in life.   Review of Systems  Constitutional: Negative.   HENT: Negative.    Eyes:  Negative for blurred vision, discharge and redness.  Respiratory: Negative.    Cardiovascular: Negative.   Gastrointestinal:  Negative for abdominal pain.  Genitourinary: Negative.   Musculoskeletal: Negative.  Negative for myalgias.  Skin:  Negative for rash.  Neurological:  Negative for tingling, loss of consciousness and weakness.  Endo/Heme/Allergies:  Negative for polydipsia.       05/04/2023    9:36 AM 03/23/2023    4:12 PM 04/29/2022    2:59 PM  Depression screen PHQ 2/9  Decreased Interest 0 0 0  Down, Depressed, Hopeless 0 0 0  PHQ - 2 Score 0 0 0  Altered sleeping 0    Tired, decreased energy 0    Change in appetite 0    Feeling bad or failure about yourself  0    Trouble concentrating 0    Moving slowly or fidgety/restless 0    Suicidal thoughts 0    PHQ-9 Score 0    Difficult doing work/chores Not difficult at all       Current Outpatient Medications:    Acetaminophen (TYLENOL ARTHRITIS PAIN PO), Take by mouth as needed., Disp: , Rfl:     amLODipine (NORVASC) 2.5 MG tablet, TAKE ONE TABLET BY MOUTH ONE TIME DAILY, Disp: 90 tablet, Rfl: 3   aspirin EC 81 MG tablet, Take 81 mg by mouth daily. Swallow whole., Disp: , Rfl:    Evolocumab (REPATHA SURECLICK) 140 MG/ML SOAJ, INJECT (140MG ) UNDER THE SKIN EVERY 14 DAYS, Disp: 6 mL, Rfl: 3   ezetimibe (ZETIA) 10 MG tablet, TAKE ONE TABLET BY MOUTH ONCE A DAY, Disp: 90 tablet, Rfl: 0   finasteride (PROSCAR) 5 MG tablet, TAKE ONE TABLET BY MOUTH ONE TIME DAILY, Disp: 90 tablet, Rfl: 3   gabapentin (NEURONTIN) 400 MG capsule, Take 1 capsule (400 mg total) by mouth 4 (four) times daily., Disp: 120 capsule, Rfl: 1   nitroGLYCERIN (NITROSTAT) 0.4 MG SL tablet, Place 1 tablet (0.4 mg total) under the tongue every 5 (five) minutes as needed for chest pain., Disp: 90 tablet, Rfl: 3   rosuvastatin (CRESTOR) 10 MG tablet, TAKE ONE TABLET BY MOUTH DAILY IN THE EVENING, Disp: 90 tablet, Rfl: 3   tamsulosin (FLOMAX) 0.4 MG CAPS capsule, TAKE ONE CAPSULE BY MOUTH ONE TIME DAILY (Patient taking differently: Take 0.4 mg by mouth in the morning and at bedtime.), Disp: 90 capsule, Rfl: 1   traMADol (ULTRAM) 50 MG tablet, May take 1-2 nightly as needed  for pain (Patient not taking: Reported on 03/23/2023), Disp: 30 tablet, Rfl: 0   Objective:     BP 128/70 (BP Location: Right Arm, Patient Position: Sitting, Cuff Size: Normal)   Pulse (!) 58   Temp (!) 97.1 F (36.2 C) (Temporal)   Ht 6' (1.829 m)   Wt 190 lb 3.2 oz (86.3 kg)   SpO2 96%   BMI 25.80 kg/m  Wt Readings from Last 3 Encounters:  05/04/23 190 lb 3.2 oz (86.3 kg)  03/20/23 189 lb 3.2 oz (85.8 kg)  02/18/23 186 lb 9.6 oz (84.6 kg)      Physical Exam Constitutional:      General: He is not in acute distress.    Appearance: Normal appearance. He is not ill-appearing, toxic-appearing or diaphoretic.  HENT:     Head: Normocephalic and atraumatic.     Right Ear: Tympanic membrane, ear canal and external ear normal.     Left Ear:  Tympanic membrane, ear canal and external ear normal.     Mouth/Throat:     Mouth: Mucous membranes are moist.     Pharynx: Oropharynx is clear. No oropharyngeal exudate or posterior oropharyngeal erythema.  Eyes:     General: No scleral icterus.       Right eye: No discharge.        Left eye: No discharge.     Extraocular Movements: Extraocular movements intact.     Conjunctiva/sclera: Conjunctivae normal.     Pupils: Pupils are equal, round, and reactive to light.  Cardiovascular:     Rate and Rhythm: Normal rate and regular rhythm.  Pulmonary:     Effort: Pulmonary effort is normal. No respiratory distress.     Breath sounds: Normal breath sounds.  Abdominal:     General: Bowel sounds are normal.     Tenderness: There is no abdominal tenderness. There is no guarding or rebound.  Musculoskeletal:     Cervical back: No rigidity or tenderness.  Lymphadenopathy:     Cervical: No cervical adenopathy.  Skin:    General: Skin is warm and dry.  Neurological:     Mental Status: He is alert and oriented to person, place, and time.  Psychiatric:        Mood and Affect: Mood normal.        Behavior: Behavior normal.      No results found for any visits on 05/04/23.    The ASCVD Risk score (Arnett DK, et al., 2019) failed to calculate for the following reasons:   Risk score cannot be calculated because patient has a medical history suggesting prior/existing ASCVD    Assessment & Plan:   Healthcare maintenance  Need for shingles vaccine -     Varicella-zoster vaccine IM  Essential hypertension -     CBC -     Comprehensive metabolic panel -     Urinalysis, Routine w reflex microscopic  Prediabetes -     Comprehensive metabolic panel -     Hemoglobin A1c  Coronary artery disease due to lipid rich plaque -     Lipid panel  Screening for prostate cancer -     PSA    Return in about 1 year (around 05/03/2024), or if symptoms worsen or fail to improve.  Encouraged  regular exercise for 30 minutes daily with a total of 150 minutes weekly.  Information on prediabetes was given.  Information on health maintenance and disease prevention were given.   Mliss Sax, MD

## 2023-05-06 DIAGNOSIS — R42 Dizziness and giddiness: Secondary | ICD-10-CM | POA: Diagnosis not present

## 2023-05-06 DIAGNOSIS — R55 Syncope and collapse: Secondary | ICD-10-CM | POA: Diagnosis not present

## 2023-05-14 ENCOUNTER — Encounter: Payer: PPO | Admitting: *Deleted

## 2023-05-14 ENCOUNTER — Other Ambulatory Visit: Payer: Self-pay

## 2023-05-14 DIAGNOSIS — Z006 Encounter for examination for normal comparison and control in clinical research program: Secondary | ICD-10-CM

## 2023-05-14 MED ORDER — STUDY - OCEAN(A) - OLPASIRAN (AMG 890) 142 MG/ML OR PLACEBO SQ INJECTION (PI-HILTY)
142.0000 mg | PREFILLED_SYRINGE | Freq: Once | SUBCUTANEOUS | Status: AC
  Administered 2023-05-14: 142 mg via SUBCUTANEOUS
  Filled 2023-05-14: qty 1

## 2023-05-14 NOTE — Progress Notes (Signed)
 Patrick [name] is in for continuing follow up in the [name] trial.  The patient has known calcific two vessel CAD and underwent atherectomy assisted PCI of the RCA and CFX.  Baseline Lp(a) was 532 nmol/L.  He continues to get along extremely well with no obvious side effects from IP.  We reiterated the double blind nature of the trial.   Alert, oriented gentleman in NAD Cognitive and good conversation without limitation   BP 119/66 (BP Location: Right Arm, Patient Position: Sitting, Cuff Size: Normal)   Pulse 58 Important    Temp 98 F (36.7 C) (Temporal)   Resp 16   Wt 188 lb (85.3 kg)   SpO2 99%   BMI 25.50 kg/m   BSA 2.08 m   Pain [employer] 0-No pain (Edu: Yes)  Lungs clear Cor regular without murmur Abd soft with obvious mass Ext  no edema  ECG  - NSR WNL  No change from tracing 11/13/2020  Impression   2 vessel CAD Elevated Lpa  Hyperlipidemia  Plan  Continue in trial. Continue CV fu with Dr. Sheena Ned D. Madelynn Malson MD Medical Director, [facility]

## 2023-05-14 NOTE — Research (Signed)
 Patrick [name]  Week 96  Vitals: [x]  Experience any AE/SAE/Hospitalizations [x]  Yes []  No  If yes please explain: Felt faint in November 2024. Saw Dr Sheena for this 03-20-2023. EKG- complete at 0942 MD to see:Dr [facility] collected: at 1025   Discussed with patient about the importance of not letting any one draw cholesterol or lipids. As to we are blinded to results. Reassured patient if we needed to be contacted the study team would reach out to our unblinded person.   No labs at next visit 108.    Non-Fatal Potential Endpoint Assessment Yes  No   Has the subject experienced/undergone any of the following since the last visit/contact?   []   [x]    Any Coronary Artery Revascularization/Cerebrovascular Revascularization/ Peripheral Artery Revascularization/Amputation Procedure   []   [x]    Myocardial Infarction []   [x]    Stroke   []   [x]    Provide the date for the non-fatal Potential Endpoints status:   []   [x]     IP admin please see MAR (please add Box # to comment section on MAR) [x]   Patrick [name] is here for Week 96. He reports no visits to the Ed or Urgent care, but did see cardiology on 03-20-2023 for feeling faint.  He states he had an episode like this years ago while hiking and passed out that time.  This time occurred in November 2024 also while hiking, he felt faint, but did not pass out.  Scheduled next visit for April 25 at 0930.    Current Outpatient Medications:    Acetaminophen  (TYLENOL  ARTHRITIS PAIN PO), Take by mouth as needed., Disp: , Rfl:    amLODipine  (NORVASC ) 2.5 MG tablet, TAKE ONE TABLET BY MOUTH ONE TIME DAILY, Disp: 90 tablet, Rfl: 3   aspirin  EC 81 MG tablet, Take 81 mg by mouth daily. Swallow whole., Disp: , Rfl:    Evolocumab  (REPATHA  SURECLICK) 140 MG/ML SOAJ, INJECT (140MG ) UNDER THE SKIN EVERY 14 DAYS, Disp: 6 mL, Rfl: 3   ezetimibe  (ZETIA ) 10 MG tablet, TAKE ONE TABLET BY MOUTH ONCE [name] DAY, Disp: 90 tablet, Rfl: 0   finasteride  (PROSCAR ) 5 MG tablet,  TAKE ONE TABLET BY MOUTH ONE TIME DAILY, Disp: 90 tablet, Rfl: 3   rosuvastatin  (CRESTOR ) 10 MG tablet, TAKE ONE TABLET BY MOUTH DAILY IN THE EVENING, Disp: 90 tablet, Rfl: 3   tamsulosin  (FLOMAX ) 0.4 MG CAPS capsule, TAKE ONE CAPSULE BY MOUTH ONE TIME DAILY (Patient taking differently: Take 0.4 mg by mouth in the morning and at bedtime.), Disp: 90 capsule, Rfl: 1   gabapentin  (NEURONTIN ) 400 MG capsule, Take 1 capsule (400 mg total) by mouth 4 (four) times daily. (Patient not taking: Reported on 05/14/2023), Disp: 120 capsule, Rfl: 1   nitroGLYCERIN  (NITROSTAT ) 0.4 MG SL tablet, Place 1 tablet (0.4 mg total) under the tongue every 5 (five) minutes as needed for chest pain., Disp: 90 tablet, Rfl: 3   traMADol  (ULTRAM ) 50 MG tablet, May take 1-2 nightly as needed for pain (Patient not taking: Reported on 05/14/2023), Disp: 30 tablet, Rfl: 0

## 2023-05-19 ENCOUNTER — Other Ambulatory Visit (HOSPITAL_COMMUNITY): Payer: Self-pay

## 2023-05-19 ENCOUNTER — Telehealth: Payer: Self-pay | Admitting: Pharmacy Technician

## 2023-05-19 NOTE — Telephone Encounter (Signed)
Pharmacy Patient Advocate Encounter   Received notification from CoverMyMeds that prior authorization for Repatha is required/requested.   Insurance verification completed.   The patient is insured through [facility] ADVANTAGE/RX ADVANCE .   Per test claim: PA required; PA submitted to above mentioned insurance via CoverMyMeds Key/confirmation #/EOC [id number] Status is pending

## 2023-05-19 NOTE — Research (Addendum)
 Are there any labs that are clinically significant?  Yes []  OR No[x]   Please FORWARD back to me with any changes or follow up!    Patrick KYM Maxcy, MD, [facility], FACP  [place]  [facility]  Medical Director of the [facility] Lip[facility] &  Cardi[facility] Risk Reduction Clinic Diplomate of the American Board of Clinical Lipidology Attending Cardiologist  Direct Dial: [phone]  Fax: [phone]  Website:  [email or web address]    INVESTIGATOR: ([id number])                          PROTOCOL   [id number]                     Patrick [name] M.D.                             INVESTIGATOR NO.: [id number]                     c/o Patrick [name]                              SUBJECT ID: [id number]                     [facility]                 SUBJECT INITIALS NOT COLLECTED:                     [address] [place] [address]                 VISIT: Patrick [name], Patrick [name] [place]  [id number]                   SPONSOR REPORT TO:                 COLLECTION TIME:10:25 DATE:14-May-2023                     Patrick [name]                      DATE RECEIVED IN LABORATORY: 15-May-2023                     c/o Sponsor(or Addtl) [employer].Study DATE REPORTED BY LABORATORY: 15-May-2023                     [facility]                          SEX: M  BIRTHDATE:  06-Oct-1955    AGE: 30b                     [address]                  SELMA NO.: [phone]                   [place], [place]  [phone]  Ref. Ranges               Clinical    Comments                                                                          Significance                                                                            Yes*  No                    FASTING GLUCOSE                      Glucose        106     H    70-100 mg/dL    (96/80/75 - 96)                             IS SUBJECT FASTING?                       Fasting?       Yes                                               EGFR                      CKDEPI eGF     93           mL/min/1.73m2                                                            No Ref Rng   HBA1C                      Hgb A1c        5.9          <6.5%         ANION GAP                      Anion Gap      17           7-18 mEq/L         ALT > 3 X ULN                      ALT>3XULN      Criteria not met  ALT > 5 X ULN                      ALT>5XULN      Criteria not met                                        ALT > 8 X ULN                      ALT>8XULN      Criteria not met                                        ALT & TBIL                      ALT & TBIL     Criteria not met                                        AST > 3 X ULN                      AST>3XULN      Criteria not met                                        AST > 5 X ULN                      AST>5XULN      Criteria not met                                        AST > 8 X ULN                      AST>8XULN      Criteria not met                                        AST & TBIL                      AST & TBIL     Criteria not met    CHEMISTRY PANEL                      Total Bili     0.4          0.2-1.2 mg/dL                                D-BilGen2      0.17         0.00-0.20 mg/dL                              Ind Bili  0.2          0.0-1.2 mg/dL                                Alk Phos       69           40-129 U/L                                   ALT (SGPT)     15           5-48 U/L                                    AST (SGOT)     16           8-40 U/L                                    Urea Nitr      14           4-24 mg/dL                                   Creatinine     0.89         0.45-1.35 mg/dL                              Calcium         9.3          8.3-10.6 mg/dL                              Total Prot     6.8          6.0-8.0 g/dL                                  Alb BCG        4.5          3.3-4.9 g/dL                                 CK             87           39-308 U/L                                   Sodium         139          135-145 mEq/L                                Potassium      4.6          3.5-5.2 mEq/L  Bicarb         26.0         19.3-29.3 mEq/L                              Chloride       103          94-112 mEq/L                               ADJUSTED CALCIUM                       Adj Calc       9.3          8.3-10.6 mg/dL    HEMATOLOGY&DIFFERENTIAL PANEL                      HGB            14.0         12.5-17.0 g/dL                              HCT            40           37-51 %                                      RBC            4.4          4.0-5.8 x106/uL                              MCH            32           26-34 pg                                    MCHC           35           31-38 g/dL                                   RDW            12.7         12.0-15.0 %                                 RBC Morph      No Review Required                                       MCV            91           80-100 fL  WBC            9.92         3.80-10.70 x103/uL                           Neutrophil     7.25    H    1.96-7.23 x103/uL   (12/16/22 - 2.45)                           Lymphocyte     1.85         0.80-3.00 x103/uL                           Monocytes      0.62         0.12-0.92 x103/uL                           Eosinophil     0.14         0.00-0.57 x103/uL                           Basophils      0.05         0.00-0.20 x103/uL                           Neutrophil     73.0         40.5-75.0 %                                 Lymphocyte     18.8         15.4-48.5%                                   Monocytes      6.3          2.6-10.1 %                                   Eosinophil     1.5          0.0-6.8 %                                     Basophils      0.5          0.0-2.0 %                                    Platelets      236          130-394 x103/uL                            ANC                      ANC            7.25  H    1.96-7.23 x103/uL   (12/16/22 - 2.45)  SM [id number] ANTIBODY COLL D/T                      CDate PreD     14-May-2023                                               CTime PreD     10:25    SM [id number] LPA COLLECTION D/T                      Coll Date      14-May-2023                                               Coll Time      10:25                       [employer]                      APTT           23.9         21.9-29.4 sec                                PT             9.9          9.7-12.3 sec                                 INR            0.9          Patient not taking                                                       oral anticoagulant:                                                      0.8 - 1.2                                                                Patient taking  oral anticoagulant:                                                      2.0 - 3.0                                  ALT & INR                      ALT & INR      To follow                                              AST & INR                      AST & INR      To follow    ALT & INR                      ALT & INR      Criteria not met                                        AST & INR                      AST & INR      Criteria not met

## 2023-05-19 NOTE — Telephone Encounter (Signed)
Pharmacy Patient Advocate Encounter  Received notification from [facility] ADVANTAGE/RX ADVANCE that Prior Authorization for repatha has been APPROVED from 05/19/23 to 05/18/24. Ran test claim, Copay is $4.80- one month. This test claim was processed through [facility]- copay amounts may vary at other pharmacies due to pharmacy/plan contracts, or as the patient moves through the different stages of their insurance plan.   PA #/Case ID/Reference #: [id number]

## 2023-05-22 ENCOUNTER — Encounter: Payer: Self-pay | Admitting: Cardiology

## 2023-06-10 ENCOUNTER — Other Ambulatory Visit: Payer: Self-pay | Admitting: Family Medicine

## 2023-06-10 DIAGNOSIS — N400 Enlarged prostate without lower urinary tract symptoms: Secondary | ICD-10-CM

## 2023-06-23 DIAGNOSIS — D2271 Melanocytic nevi of right lower limb, including hip: Secondary | ICD-10-CM | POA: Diagnosis not present

## 2023-06-23 DIAGNOSIS — D225 Melanocytic nevi of trunk: Secondary | ICD-10-CM | POA: Diagnosis not present

## 2023-06-23 DIAGNOSIS — Z1283 Encounter for screening for malignant neoplasm of skin: Secondary | ICD-10-CM | POA: Diagnosis not present

## 2023-06-23 DIAGNOSIS — X32XXXD Exposure to sunlight, subsequent encounter: Secondary | ICD-10-CM | POA: Diagnosis not present

## 2023-06-23 DIAGNOSIS — L570 Actinic keratosis: Secondary | ICD-10-CM | POA: Diagnosis not present

## 2023-06-23 DIAGNOSIS — D485 Neoplasm of uncertain behavior of skin: Secondary | ICD-10-CM | POA: Diagnosis not present

## 2023-07-09 ENCOUNTER — Other Ambulatory Visit: Payer: Self-pay | Admitting: Family Medicine

## 2023-07-09 DIAGNOSIS — I10 Essential (primary) hypertension: Secondary | ICD-10-CM

## 2023-07-09 DIAGNOSIS — I2510 Atherosclerotic heart disease of native coronary artery without angina pectoris: Secondary | ICD-10-CM

## 2023-07-21 ENCOUNTER — Ambulatory Visit: Payer: PPO | Admitting: Cardiology

## 2023-07-28 ENCOUNTER — Other Ambulatory Visit: Payer: Self-pay | Admitting: Family Medicine

## 2023-07-28 ENCOUNTER — Encounter: Payer: Self-pay | Admitting: *Deleted

## 2023-07-28 DIAGNOSIS — I2510 Atherosclerotic heart disease of native coronary artery without angina pectoris: Secondary | ICD-10-CM

## 2023-07-28 DIAGNOSIS — Z006 Encounter for examination for normal comparison and control in clinical research program: Secondary | ICD-10-CM

## 2023-08-12 DIAGNOSIS — Z1211 Encounter for screening for malignant neoplasm of colon: Secondary | ICD-10-CM | POA: Diagnosis not present

## 2023-08-12 DIAGNOSIS — Z800 Family history of malignant neoplasm of digestive organs: Secondary | ICD-10-CM | POA: Diagnosis not present

## 2023-08-12 DIAGNOSIS — K5730 Diverticulosis of large intestine without perforation or abscess without bleeding: Secondary | ICD-10-CM | POA: Diagnosis not present

## 2023-08-12 DIAGNOSIS — Z8673 Personal history of transient ischemic attack (TIA), and cerebral infarction without residual deficits: Secondary | ICD-10-CM | POA: Diagnosis not present

## 2023-08-28 ENCOUNTER — Ambulatory Visit: Payer: PPO | Attending: Cardiology | Admitting: Cardiology

## 2023-08-28 ENCOUNTER — Encounter: Payer: Self-pay | Admitting: Cardiology

## 2023-08-28 VITALS — BP 114/74 | HR 65 | Ht 72.0 in | Wt 195.2 lb

## 2023-08-28 DIAGNOSIS — I2510 Atherosclerotic heart disease of native coronary artery without angina pectoris: Secondary | ICD-10-CM | POA: Diagnosis not present

## 2023-08-28 DIAGNOSIS — E7849 Other hyperlipidemia: Secondary | ICD-10-CM

## 2023-08-28 NOTE — Patient Instructions (Signed)
 Medication Instructions:  Your physician recommends that you continue on your current medications as directed. Please refer to the Current Medication list given to you today.  *If you need a refill on your cardiac medications before your next appointment, please call your pharmacy*  Follow-Up: At [facility], you and your health needs are our priority.  As part of our continuing mission to provide you with exceptional heart care, our providers are all part of one team.  This team includes your primary Cardiologist (physician) and Advanced Practice Providers or APPs (Physician Assistants and Nurse Practitioners) who all work together to provide you with the care you need, when you need it.  Your next appointment:   12 month(s)  Provider:   Kardie Tobb, DO    We recommend signing up for the patient portal called "MyChart".  Sign up information is provided on this After Visit Summary.  MyChart is used to connect with patients for Virtual Visits (Telemedicine).  Patients are able to view lab/test results, encounter notes, upcoming appointments, etc.  Non-urgent messages can be sent to your provider as well.   To learn more about what you can do with MyChart, go to [email or web address].

## 2023-08-28 NOTE — Progress Notes (Signed)
 Cardiology Office Note:    Date:  08/29/2023   ID:  Patrick [name], DOB May 19, 1955, MRN [id number]  PCP:  Patrick Frederic, MD  Cardiologist:  Patrick Morin, DO  Electrophysiologist:  None   Referring MD: Patrick [name],*     History of Present Illness:    Patrick [name] is a 68 y.o. male with a hx of coronary artery disease status post PCI to the RCA and circumflex with atherectomy in the RCA as well on dual antiplatelet therapy with aspirin  and Plavix , hyperlipidemia now on PCSK9 inhibitors, history of TIA is here today for follow-up visit.   He offers no complaints at this time he is doing well.  He is on Crestor , Repatha , and Zetia , which have reduced his LDL levels to 37. He also takes aspirin  and has elevated Lp(a) levels.   He engages in regular walking for exercise, although gym activities were paused due to flu episodes over the winter. No new cardiovascular symptoms are present.   Past Medical History:  Diagnosis Date   Coronary artery disease    moderate   Hyperlipidemia    TIA (transient ischemic attack)     Past Surgical History:  Procedure Laterality Date   CORONARY ATHERECTOMY N/A 11/13/2020   Procedure: CORONARY ATHERECTOMY;  Surgeon: Arnoldo Lapping, MD;  Location: [facility] INVASIVE CV LAB;  Service: Cardiovascular;  Laterality: N/A;   CORONARY PRESSURE/FFR STUDY N/A 11/02/2020   Procedure: INTRAVASCULAR PRESSURE WIRE/FFR STUDY;  Surgeon: Arnoldo Lapping, MD;  Location: [facility] INVASIVE CV LAB;  Service: Cardiovascular;  Laterality: N/A;   LEFT HEART CATH AND CORONARY ANGIOGRAPHY N/A 11/02/2020   Procedure: LEFT HEART CATH AND CORONARY ANGIOGRAPHY;  Surgeon: Arnoldo Lapping, MD;  Location: [facility] INVASIVE CV LAB;  Service: Cardiovascular;  Laterality: N/A;   MANDIBULAR HARDWARE REMOVAL N/A 02/21/2016   Procedure: MANDIBULAR HARDWARE REMOVAL;  Surgeon: Thornell Flirt, DO;  Location: [facility];  Service: Plastics;  Laterality: N/A;   ORIF  MANDIBULAR FRACTURE N/A 01/07/2016   Procedure: OPEN REDUCTION INTERNAL FIXATION (ORIF) MANDIBULAR FRACTURE;  Surgeon: Lindaann Requena Dillingham, DO;  Location: [facility] OR;  Service: Plastics;  Laterality: N/A;   PLACEMENT OF LUMBAR DRAIN N/A 01/14/2016   Procedure: PLACEMENT OF LUMBAR DRAIN;  Surgeon: Garry Kansas, MD;  Location: [facility] OR;  Service: Neurosurgery;  Laterality: N/A;    Current Medications: Current Meds  Medication Sig   Acetaminophen  (TYLENOL  ARTHRITIS PAIN PO) Take by mouth as needed.   amLODipine  (NORVASC ) 2.5 MG tablet TAKE ONE TABLET BY MOUTH ONE TIME DAILY   aspirin  EC 81 MG tablet Take 81 mg by mouth daily. Swallow whole.   Evolocumab  (REPATHA  SURECLICK) 140 MG/ML SOAJ INJECT (140MG ) UNDER THE SKIN EVERY 14 DAYS   ezetimibe  (ZETIA ) 10 MG tablet TAKE ONE TABLET BY MOUTH ONCE A DAY   finasteride  (PROSCAR ) 5 MG tablet TAKE ONE TABLET BY MOUTH ONE TIME DAILY   gabapentin  (NEURONTIN ) 400 MG capsule Take 1 capsule (400 mg total) by mouth 4 (four) times daily.   rosuvastatin  (CRESTOR ) 10 MG tablet TAKE ONE TABLET BY MOUTH DAILY IN THE EVENING   tamsulosin  (FLOMAX ) 0.4 MG CAPS capsule TAKE ONE CAPSULE BY MOUTH ONE TIME DAILY (Patient taking differently: Take 0.4 mg by mouth in the morning and at bedtime.)     Allergies:   Patient has no known allergies.   Social History   Socioeconomic History   Marital status: Married    Spouse name: Not on file   Number  of children: Not on file   Years of education: Not on file   Highest education level: Not on file  Occupational History   Not on file  Tobacco Use   Smoking status: Former    Current packs/day: 0.00    Types: Cigarettes    Quit date: 01/06/1995    Years since quitting: 28.6   Smokeless tobacco: Never  Vaping Use   Vaping status: Never Used  Substance and Sexual Activity   Alcohol use: Yes    Comment: occ   Drug use: No   Sexual activity: Not on file  Other Topics Concern   Not on file  Social History Narrative    Not on file   Social Drivers of Health   Financial Resource Strain: Low Risk  (03/23/2023)   Overall Financial Resource Strain (CARDIA)    Difficulty of Paying Living Expenses: Not hard at all  Food Insecurity: No Food Insecurity (03/23/2023)   Hunger Vital Sign    Worried About Running Out of Food in the Last Year: Never true    Ran Out of Food in the Last Year: Never true  Transportation Needs: No Transportation Needs (03/23/2023)   PRAPARE - Administrator, Civil Service (Medical): No    Lack of Transportation (Non-Medical): No  Physical Activity: Sufficiently Active (03/23/2023)   Exercise Vital Sign    Days of Exercise per Week: 3 days    Minutes of Exercise per Session: 60 min  Recent Concern: Physical Activity - Inactive (03/23/2023)   Exercise Vital Sign    Days of Exercise per Week: 0 days    Minutes of Exercise per Session: 0 min  Stress: No Stress Concern Present (03/23/2023)   [employer] of Occupational Health - Occupational Stress Questionnaire    Feeling of Stress : Not at all  Social Connections: Unknown (03/23/2023)   Social Connection and Isolation Panel [NHANES]    Frequency of Communication with Friends and Family: Not on file    Frequency of Social Gatherings with Friends and Family: Not on file    Attends Religious Services: Never    Database administrator or Organizations: No    Attends Banker Meetings: Never    Marital Status: Married     Family History: The patient's family history includes Cancer in his mother; Heart disease in his father.  ROS:   Review of Systems  Constitution: Negative for decreased appetite, fever and weight gain.  HENT: Negative for congestion, ear discharge, hoarse voice and sore throat.   Eyes: Negative for discharge, redness, vision loss in right eye and visual halos.  Cardiovascular: Negative for chest pain, dyspnea on exertion, leg swelling, orthopnea and palpitations.  Respiratory:  Negative for cough, hemoptysis, shortness of breath and snoring.   Endocrine: Negative for heat intolerance and polyphagia.  Hematologic/Lymphatic: Negative for bleeding problem. Does not bruise/bleed easily.  Skin: Negative for flushing, nail changes, rash and suspicious lesions.  Musculoskeletal: Negative for arthritis, joint pain, muscle cramps, myalgias, neck pain and stiffness.  Gastrointestinal: Negative for abdominal pain, bowel incontinence, diarrhea and excessive appetite.  Genitourinary: Negative for decreased libido, genital sores and incomplete emptying.  Neurological: Negative for brief paralysis, focal weakness, headaches and loss of balance.  Psychiatric/Behavioral: Negative for altered mental status, depression and suicidal ideas.  Allergic/Immunologic: Negative for HIV exposure and persistent infections.    EKGs/Labs/Other Studies Reviewed:    The following studies were reviewed today:   EKG:  The ekg ordered today  demonstrates sinus rhythm, heart rate 71 bpm.  Transthoracic echocardiogram performed on October 31, 2020 IMPRESSIONS     1. Left ventricular ejection fraction, by estimation, is 50 to 55%. The  left ventricle has low normal function. The left ventricle has no regional  wall motion abnormalities. There is mild asymmetric left ventricular  hypertrophy. Left ventricular  diastolic parameters were normal.   2. Right ventricular systolic function is normal. The right ventricular  size is normal. Tricuspid regurgitation signal is inadequate for assessing  PA pressure.   3. The mitral valve is grossly normal. No evidence of mitral valve  regurgitation. No evidence of mitral stenosis.   4. The aortic valve is tricuspid. There is mild calcification of the  aortic valve. There is mild thickening of the aortic valve. Aortic valve  regurgitation is not visualized. No aortic stenosis is present.   5. The inferior vena cava is normal in size with greater than 50%   respiratory variability, suggesting right atrial pressure of 3 mmHg.   Comparison(s): A prior study was performed on 07/20/2006. Prior images  reviewed side by side. LV is slightly less vigorous from prior.   FINDINGS   Left Ventricle: Left ventricular ejection fraction, by estimation, is 50  to 55%. The left ventricle has low normal function. The left ventricle has  no regional wall motion abnormalities. The left ventricular internal  cavity size was normal in size.  There is mild asymmetric left ventricular hypertrophy. Left ventricular  diastolic parameters were normal.   Right Ventricle: The right ventricular size is normal. No increase in  right ventricular wall thickness. Right ventricular systolic function is  normal. Tricuspid regurgitation signal is inadequate for assessing PA  pressure.   Left Atrium: Left atrial size was normal in size.   Right Atrium: Right atrial size was normal in size.   Pericardium: There is no evidence of pericardial effusion.   Mitral Valve: The mitral valve is grossly normal. No evidence of mitral  valve regurgitation. No evidence of mitral valve stenosis.   Tricuspid Valve: The tricuspid valve is normal in structure. Tricuspid  valve regurgitation is trivial. No evidence of tricuspid stenosis.   Aortic Valve: The aortic valve is tricuspid. There is mild calcification  of the aortic valve. There is mild thickening of the aortic valve. There  is mild aortic valve annular calcification. Aortic valve regurgitation is  not visualized. No aortic stenosis   is present.   Pulmonic Valve: The pulmonic valve was grossly normal. Pulmonic valve  regurgitation is mild. No evidence of pulmonic stenosis.   Aorta: The aortic root and ascending aorta are structurally normal, with  no evidence of dilitation.   Venous: The inferior vena cava is normal in size with greater than 50%  respiratory variability, suggesting right atrial pressure of 3 mmHg.    IAS/Shunts: The atrial septum is grossly normal.     Recent Labs: 03/20/2023: Magnesium 2.2 05/04/2023: ALT 16; BUN 14; Creatinine, Ser 0.89; Hemoglobin 14.0; Platelets 194.0; Potassium 4.2; Sodium 139  Recent Lipid Panel    Component Value Date/Time   CHOL 115 05/04/2023 1015   CHOL 75 (L) 03/05/2021 0931   TRIG 139.0 05/04/2023 1015   HDL 50.30 05/04/2023 1015   HDL 40 03/05/2021 0931   CHOLHDL 2 05/04/2023 1015   VLDL 27.8 05/04/2023 1015   LDLCALC 37 05/04/2023 1015   LDLCALC 18 03/05/2021 0931   LDLDIRECT 110.0 09/13/2020 1008    Physical Exam:    VS:  BP  114/74 (BP Location: Left Arm, Patient Position: Sitting, Cuff Size: Normal)   Pulse 65   Ht 6' (1.829 m)   Wt 195 lb 3.2 oz (88.5 kg)   SpO2 95%   BMI 26.47 kg/m     Wt Readings from Last 3 Encounters:  08/28/23 195 lb 3.2 oz (88.5 kg)  05/14/23 188 lb (85.3 kg)  05/04/23 190 lb 3.2 oz (86.3 kg)     GEN: Well nourished, well developed in no acute distress HEENT: Normal NECK: No JVD; No carotid bruits LYMPHATICS: No lymphadenopathy CARDIAC: S1S2 noted,RRR, no murmurs, rubs, gallops RESPIRATORY:  Clear to auscultation without rales, wheezing or rhonchi  ABDOMEN: Soft, non-tender, non-distended, +bowel sounds, no guarding. EXTREMITIES: No edema, No cyanosis, no clubbing MUSCULOSKELETAL:  No deformity  SKIN: Warm and dry NEUROLOGIC:  Alert and oriented x 3, non-focal PSYCHIATRIC:  Normal affect, good insight  ASSESSMENT:    1. Coronary artery disease involving native coronary artery of native heart without angina pectoris   2. Familial hyperlipidemia     PLAN:    CAD - no anginal symptoms.   Hyperlipidemia LDL improved to 37 mg/dL, below target for secondary prevention. Current lipid management effective. Recommended Lp(a) testing for his adult children due to family history. - Continue Crestor , Repatha , and Zetia . - Monitor lipid levels as part of the study. - Advise his adult children to get  tested for Lp(a) levels.   Medication Adjustments/Labs and Tests Ordered: Current medicines are reviewed at length with the patient today.  Concerns regarding medicines are outlined above.  No orders of the defined types were placed in this encounter.  No orders of the defined types were placed in this encounter.   Patient Instructions  Medication Instructions:  Your physician recommends that you continue on your current medications as directed. Please refer to the Current Medication list given to you today.  *If you need a refill on your cardiac medications before your next appointment, please call your pharmacy*  Follow-Up: At [facility], you and your health needs are our priority.  As part of our continuing mission to provide you with exceptional heart care, our providers are all part of one team.  This team includes your primary Cardiologist (physician) and Advanced Practice Providers or APPs (Physician Assistants and Nurse Practitioners) who all work together to provide you with the care you need, when you need it.  Your next appointment:   12 month(s)  Provider:   Tobe Kervin, DO    We recommend signing up for the patient portal called "MyChart".  Sign up information is provided on this After Visit Summary.  MyChart is used to connect with patients for Virtual Visits (Telemedicine).  Patients are able to view lab/test results, encounter notes, upcoming appointments, etc.  Non-urgent messages can be sent to your provider as well.   To learn more about what you can do with MyChart, go to [email or web address].    Adopting a Healthy Lifestyle.  Know what a healthy weight is for you (roughly BMI <25) and aim to maintain this   Aim for 7+ servings of fruits and vegetables daily   65-80+ fluid ounces of water or unsweet tea for healthy kidneys   Limit to max 1 drink of alcohol per day; avoid smoking/tobacco   Limit animal fats in diet for cholesterol and heart  health - choose grass fed whenever available   Avoid highly processed foods, and foods high in saturated/trans fats   Aim for low stress -  take time to unwind and care for your mental health   Aim for 150 min of moderate intensity exercise weekly for heart health, and weights twice weekly for bone health   Aim for 7-9 hours of sleep daily   When it comes to diets, agreement about the perfect plan isnt easy to find, even among the experts. Experts at the [facility] [employer] developed an idea known as the Healthy Eating Plate. Just imagine a plate divided into logical, healthy portions.   The emphasis is on diet quality:   Load up on vegetables and fruits - one-half of your plate: Aim for color and variety, and remember that potatoes dont count.   Go for whole grains - one-quarter of your plate: Whole wheat, barley, wheat berries, quinoa, oats, brown rice, and foods made with them. If you want pasta, go with whole wheat pasta.   Protein power - one-quarter of your plate: Fish, chicken, beans, and nuts are all healthy, versatile protein sources. Limit red meat.   The diet, however, does go beyond the plate, offering a few other suggestions.   Use healthy plant oils, such as olive, canola, soy, corn, sunflower and peanut. Check the labels, and avoid partially hydrogenated oil, which have unhealthy trans fats.   If youre thirsty, drink water. Coffee and tea are good in moderation, but skip sugary drinks and limit milk and dairy products to one or two daily servings.   The type of carbohydrate in the diet is more important than the amount. Some sources of carbohydrates, such as vegetables, fruits, whole grains, and beans-are healthier than others.   Finally, stay active  Signed, Piero Mustard, DO  08/29/2023 1:56 PM    [facility]

## 2023-09-01 ENCOUNTER — Encounter

## 2023-09-02 ENCOUNTER — Encounter: Admitting: *Deleted

## 2023-09-02 DIAGNOSIS — Z006 Encounter for examination for normal comparison and control in clinical research program: Secondary | ICD-10-CM

## 2023-09-02 MED ORDER — STUDY - OCEAN(A) - OLPASIRAN (AMG 890) 142 MG/ML OR PLACEBO SQ INJECTION (PI-HILTY)
142.0000 mg | PREFILLED_SYRINGE | Freq: Once | SUBCUTANEOUS | Status: AC
  Administered 2023-09-02: 142 mg via SUBCUTANEOUS
  Filled 2023-09-02: qty 1

## 2023-09-02 NOTE — Research (Signed)
 [name] Informed Consent   Subject Name: Dell Briner Maltz  Subject met inclusion and exclusion criteria.  The informed consent form, study requirements and expectations were reviewed with the subject and questions and concerns were addressed prior to the signing of the consent form.  The subject verbalized understanding of the trial requirements.  The subject agreed to participate in the [place] trial and signed the informed consent on 09/02/2023.  The informed consent was obtained prior to performance of any protocol-specific procedures for the subject.  A copy of the signed informed consent was given to the subject and a copy was placed in the subject's medical record.     Subject re-consented to  Version: 7 IRB approved: [id number]  Ashley Lawrence

## 2023-09-02 NOTE — Research (Signed)
 Patrick [name] Patrick [name] [name]  Week 108  Vitals: [x]  Experience any AE/SAE/Hospitalizations []  Yes [x]  No  If yes please explain:  Labs collected:  no labs  Discussed with patient about the importance of not letting any one draw cholesterol or lipids. As to we are blinded to results. Reassured patient if we needed to be contacted the study team would reach out to our unblinded person.   ~reminder labs next visit 120 ~  Non-Fatal Potential Endpoint Assessment Yes  No   Has the subject experienced/undergone any of the following since the last visit/contact?   []   [x]    Any Coronary Artery Revascularization/Cerebrovascular Revascularization/ Peripheral Artery Revascularization/Amputation Procedure   []   [x]    Myocardial Infarction []   [x]    Stroke   []   [x]    Provide the date for the non-fatal Potential Endpoints status:   []   [x]     IP admin please see MAR (please add Box # to comment section on MAR) [x]    Current Outpatient Medications:    Acetaminophen  (TYLENOL  ARTHRITIS PAIN PO), Take by mouth as needed., Disp: , Rfl:    amLODipine  (NORVASC ) 2.5 MG tablet, TAKE ONE TABLET BY MOUTH ONE TIME DAILY, Disp: 90 tablet, Rfl: 0   aspirin  EC 81 MG tablet, Take 81 mg by mouth daily. Swallow whole., Disp: , Rfl:    Evolocumab  (REPATHA  SURECLICK) 140 MG/ML SOAJ, INJECT (140MG ) UNDER THE SKIN EVERY 14 DAYS, Disp: 6 mL, Rfl: 3   ezetimibe  (ZETIA ) 10 MG tablet, TAKE ONE TABLET BY MOUTH ONCE Patrick [name] [name] DAY, Disp: 90 tablet, Rfl: 0   finasteride  (PROSCAR ) 5 MG tablet, TAKE ONE TABLET BY MOUTH ONE TIME DAILY, Disp: 90 tablet, Rfl: 0   rosuvastatin  (CRESTOR ) 10 MG tablet, TAKE ONE TABLET BY MOUTH DAILY IN THE EVENING, Disp: 90 tablet, Rfl: 3   tamsulosin  (FLOMAX ) 0.4 MG CAPS capsule, TAKE ONE CAPSULE BY MOUTH ONE TIME DAILY (Patient taking differently: Take 0.4 mg by mouth in the morning and at bedtime.), Disp: 90 capsule, Rfl: 1   gabapentin  (NEURONTIN ) 400 MG capsule, Take 1 capsule (400 mg total) by mouth 4 (four) times  daily. (Patient not taking: Reported on 09/02/2023), Disp: 120 capsule, Rfl: 1   nitroGLYCERIN  (NITROSTAT ) 0.4 MG SL tablet, Place 1 tablet (0.4 mg total) under the tongue every 5 (five) minutes as needed for chest pain., Disp: 90 tablet, Rfl: 3  Current Facility-Administered Medications:    Study - Patrick [name](Patrick [name] [name]) - olpasiran (AMG 890) 142 mg/mL or placebo SQ injection (PI-Hilty), 142 mg, Subcutaneous, Once,

## 2023-09-04 ENCOUNTER — Other Ambulatory Visit: Payer: Self-pay | Admitting: Medical Genetics

## 2023-09-04 DIAGNOSIS — Z006 Encounter for examination for normal comparison and control in clinical research program: Secondary | ICD-10-CM

## 2023-09-14 ENCOUNTER — Other Ambulatory Visit: Payer: Self-pay | Admitting: Family Medicine

## 2023-09-14 DIAGNOSIS — N400 Enlarged prostate without lower urinary tract symptoms: Secondary | ICD-10-CM

## 2023-09-14 LAB — GENECONNECT MOLECULAR SCREEN: Genetic Analysis Overall Interpretation: NEGATIVE

## 2023-09-14 NOTE — Research (Signed)
 Late entry:   Spoke with patient about dizziness and date that it started.  Per patient it was around 20Nov2024 Will update information  Bernett Brill :) RN BSN  Clinical Research Nurse  Be strong and take heart, all you who hope in the [place]. ~ Psalm 31:24'

## 2023-10-01 ENCOUNTER — Other Ambulatory Visit: Payer: Self-pay | Admitting: Cardiology

## 2023-10-01 DIAGNOSIS — E785 Hyperlipidemia, unspecified: Secondary | ICD-10-CM

## 2023-10-01 DIAGNOSIS — I2510 Atherosclerotic heart disease of native coronary artery without angina pectoris: Secondary | ICD-10-CM

## 2023-10-07 ENCOUNTER — Other Ambulatory Visit: Payer: Self-pay

## 2023-10-07 ENCOUNTER — Other Ambulatory Visit: Payer: Self-pay | Admitting: Family Medicine

## 2023-10-07 DIAGNOSIS — I2510 Atherosclerotic heart disease of native coronary artery without angina pectoris: Secondary | ICD-10-CM

## 2023-10-07 DIAGNOSIS — I10 Essential (primary) hypertension: Secondary | ICD-10-CM

## 2023-10-12 ENCOUNTER — Other Ambulatory Visit: Payer: Self-pay | Admitting: Cardiology

## 2023-10-12 ENCOUNTER — Other Ambulatory Visit: Payer: Self-pay | Admitting: Pharmacist

## 2023-10-12 DIAGNOSIS — E785 Hyperlipidemia, unspecified: Secondary | ICD-10-CM

## 2023-10-12 DIAGNOSIS — I2510 Atherosclerotic heart disease of native coronary artery without angina pectoris: Secondary | ICD-10-CM

## 2023-10-27 ENCOUNTER — Encounter

## 2023-10-27 DIAGNOSIS — Z006 Encounter for examination for normal comparison and control in clinical research program: Secondary | ICD-10-CM

## 2023-10-27 MED ORDER — STUDY - OCEAN(A) - OLPASIRAN (AMG 890) 142 MG/ML OR PLACEBO SQ INJECTION (PI-HILTY)
142.0000 mg | PREFILLED_SYRINGE | Freq: Once | SUBCUTANEOUS | Status: AC
  Administered 2023-10-27: 142 mg via SUBCUTANEOUS
  Filled 2023-10-27: qty 1

## 2023-10-27 NOTE — Research (Signed)
 [name]  Week 120  Vitals: [x]  Med reviews: [x]  Labs: (week 24): [x]   0940 Experience any AE/SAE/Hospitalizations []  Yes [x]  No  If yes please explain:   Discussed with patient about the importance of not letting any one draw cholesterol or lipids. As to we are blinded to results. Reassured patient if we needed to be contacted the study team would reach out to our unblinded person.   ~no labs next visit 132 ~  Non-Fatal Potential Endpoint Assessment Yes  No   Has the subject experienced/undergone any of the following since the last visit/contact?   []   [x]    Any Coronary Artery Revascularization/Cerebrovascular Revascularization/ Peripheral Artery Revascularization/Amputation Procedure   []   [x]    Myocardial Infarction []   [x]    Stroke   []   [x]    Provide the date for the non-fatal Potential Endpoints status as of today visit date    []   [x]     IP admin please see MAR (please add Box # to comment section on MAR) [x]    Current Outpatient Medications:    Acetaminophen  (TYLENOL  ARTHRITIS PAIN PO), Take by mouth as needed., Disp: , Rfl:    amLODipine  (NORVASC ) 2.5 MG tablet, TAKE ONE TABLET BY MOUTH ONE TIME DAILY, Disp: 90 tablet, Rfl: 0   aspirin  EC 81 MG tablet, Take 81 mg by mouth daily. Swallow whole., Disp: , Rfl:    ezetimibe  (ZETIA ) 10 MG tablet, TAKE ONE TABLET BY MOUTH ONCE A DAY, Disp: 90 tablet, Rfl: 0   finasteride  (PROSCAR ) 5 MG tablet, TAKE ONE TABLET BY MOUTH ONE TIME DAILY, Disp: 90 tablet, Rfl: 0   nitroGLYCERIN  (NITROSTAT ) 0.4 MG SL tablet, Place 1 tablet (0.4 mg total) under the tongue every 5 (five) minutes as needed for chest pain., Disp: 90 tablet, Rfl: 3   rosuvastatin  (CRESTOR ) 10 MG tablet, TAKE ONE TABLET BY MOUTH DAILY IN THE EVENING, Disp: 90 tablet, Rfl: 3   tamsulosin  (FLOMAX ) 0.4 MG CAPS capsule, TAKE ONE CAPSULE BY MOUTH ONE TIME DAILY (Patient taking differently: Take 0.4 mg by mouth in the morning and at bedtime.), Disp: 90 capsule, Rfl: 1    Evolocumab  (REPATHA  SURECLICK) 140 MG/ML SOAJ, INJECT 1MG  (140MG ) UNDER THE SKIN EVERY 14 DAYS (Patient not taking: Reported on 10/27/2023), Disp: 6 mL, Rfl: 3   gabapentin  (NEURONTIN ) 400 MG capsule, Take 1 capsule (400 mg total) by mouth 4 (four) times daily. (Patient not taking: Reported on 10/27/2023), Disp: 120 capsule, Rfl: 1  Current Facility-Administered Medications:    Study - [name] - olpasiran (AMG 890) 142 mg/mL or placebo SQ injection (PI-Hilty), 142 mg, Subcutaneous, Once,

## 2023-10-28 ENCOUNTER — Other Ambulatory Visit: Payer: Self-pay | Admitting: Cardiology

## 2023-10-28 ENCOUNTER — Encounter: Payer: Self-pay | Admitting: Cardiology

## 2023-10-28 DIAGNOSIS — I2510 Atherosclerotic heart disease of native coronary artery without angina pectoris: Secondary | ICD-10-CM

## 2023-10-28 DIAGNOSIS — E785 Hyperlipidemia, unspecified: Secondary | ICD-10-CM

## 2023-10-30 NOTE — Research (Addendum)
 Are there any labs that are clinically significant?  Yes []  OR No[x]   Please FORWARD back to me with any changes or follow up!   Patrick KYM Maxcy, MD, [facility], FNLA, FACP  [facility]  Medical Director of the [facility] Lipid Disorders &  Cardiovascular Risk Reduction Clinic Diplomate of the American Board of Clinical Lipidology Attending Cardiologist  Direct Dial: [phone]  Fax: [phone]  Website:  [email or web address]   ACCESSION NO. [id number]                                             Page 1 of 1                                                        INVESTIGATOR: ([id number])                          PROTOCOL   [id number]                     Patrick [name] M.D.                             INVESTIGATOR NO.: [id number]                     c/o Reena Lies                              SUBJECT ID: [id number]                     [facility]                 SUBJECT INITIALS NOT COLLECTED:                     [address] [place] [id number]                 VISIT: PATRIC Mor[place], [place]  [id number]                   SPONSOR REPORT TO:                 COLLECTION TIME:09:40 DATE:27-Oct-2023                     Leora Shoe                      DATE RECEIVED IN LABORATORY: 28-Oct-2023                     c/o Sponsor(or Addtl) [facility].Study DATE REPORTED BY LABORATORY: 28-Oct-2023                     [facility]                          SEX:  M  BIRTHDATE:  06-Oct-1955    AGE: 29b                     [address]                  SELMA NO.: [phone]                   [place], [place]  [phone]                                                                                        Ref. Ranges               Clinical    Comments                                                                          Significance                                                                            Yes*  No                    SM [id number]  ANTIBODY COLL D/T                      CDate PreD     27-Oct-2023                                               CTime PreD     09:40                       HBA1C                      Hgb A1c        5.6          <6.5%                     ANION GAP                      Anion Gap      17           7-18 mEq/L                    CHEMISTRY PANEL  Total Bili     0.5          0.2-1.2 mg/dL                                D-BilGen2      0.18         0.00-0.36 mg/dL                              Ind Bili       0.3          0.0-1.2 mg/dL                                Alk Phos       52           40-129 U/L                                   ALT (SGPT)     14           5-48 U/L                                    AST (SGOT)     18           8-40 U/L                                    Urea Nitr      15           4-24 mg/dL                                   Creatinine     0.86         0.45-1.35 mg/dL                              Calcium         9.5          8.3-10.6 mg/dL                              Total Prot     6.5          6.0-8.0 g/dL                                 Alb BCG        4.6          3.3-4.6 g/dL                                 CK             84           39-308 U/L  Sodium         140          135-145 mEq/L                                Potassium      4.2          3.5-5.2 mEq/L                                Bicarb         20.9         19.3-29.3 mEq/L                              Chloride       106          94-112 mEq/L                               ADJUSTED CALCIUM                       Adj Calc       9.5          8.3-10.6 mg/dL                       HEMATOLOGY&DIFFERENTIAL PANEL                      HGB            13.9         12.5-17.0 g/dL                              HCT            41           37-51 %                                      RBC            4.6          4.0-5.8 x106/uL                              MCH            31            26-34 pg                                    MCHC           34           31-38 g/dL                                   RDW            13.1         12.0-15.0 %  RBC Morph      No Review Required                                       MCV            90           80-100 fL                                    WBC            4.17         3.80-10.70 x103/uL                           Neutrophil     2.12         1.96-7.23 x103/uL                           Lymphocyte     1.52         0.80-3.00 x103/uL                           Monocytes      0.44         0.12-0.92 x103/uL                           Eosinophil     0.06         0.00-0.57 x103/uL                           Basophils      0.04         0.00-0.20 x103/uL                           Neutrophil     50.9         40.5-75.0 %                                 Lymphocyte     36.5         15.4-48.5%                                   Monocytes      10.4    H    2.6-10.1 %                [ ]   [ ]                                     Eosinophil     1.3          0.0-6.8 %                                    Basophils      0.9  0.0-2.0 %                                    Platelets      194          130-394 x103/uL                            ANC                      ANC            2.12         1.96-7.23 x103/uL                     COAGULATION GROUP                      APTT           23.4         21.9-29.4 sec                                PT             10.1         9.7-12.3 sec                                 INR            0.9          Patient not taking                                                       oral anticoagulant:                                                      0.8 - 1.2                                                                Patient taking                                                          oral anticoagulant:                                                      2.0 - 3.0  EGFR                      CKDEPI eGF     94           mL/min/1.73m2                                                            No Ref Rng                        ALT > 3 X ULN                      ALT>3XULN      Criteria not met                                        ALT > 5 X ULN                      ALT>5XULN      Criteria not met                                        ALT > 8 X ULN                      ALT>8XULN      Criteria not met                                        ALT & TBIL                      ALT & TBIL     Criteria not met                                        AST > 3 X ULN                      AST>3XULN      Criteria not met                                        AST > 5 X ULN                      AST>5XULN      Criteria not met                                        AST > 8 X ULN                      AST>8XULN  Criteria not met                                        AST & TBIL                      AST & TBIL     Criteria not met                     ALT & INR                      ALT & INR      Criteria not met                                        AST & INR                      AST & INR      Criteria not met                       DECREASE >/= EGFR 50%                      eGFR > 50%     Criteria not met

## 2023-11-06 ENCOUNTER — Other Ambulatory Visit: Payer: Self-pay | Admitting: Cardiology

## 2023-11-06 DIAGNOSIS — E785 Hyperlipidemia, unspecified: Secondary | ICD-10-CM

## 2023-11-06 DIAGNOSIS — I2510 Atherosclerotic heart disease of native coronary artery without angina pectoris: Secondary | ICD-10-CM

## 2023-11-06 MED ORDER — REPATHA SURECLICK 140 MG/ML ~~LOC~~ SOAJ: 140.0000 mg | SUBCUTANEOUS | 3 refills | Status: AC

## 2023-11-20 ENCOUNTER — Other Ambulatory Visit: Payer: Self-pay | Admitting: Family Medicine

## 2023-11-20 DIAGNOSIS — I2510 Atherosclerotic heart disease of native coronary artery without angina pectoris: Secondary | ICD-10-CM

## 2023-12-08 ENCOUNTER — Other Ambulatory Visit: Payer: Self-pay | Admitting: Family Medicine

## 2023-12-08 DIAGNOSIS — N400 Enlarged prostate without lower urinary tract symptoms: Secondary | ICD-10-CM

## 2023-12-15 DIAGNOSIS — L570 Actinic keratosis: Secondary | ICD-10-CM | POA: Diagnosis not present

## 2023-12-15 DIAGNOSIS — X32XXXD Exposure to sunlight, subsequent encounter: Secondary | ICD-10-CM | POA: Diagnosis not present

## 2024-01-07 ENCOUNTER — Other Ambulatory Visit: Payer: Self-pay | Admitting: Family Medicine

## 2024-01-07 DIAGNOSIS — I2510 Atherosclerotic heart disease of native coronary artery without angina pectoris: Secondary | ICD-10-CM

## 2024-01-07 DIAGNOSIS — I10 Essential (primary) hypertension: Secondary | ICD-10-CM

## 2024-01-26 ENCOUNTER — Encounter: Admitting: *Deleted

## 2024-01-26 VITALS — BP 128/62 | HR 71 | Temp 97.9°F | Resp 16

## 2024-01-26 DIAGNOSIS — Z006 Encounter for examination for normal comparison and control in clinical research program: Secondary | ICD-10-CM

## 2024-01-26 MED ORDER — STUDY - OCEAN(A) - OLPASIRAN (AMG 890) 142 MG/ML OR PLACEBO SQ INJECTION (PI-HILTY)
142.0000 mg | PREFILLED_SYRINGE | Freq: Once | SUBCUTANEOUS | Status: AC
  Administered 2024-01-26: 142 mg via SUBCUTANEOUS
  Filled 2024-01-26: qty 1

## 2024-01-26 NOTE — Research (Signed)
 [name]  Week 132  Vitals: [x]  Med reviews: [x]  Experience any AE/SAE/Hospitalizations []  Yes [x]  No  If yes please explain:   Discussed with patient about the importance of not letting any one draw cholesterol or lipids. As to we are blinded to results. Reassured patient if we needed to be contacted the study team would reach out to our unblinded person.   ~MD to see and fasting labs next visit 144~  Non-Fatal Potential Endpoint Assessment Yes  No   Has the subject experienced/undergone any of the following since the last visit/contact?   []   [x]    Any Coronary Artery Revascularization/Cerebrovascular Revascularization/ Peripheral Artery Revascularization/Amputation Procedure   []   [x]    Myocardial Infarction []   [x]    Stroke   []   [x]    Provide the date for the non-fatal Potential Endpoints status as of today visit date    []   [x]     IP admin please see MAR (please add Box # to comment section on MAR) [x]    Current Outpatient Medications:    Acetaminophen  (TYLENOL  ARTHRITIS PAIN PO), Take by mouth as needed., Disp: , Rfl:    amLODipine  (NORVASC ) 2.5 MG tablet, TAKE ONE TABLET BY MOUTH ONE TIME DAILY, Disp: 90 tablet, Rfl: 0   aspirin  EC 81 MG tablet, Take 81 mg by mouth daily. Swallow whole., Disp: , Rfl:    Evolocumab  (REPATHA  SURECLICK) 140 MG/ML SOAJ, Inject 140 mg as directed every 14 (fourteen) days., Disp: 6 mL, Rfl: 3   ezetimibe  (ZETIA ) 10 MG tablet, TAKE ONE TABLET BY MOUTH ONCE A DAY, Disp: 90 tablet, Rfl: 0   finasteride  (PROSCAR ) 5 MG tablet, TAKE ONE TABLET BY MOUTH ONE TIME DAILY, Disp: 90 tablet, Rfl: 0   nitroGLYCERIN  (NITROSTAT ) 0.4 MG SL tablet, Place 1 tablet (0.4 mg total) under the tongue every 5 (five) minutes as needed for chest pain., Disp: 90 tablet, Rfl: 3   rosuvastatin  (CRESTOR ) 10 MG tablet, TAKE ONE TABLET BY MOUTH DAILY IN THE EVENING, Disp: 90 tablet, Rfl: 3   tamsulosin  (FLOMAX ) 0.4 MG CAPS capsule, TAKE ONE CAPSULE BY MOUTH ONE TIME DAILY, Disp:  90 capsule, Rfl: 1   gabapentin  (NEURONTIN ) 400 MG capsule, Take 1 capsule (400 mg total) by mouth 4 (four) times daily. (Patient not taking: Reported on 01/26/2024), Disp: 120 capsule, Rfl: 1

## 2024-02-18 ENCOUNTER — Other Ambulatory Visit: Payer: Self-pay | Admitting: Family Medicine

## 2024-02-18 DIAGNOSIS — N5201 Erectile dysfunction due to arterial insufficiency: Secondary | ICD-10-CM | POA: Diagnosis not present

## 2024-02-18 DIAGNOSIS — I2510 Atherosclerotic heart disease of native coronary artery without angina pectoris: Secondary | ICD-10-CM

## 2024-02-18 DIAGNOSIS — N400 Enlarged prostate without lower urinary tract symptoms: Secondary | ICD-10-CM | POA: Diagnosis not present

## 2024-02-18 DIAGNOSIS — N401 Enlarged prostate with lower urinary tract symptoms: Secondary | ICD-10-CM | POA: Diagnosis not present

## 2024-02-18 DIAGNOSIS — R3912 Poor urinary stream: Secondary | ICD-10-CM | POA: Diagnosis not present

## 2024-02-18 DIAGNOSIS — R399 Unspecified symptoms and signs involving the genitourinary system: Secondary | ICD-10-CM | POA: Diagnosis not present

## 2024-02-18 DIAGNOSIS — R351 Nocturia: Secondary | ICD-10-CM | POA: Diagnosis not present

## 2024-02-18 DIAGNOSIS — N201 Calculus of ureter: Secondary | ICD-10-CM | POA: Diagnosis not present

## 2024-02-18 DIAGNOSIS — R350 Frequency of micturition: Secondary | ICD-10-CM | POA: Diagnosis not present

## 2024-02-18 DIAGNOSIS — R3914 Feeling of incomplete bladder emptying: Secondary | ICD-10-CM | POA: Diagnosis not present

## 2024-03-04 ENCOUNTER — Other Ambulatory Visit: Payer: Self-pay | Admitting: Cardiology

## 2024-03-28 ENCOUNTER — Ambulatory Visit (INDEPENDENT_AMBULATORY_CARE_PROVIDER_SITE_OTHER): Payer: PPO

## 2024-03-28 VITALS — Ht 72.0 in | Wt 190.0 lb

## 2024-03-28 DIAGNOSIS — Z0000 Encounter for general adult medical examination without abnormal findings: Secondary | ICD-10-CM

## 2024-03-28 NOTE — Progress Notes (Addendum)
 "  Chief Complaint  Patient presents with   Medicare Wellness     Subjective:   Patrick [name] is a 68 y.o. male who presents for a Medicare Annual Wellness Visit.  Visit info / Clinical Intake: Medicare Wellness Visit Type:: Initial Annual Wellness Visit Persons participating in visit and providing information:: patient Medicare Wellness Visit Mode:: Telephone If telephone:: video declined Since this visit was completed virtually, some vitals may be partially provided or unavailable. Missing vitals are due to the limitations of the virtual format.: Documented vitals are patient reported If Telephone or Video please confirm:: I connected with patient using audio/video enable telemedicine. I verified patient identity with two identifiers, discussed telehealth limitations, and patient agreed to proceed. Patient Location:: home Provider Location:: office Interpreter Needed?: No Pre-visit prep was completed: yes AWV questionnaire completed by patient prior to visit?: no Living arrangements:: lives with spouse/significant other Patient's Overall Health Status Rating: good Typical amount of pain: some Does pain affect daily life?: no Are you currently prescribed opioids?: no  Dietary Habits and Nutritional Risks How many meals a day?: 2 Eats fruit and vegetables daily?: yes Most meals are obtained by: preparing own meals In the last 2 weeks, have you had any of the following?: none Diabetic:: no  Functional Status Activities of Daily Living (to include ambulation/medication): Independent Ambulation: Independent Medication Administration: Independent Home Management (perform basic housework or laundry): Independent Manage your own finances?: yes Primary transportation is: driving Concerns about vision?: no *vision screening is required for WTM* Concerns about hearing?: (!) yes Uses hearing aids?: no  Fall Screening Falls in the past year?: 0 Number of falls in past year:  0 Was there an injury with Fall?: 0 Fall Risk Category Calculator: 0 Patient Fall Risk Level: Low Fall Risk  Fall Risk Patient at Risk for Falls Due to: Medication side effect Fall risk Follow up: Falls prevention discussed; Falls evaluation completed  Home and Transportation Safety: All rugs have non-skid backing?: yes All stairs or steps have railings?: yes Grab bars in the bathtub or shower?: yes Have non-skid surface in bathtub or shower?: yes Good home lighting?: yes Regular seat belt use?: yes Hospital stays in the last year:: no  Cognitive Assessment Difficulty concentrating, remembering, or making decisions? : no Will 6CIT or Mini Cog be Completed: yes What year is it?: 0 points What month is it?: 0 points Give patient an address phrase to remember (5 components): [address] About what time is it?: 0 points Count backwards from 20 to 1: 0 points Say the months of the year in reverse: 0 points Repeat the address phrase from earlier: 0 points 6 CIT Score: 0 points  Advance Directives (For Healthcare) Does Patient Have a Medical Advance Directive?: Yes Does patient want to make changes to medical advance directive?: No - Patient declined Type of Advance Directive: Healthcare Power of [place]; Living will Copy of Healthcare Power of Attorney in Chart?: No - copy requested Copy of Living Will in Chart?: No - copy requested  Reviewed/Updated  Reviewed/Updated: Reviewed All (Medical, Surgical, Family, Medications, Allergies, Care Teams, Patient Goals)    Allergies (verified) Patient has no known allergies.   Current Medications (verified) Outpatient Encounter Medications as of 03/28/2024  Medication Sig   Acetaminophen  (TYLENOL  ARTHRITIS PAIN PO) Take by mouth as needed.   amLODipine  (NORVASC ) 2.5 MG tablet TAKE ONE TABLET BY MOUTH ONE TIME DAILY   aspirin  EC 81 MG tablet Take 81 mg by mouth daily.  Swallow whole.   Evolocumab  (REPATHA  SURECLICK) 140  MG/ML SOAJ Inject 140 mg as directed every 14 (fourteen) days.   ezetimibe  (ZETIA ) 10 MG tablet TAKE ONE TABLET BY MOUTH ONCE A DAY   finasteride  (PROSCAR ) 5 MG tablet TAKE ONE TABLET BY MOUTH ONE TIME DAILY   nitroGLYCERIN  (NITROSTAT ) 0.4 MG SL tablet Place 1 tablet (0.4 mg total) under the tongue every 5 (five) minutes as needed for chest pain.   rosuvastatin  (CRESTOR ) 10 MG tablet TAKE ONE TABLET BY MOUTH DAILY IN THE EVENING   tamsulosin  (FLOMAX ) 0.4 MG CAPS capsule TAKE ONE CAPSULE BY MOUTH ONE TIME DAILY   gabapentin  (NEURONTIN ) 400 MG capsule Take 1 capsule (400 mg total) by mouth 4 (four) times daily. (Patient not taking: Reported on 03/28/2024)   No facility-administered encounter medications on file as of 03/28/2024.    History: Past Medical History:  Diagnosis Date   Coronary artery disease    moderate   Hyperlipidemia    TIA (transient ischemic attack)    Past Surgical History:  Procedure Laterality Date   CORONARY ATHERECTOMY N/A 11/13/2020   Procedure: CORONARY ATHERECTOMY;  Surgeon: Wonda Sharper, MD;  Location: [facility] INVASIVE CV LAB;  Service: Cardiovascular;  Laterality: N/A;   CORONARY PRESSURE/FFR STUDY N/A 11/02/2020   Procedure: INTRAVASCULAR PRESSURE WIRE/FFR STUDY;  Surgeon: Wonda Sharper, MD;  Location: [facility] INVASIVE CV LAB;  Service: Cardiovascular;  Laterality: N/A;   LEFT HEART CATH AND CORONARY ANGIOGRAPHY N/A 11/02/2020   Procedure: LEFT HEART CATH AND CORONARY ANGIOGRAPHY;  Surgeon: Wonda Sharper, MD;  Location: [facility] INVASIVE CV LAB;  Service: Cardiovascular;  Laterality: N/A;   MANDIBULAR HARDWARE REMOVAL N/A 02/21/2016   Procedure: MANDIBULAR HARDWARE REMOVAL;  Surgeon: Estefana GORMAN Fritter, DO;  Location: [facility];  Service: Plastics;  Laterality: N/A;   ORIF MANDIBULAR FRACTURE N/A 01/07/2016   Procedure: OPEN REDUCTION INTERNAL FIXATION (ORIF) MANDIBULAR FRACTURE;  Surgeon: Estefana GORMAN Dillingham, DO;  Location: [facility] OR;  Service: Plastics;   Laterality: N/A;   PLACEMENT OF LUMBAR DRAIN N/A 01/14/2016   Procedure: PLACEMENT OF LUMBAR DRAIN;  Surgeon: Reyes Budge, MD;  Location: [facility] OR;  Service: Neurosurgery;  Laterality: N/A;   Family History  Problem Relation Age of Onset   Cancer Mother    Heart disease Father    Social History   Occupational History   Not on file  Tobacco Use   Smoking status: Former    Current packs/day: 0.00    Types: Cigarettes    Quit date: 01/06/1995    Years since quitting: 29.2   Smokeless tobacco: Never  Vaping Use   Vaping status: Never Used  Substance and Sexual Activity   Alcohol use: Yes    Comment: occ   Drug use: No   Sexual activity: Not on file   Tobacco Counseling Counseling given: Not Answered  SDOH Screenings   Food Insecurity: No Food Insecurity (03/28/2024)  Housing: Unknown (03/28/2024)  Transportation Needs: No Transportation Needs (03/28/2024)  Utilities: Not At Risk (03/28/2024)  Alcohol Screen: Low Risk (03/28/2024)  Depression (PHQ2-9): Low Risk (03/28/2024)  Financial Resource Strain: Low Risk (03/28/2024)  Physical Activity: Sufficiently Active (03/28/2024)  Social Connections: Moderately Integrated (03/28/2024)  Stress: No Stress Concern Present (03/28/2024)  Tobacco Use: Medium Risk (03/28/2024)  Health Literacy: Adequate Health Literacy (03/28/2024)   See flowsheets for full screening details  Depression Screen PHQ 2 & 9 Depression Scale- Over the past 2 weeks, how often have you been bothered by any of the following  problems? Little interest or pleasure in doing things: 0 Feeling down, depressed, or hopeless (PHQ Adolescent also includes...irritable): 0 PHQ-2 Total Score: 0 Trouble falling or staying asleep, or sleeping too much: 0 Feeling tired or having little energy: 3 Poor appetite or overeating (PHQ Adolescent also includes...weight loss): 0 Feeling bad about yourself - or that you are a failure or have let yourself or your family down:  0 Trouble concentrating on things, such as reading the newspaper or watching television (PHQ Adolescent also includes...like school work): 0 Moving or speaking so slowly that other people could have noticed. Or the opposite - being so fidgety or restless that you have been moving around a lot more than usual: 0 Thoughts that you would be better off dead, or of hurting yourself in some way: 0 PHQ-9 Total Score: 3 If you checked off any problems, how difficult have these problems made it for you to do your work, take care of things at home, or get along with other people?: Not difficult at all  Depression Treatment Depression Interventions/Treatment : EYV7-0 Score <4 Follow-up Not Indicated     Goals Addressed             This Visit's Progress    Patient Stated       03/28/2024, start going to the gym             Objective:    Today's Vitals   03/28/24 1618  Weight: 190 lb (86.2 kg)  Height: 6' (1.829 m)   Body mass index is 25.77 kg/m.  Hearing/Vision screen Hearing Screening - Comments:: Thinking about getting hearing Vision Screening - Comments:: Regular eye exams Immunizations and Health Maintenance Health Maintenance  Topic Date Due   Medicare Annual Wellness (AWV)  03/28/2025   DTaP/Tdap/Td (2 - Td or Tdap) 10/04/2029   Colonoscopy  08/11/2033   Pneumococcal Vaccine: 50+ Years  Completed   Influenza Vaccine  Completed   Zoster Vaccines- Shingrix   Completed   Meningococcal B Vaccine  Aged Out   COVID-19 Vaccine  Discontinued   Hepatitis C Screening  Discontinued        Assessment/Plan:  This is a routine wellness examination for Patrick [name].  Patient Care Team: Berneta Elsie Sayre, MD as PCP - General (Family Medicine) Sheena Pugh, DO as PCP - Cardiology (Cardiology) Selma Donnice SAUNDERS, MD as Consulting Physician (Urology)  I have personally reviewed and noted the following in the patients chart:   Medical and social history Use of alcohol, tobacco or  illicit drugs  Current medications and supplements including opioid prescriptions. Functional ability and status Nutritional status Physical activity Advanced directives List of other physicians Hospitalizations, surgeries, and ER visits in previous 12 months Vitals Screenings to include cognitive, depression, and falls Referrals and appointments  No orders of the defined types were placed in this encounter.  In addition, I have reviewed and discussed with patient certain preventive protocols, quality metrics, and best practice recommendations. A written personalized care plan for preventive services as well as general preventive health recommendations were provided to patient.   Ardella FORBES Dawn, LPN   [id number]   Return in 1 year (on 03/28/2025).  After Visit Summary: (Pick Up) Due to this being a telephonic visit, with patients personalized plan was offered to patient and patient has requested to Pick up at office.  Nurse Notes: Patient advised to keep follow-up appointment with PCP (05/05/2024) "

## 2024-03-28 NOTE — Patient Instructions (Addendum)
 Patrick [name],  Thank you for taking the time for your Medicare Wellness Visit. I appreciate your continued commitment to your health goals. Please review the care plan we discussed, and feel free to reach out if I can assist you further.  Please note that Annual Wellness Visits do not include a physical exam. Some assessments may be limited, especially if the visit was conducted virtually. If needed, we may recommend an in-person follow-up with your provider.  Ongoing Care Seeing your primary care provider every 3 to 6 months helps us  monitor your health and provide consistent, personalized care.   Referrals If a referral was made during today's visit and you haven't received any updates within two weeks, please contact the referred provider directly to check on the status.  Recommended Screenings:  Health Maintenance  Topic Date Due   Medicare Annual Wellness Visit  03/28/2025   DTaP/Tdap/Td vaccine (2 - Td or Tdap) 10/04/2029   Colon Cancer Screening  08/11/2033   Pneumococcal Vaccine for age over 16  Completed   Flu Shot  Completed   Zoster (Shingles) Vaccine  Completed   Meningitis B Vaccine  Aged Out   COVID-19 Vaccine  Discontinued   Hepatitis C Screening  Discontinued       03/28/2024    4:24 PM  Advanced Directives  Does Patient Have a Medical Advance Directive? Yes  Type of Estate Agent of [place];Living will  Does patient want to make changes to medical advance directive? No - Patient declined  Copy of Healthcare Power of Attorney in Chart? No - copy requested    Vision: Annual vision screenings are recommended for early detection of glaucoma, cataracts, and diabetic retinopathy. These exams can also reveal signs of chronic conditions such as diabetes and high blood pressure.  Dental: Annual dental screenings help detect early signs of oral cancer, gum disease, and other conditions linked to overall health, including heart disease and  diabetes.  Please see the attached documents for additional preventive care recommendations.

## 2024-04-05 ENCOUNTER — Other Ambulatory Visit: Payer: Self-pay | Admitting: Family Medicine

## 2024-04-05 DIAGNOSIS — I10 Essential (primary) hypertension: Secondary | ICD-10-CM

## 2024-04-05 DIAGNOSIS — I2510 Atherosclerotic heart disease of native coronary artery without angina pectoris: Secondary | ICD-10-CM

## 2024-04-21 ENCOUNTER — Encounter

## 2024-04-28 ENCOUNTER — Encounter: Admitting: *Deleted

## 2024-04-28 VITALS — BP 125/75 | HR 65 | Temp 97.3°F | Resp 16 | Wt 192.0 lb

## 2024-04-28 DIAGNOSIS — Z006 Encounter for examination for normal comparison and control in clinical research program: Secondary | ICD-10-CM

## 2024-04-28 MED ORDER — STUDY - OCEAN(A) - OLPASIRAN (AMG 890) 142 MG/ML OR PLACEBO SQ INJECTION (PI-HILTY)
142.0000 mg | PREFILLED_SYRINGE | Freq: Once | SUBCUTANEOUS | Status: AC
  Administered 2024-04-28: 142 mg via SUBCUTANEOUS
  Filled 2024-04-28: qty 1

## 2024-04-28 NOTE — Progress Notes (Deleted)
 [facility]  Research Note  Date:  04/28/2024  ID:  Shaunak Kreis, DOB October 18, 1955, MRN [id number]  Chief Complaint and HPI   Patrick [name] is a 69 y.o. male currently enrolled in the [place]) trial.  Today patient denies chest pain, shortness of breath, lower extremity edema, fatigue, palpitations, melena, hematuria, hemoptysis, diaphoresis, weakness, syncope, orthopnea, and PND.    Studies Reviewed   EKG:  Sinus bradycardia with HR 58   Physical Exam  VS:  BP 125/75 (BP Location: Left Arm, Patient Position: Sitting, Cuff Size: Normal)   Pulse 65   Temp (!) 97.3 F (36.3 C)   Resp 16   Wt 192 lb (87.1 kg)   SpO2 97%   BMI 26.04 kg/m    Wt Readings from Last 3 Encounters:  04/28/24 192 lb (87.1 kg)  03/28/24 190 lb (86.2 kg)  10/27/23 195 lb 9.6 oz (88.7 kg)     Physical Exam General: Well developed, well nourished, male appearing in no acute distress. Head: Normocephalic, atraumatic.  Neck: Supple without bruits, JVD. Lungs:  Resp regular and unlabored, CTA. Heart: RRR, S1, S2, no S3, S4, or murmur; no rub. Abdomen: Soft, non-tender, non-distended with normoactive bowel sounds. No hepatomegaly. No rebound/guarding. No obvious abdominal masses. Extremities: No edema. Distal pedal pulses are 2+ bilaterally. Neuro: Alert and oriented X 3. Moves all extremities spontaneously. Psych: Normal affect.   Assessment and Plan   The patient was given the opportunity to ask any further questions about the study that were not addressed by the research nurse coordinators - he has no further questions and wants to proceed at this time.  Signed, Jon Nat Hails, PA

## 2024-04-28 NOTE — Progress Notes (Signed)
 [facility]  Research Note  Date:  04/28/2024  ID:  Patrick [name], Patrick [name] 11-23-1955, MRN [id number]  Chief Complaint and HPI  Patrick [name] is a 69 y.o. male enrolled in [place]).  No complaints, ER/UC visits, or hospitalizations.   He denies chest pain, shortness of breath, lower extremity edema, fatigue, palpitations, melena, hematuria, hemoptysis, diaphoresis, weakness, syncope, orthopnea, and PND.    Studies Reviewed   EKG:   Sinus bradycardia with HR 58 Not clinically significant   Physical Exam  VS:  BP 125/75 (BP Location: Left Arm, Patient Position: Sitting, Cuff Size: Normal)   Pulse 65   Temp (!) 97.3 F (36.3 C)   Resp 16   Wt 192 lb (87.1 kg)   SpO2 97%   BMI 26.04 kg/m    Wt Readings from Last 3 Encounters:  04/28/24 192 lb (87.1 kg)  03/28/24 190 lb (86.2 kg)  10/27/23 195 lb 9.6 oz (88.7 kg)     Physical Exam General: Well developed, well nourished, male appearing in no acute distress. Head: Normocephalic, atraumatic.  Neck: Supple without bruits, JVD. Lungs:  Resp regular and unlabored, CTA. Heart: RRR, S1, S2, no S3, S4, or murmur; no rub. Abdomen: Soft, non-tender, non-distended with normoactive bowel sounds. No hepatomegaly. No rebound/guarding. No obvious abdominal masses. Extremities: No edema. Distal pedal pulses are 2+ bilaterally. Neuro: Alert and oriented X 3. Moves all extremities spontaneously. Psych: Normal affect.   Assessment and Plan   The patient was given the opportunity to ask any further questions about the study that were not addressed by the research nurse coordinators - he has no further questions and wants to proceed at this time.   Signed, Jon Nat Hails, PhD, PA-C 04/28/2024, 11:09 AM [phone] [facility] [address] [place], [place] [address]

## 2024-04-28 NOTE — Research (Signed)
 Patrick [name]  Week 144  Vitals: [x]  Experience any AE/SAE/Hospitalizations []  Yes [x]  No  If yes please explain:  EKG: [x]  MD to see: [x]  Labs collected:  0945  Discussed with patient about the importance of not letting any one draw cholesterol or lipids. As to we are blinded to results. Reassured patient if we needed to be contacted the study team would reach out to our unblinded person.   No labs at next visit 156   Non-Fatal Potential Endpoint Assessment Yes  No   Has the subject experienced/undergone any of the following since the last visit/contact?   []   [x]    Any Coronary Artery Revascularization/Cerebrovascular Revascularization/ Peripheral Artery Revascularization/Amputation Procedure   []   [x]    Myocardial Infarction []   [x]    Stroke   []   [x]    Provide the date for the non-fatal Potential Endpoints status as of today visit date    []   [x]     IP admin please see MAR (please add Box # to comment section on MAR) [x]   Current Medications[1]     [1]  Current Outpatient Medications:    Acetaminophen  (TYLENOL  ARTHRITIS PAIN PO), Take by mouth as needed., Disp: , Rfl:    amLODipine  (NORVASC ) 2.5 MG tablet, TAKE ONE TABLET BY MOUTH ONCE [name] DAY, Disp: 90 tablet, Rfl: 0   aspirin  EC 81 MG tablet, Take 81 mg by mouth daily. Swallow whole., Disp: , Rfl:    Evolocumab  (REPATHA  SURECLICK) 140 MG/ML SOAJ, Inject 140 mg as directed every 14 (fourteen) days., Disp: 6 mL, Rfl: 3   ezetimibe  (ZETIA ) 10 MG tablet, TAKE ONE TABLET BY MOUTH ONCE [name] DAY, Disp: 90 tablet, Rfl: 0   finasteride  (PROSCAR ) 5 MG tablet, TAKE ONE TABLET BY MOUTH ONE TIME DAILY, Disp: 90 tablet, Rfl: 0   nitroGLYCERIN  (NITROSTAT ) 0.4 MG SL tablet, Place 1 tablet (0.4 mg total) under the tongue every 5 (five) minutes as needed for chest pain., Disp: 90 tablet, Rfl: 3   rosuvastatin  (CRESTOR ) 10 MG tablet, TAKE ONE TABLET BY MOUTH DAILY IN THE EVENING, Disp: 90 tablet, Rfl: 3   tamsulosin  (FLOMAX ) 0.4 MG CAPS capsule,  TAKE ONE CAPSULE BY MOUTH ONE TIME DAILY (Patient taking differently: Take 0.4 mg by mouth in the morning and at bedtime.), Disp: 90 capsule, Rfl: 1   gabapentin  (NEURONTIN ) 400 MG capsule, Take 1 capsule (400 mg total) by mouth 4 (four) times daily. (Patient not taking: Reported on 04/28/2024), Disp: 120 capsule, Rfl: 1  Current Facility-Administered Medications:    Study - Patrick([name]) - olpasiran (AMG 890) 142 mg/mL or placebo SQ injection (PI-[facility]), 142 mg, Subcutaneous, Once,

## 2024-05-04 ENCOUNTER — Encounter: Payer: Self-pay | Admitting: Family Medicine

## 2024-05-05 ENCOUNTER — Ambulatory Visit: Payer: Self-pay | Admitting: Family Medicine

## 2024-05-05 ENCOUNTER — Ambulatory Visit: Payer: PPO | Admitting: Family Medicine

## 2024-05-05 ENCOUNTER — Encounter: Payer: Self-pay | Admitting: Family Medicine

## 2024-05-05 VITALS — BP 108/70 | HR 65 | Temp 97.9°F | Ht 72.0 in | Wt 195.2 lb

## 2024-05-05 DIAGNOSIS — I10 Essential (primary) hypertension: Secondary | ICD-10-CM

## 2024-05-05 DIAGNOSIS — I2510 Atherosclerotic heart disease of native coronary artery without angina pectoris: Secondary | ICD-10-CM

## 2024-05-05 DIAGNOSIS — N400 Enlarged prostate without lower urinary tract symptoms: Secondary | ICD-10-CM

## 2024-05-05 DIAGNOSIS — Z0000 Encounter for general adult medical examination without abnormal findings: Secondary | ICD-10-CM | POA: Diagnosis not present

## 2024-05-05 DIAGNOSIS — R7303 Prediabetes: Secondary | ICD-10-CM | POA: Diagnosis not present

## 2024-05-05 DIAGNOSIS — I2583 Coronary atherosclerosis due to lipid rich plaque: Secondary | ICD-10-CM

## 2024-05-05 LAB — HEMOGLOBIN A1C: Hgb A1c MFr Bld: 5.9 % (ref 4.6–6.5)

## 2024-05-05 LAB — URINALYSIS, ROUTINE W REFLEX MICROSCOPIC
Bilirubin Urine: NEGATIVE
Hgb urine dipstick: NEGATIVE
Ketones, ur: NEGATIVE
Leukocytes,Ua: NEGATIVE
Nitrite: NEGATIVE
Specific Gravity, Urine: 1.02 (ref 1.000–1.030)
Total Protein, Urine: NEGATIVE
Urine Glucose: NEGATIVE
Urobilinogen, UA: 0.2 (ref 0.0–1.0)
pH: 5.5 (ref 5.0–8.0)

## 2024-05-05 LAB — CBC WITH DIFFERENTIAL/PLATELET
Basophils Absolute: 0 10*3/uL (ref 0.0–0.1)
Basophils Relative: 0.6 % (ref 0.0–3.0)
Eosinophils Absolute: 0.1 10*3/uL (ref 0.0–0.7)
Eosinophils Relative: 1.7 % (ref 0.0–5.0)
HCT: 43.4 % (ref 39.0–52.0)
Hemoglobin: 14.4 g/dL (ref 13.0–17.0)
Lymphocytes Relative: 34.9 % (ref 12.0–46.0)
Lymphs Abs: 1.5 10*3/uL (ref 0.7–4.0)
MCHC: 33.2 g/dL (ref 30.0–36.0)
MCV: 91.2 fl (ref 78.0–100.0)
Monocytes Absolute: 0.4 10*3/uL (ref 0.1–1.0)
Monocytes Relative: 9 % (ref 3.0–12.0)
Neutro Abs: 2.3 10*3/uL (ref 1.4–7.7)
Neutrophils Relative %: 53.8 % (ref 43.0–77.0)
Platelets: 181 10*3/uL (ref 150.0–400.0)
RBC: 4.76 Mil/uL (ref 4.22–5.81)
RDW: 12.8 % (ref 11.5–15.5)
WBC: 4.2 10*3/uL (ref 4.0–10.5)

## 2024-05-05 LAB — COMPREHENSIVE METABOLIC PANEL WITH GFR
ALT: 12 U/L (ref 3–53)
AST: 15 U/L (ref 5–37)
Albumin: 4.6 g/dL (ref 3.5–5.2)
Alkaline Phosphatase: 46 U/L (ref 39–117)
BUN: 16 mg/dL (ref 6–23)
CO2: 32 meq/L (ref 19–32)
Calcium: 9.3 mg/dL (ref 8.4–10.5)
Chloride: 104 meq/L (ref 96–112)
Creatinine, Ser: 0.95 mg/dL (ref 0.40–1.50)
GFR: 81.76 mL/min
Glucose, Bld: 100 mg/dL — ABNORMAL HIGH (ref 70–99)
Potassium: 4.3 meq/L (ref 3.5–5.1)
Sodium: 139 meq/L (ref 135–145)
Total Bilirubin: 0.7 mg/dL (ref 0.2–1.2)
Total Protein: 6.8 g/dL (ref 6.0–8.3)

## 2024-05-05 LAB — LIPID PANEL
Cholesterol: 73 mg/dL (ref 28–200)
HDL: 47.7 mg/dL
LDL Cholesterol: -7 mg/dL — ABNORMAL LOW (ref 10–99)
NonHDL: 25.08
Total CHOL/HDL Ratio: 2
Triglycerides: 161 mg/dL — ABNORMAL HIGH (ref 10.0–149.0)
VLDL: 32.2 mg/dL (ref 0.0–40.0)

## 2024-05-05 LAB — PSA: PSA: 0.22 ng/mL (ref 0.10–4.00)

## 2024-05-05 NOTE — Progress Notes (Signed)
 "  Established Patient Office Visit   Subjective:  Patient ID: Patrick [name], male    DOB: Feb 07, 1956  Age: 69 y.o. MRN: [id number]  Chief Complaint  Patient presents with   Annual Exam    HPI Encounter Diagnoses  Name Primary?   Healthcare maintenance Yes   Essential hypertension    Coronary artery disease due to lipid rich plaque    Benign prostatic hyperplasia without lower urinary tract symptoms    Prediabetes    Doing well.  Here for a physical.  Ongoing follow-up with cardiology for history of coronary artery disease and elevated LPA.  Continues follow-up for BPH and irritable bladder with urology.  Ongoing follow-up with dermatology.  Not exercising regularly.  Does have regular dental care twice yearly.  Brings in an EKG with normal sinus rhythm.   Review of Systems  Constitutional: Negative.  Negative for chills, diaphoresis, malaise/fatigue and weight loss.  HENT: Negative.    Eyes: Negative.  Negative for blurred vision, double vision, discharge and redness.  Respiratory: Negative.    Cardiovascular: Negative.  Negative for chest pain.  Gastrointestinal:  Negative for abdominal pain.  Genitourinary: Negative.   Musculoskeletal: Negative.  Negative for falls and myalgias.  Skin:  Negative for rash.  Neurological:  Negative for tingling, speech change, loss of consciousness and weakness.  Endo/Heme/Allergies:  Negative for polydipsia.  Psychiatric/Behavioral: Negative.        05/05/2024    8:59 AM 03/28/2024    4:21 PM 05/04/2023    9:36 AM  Depression screen PHQ 2/9  Decreased Interest 0 0 0  Down, Depressed, Hopeless 0 0 0  PHQ - 2 Score 0 0 0  Altered sleeping 0 0 0  Tired, decreased energy 1 3 0  Change in appetite 0 0 0  Feeling bad or failure about yourself  1 0 0  Trouble concentrating 0 0 0  Moving slowly or fidgety/restless 0 0 0  Suicidal thoughts 0 0 0  PHQ-9 Score 2 3 0   Difficult doing work/chores Not difficult at all Not difficult at all  Not difficult at all     Data saved with a previous flowsheet row definition     Current Medications[1]   Objective:     BP 108/70   Pulse 65   Temp 97.9 F (36.6 C)   Ht 6' (1.829 m)   Wt 195 lb 3.2 oz (88.5 kg)   SpO2 96%   BMI 26.47 kg/m    Physical Exam Constitutional:      General: He is not in acute distress.    Appearance: Normal appearance. He is not ill-appearing, toxic-appearing or diaphoretic.  HENT:     Head: Normocephalic and atraumatic.     Right Ear: Tympanic membrane, ear canal and external ear normal.     Left Ear: Tympanic membrane, ear canal and external ear normal.     Mouth/Throat:     Mouth: Mucous membranes are moist.     Pharynx: Oropharynx is clear. No oropharyngeal exudate or posterior oropharyngeal erythema.  Eyes:     General: No scleral icterus.       Right eye: No discharge.        Left eye: No discharge.     Extraocular Movements: Extraocular movements intact.     Conjunctiva/sclera: Conjunctivae normal.     Pupils: Pupils are equal, round, and reactive to light.  Cardiovascular:     Rate and Rhythm: Normal rate and regular rhythm.  Pulmonary:  Effort: Pulmonary effort is normal. No respiratory distress.     Breath sounds: Normal breath sounds. No wheezing or rales.  Abdominal:     General: Bowel sounds are normal.     Tenderness: There is no abdominal tenderness. There is no guarding or rebound.  Musculoskeletal:     Cervical back: No rigidity or tenderness.  Lymphadenopathy:     Cervical: No cervical adenopathy.  Skin:    General: Skin is warm and dry.  Neurological:     Mental Status: He is alert and oriented to person, place, and time.  Psychiatric:        Mood and Affect: Mood normal.        Behavior: Behavior normal.      No results found for any visits on 05/05/24.    The ASCVD Risk score (Arnett DK, et al., 2019) failed to calculate for the following reasons:   Risk score cannot be calculated because patient  has a medical history suggesting prior/existing ASCVD   * - Cholesterol units were assumed    Assessment & Plan:   Healthcare maintenance  Essential hypertension -     CBC with Differential/Platelet -     Comprehensive metabolic panel with GFR -     Urinalysis, Routine w reflex microscopic  Coronary artery disease due to lipid rich plaque -     Comprehensive metabolic panel with GFR -     Lipid panel  Benign prostatic hyperplasia without lower urinary tract symptoms -     PSA  Prediabetes -     Comprehensive metabolic panel with GFR -     Hemoglobin A1c    Return in about 1 year (around 05/05/2025), or Please exercise for 30 minutes most days of the week or 150 minutes weekly..  Recommended regular exercise for at least 30 minutes daily.  Information was given on preventing type 2 diabetes.  Discussed using metformin to prevent the progression to diabetes.  Patient declines for now.  Information given on exercising to stay healthy.  Elsie Sim Lent, MD    [1]  Current Outpatient Medications:    Acetaminophen  (TYLENOL  ARTHRITIS PAIN PO), Take by mouth as needed., Disp: , Rfl:    amLODipine  (NORVASC ) 2.5 MG tablet, TAKE ONE TABLET BY MOUTH ONCE A DAY, Disp: 90 tablet, Rfl: 0   aspirin  EC 81 MG tablet, Take 81 mg by mouth daily. Swallow whole., Disp: , Rfl:    Evolocumab  (REPATHA  SURECLICK) 140 MG/ML SOAJ, Inject 140 mg as directed every 14 (fourteen) days., Disp: 6 mL, Rfl: 3   ezetimibe  (ZETIA ) 10 MG tablet, TAKE ONE TABLET BY MOUTH ONCE A DAY, Disp: 90 tablet, Rfl: 0   finasteride  (PROSCAR ) 5 MG tablet, TAKE ONE TABLET BY MOUTH ONE TIME DAILY, Disp: 90 tablet, Rfl: 0   rosuvastatin  (CRESTOR ) 10 MG tablet, TAKE ONE TABLET BY MOUTH DAILY IN THE EVENING, Disp: 90 tablet, Rfl: 3   tamsulosin  (FLOMAX ) 0.4 MG CAPS capsule, TAKE ONE CAPSULE BY MOUTH ONE TIME DAILY (Patient taking differently: Take 0.4 mg by mouth in the morning and at bedtime.), Disp: 90 capsule, Rfl: 1    gabapentin  (NEURONTIN ) 400 MG capsule, Take 1 capsule (400 mg total) by mouth 4 (four) times daily. (Patient not taking: Reported on 05/05/2024), Disp: 120 capsule, Rfl: 1   nitroGLYCERIN  (NITROSTAT ) 0.4 MG SL tablet, Place 1 tablet (0.4 mg total) under the tongue every 5 (five) minutes as needed for chest pain. (Patient not taking: Reported on 05/05/2024), Disp: 90  tablet, Rfl: 3  "

## 2024-07-07 ENCOUNTER — Encounter

## 2025-04-03 ENCOUNTER — Ambulatory Visit

## 2025-05-24 ENCOUNTER — Encounter: Admitting: Family Medicine
